# Patient Record
Sex: Male | Born: 1935 | Race: Black or African American | Hispanic: No | Smoking: Former smoker
Health system: Southern US, Community
[De-identification: ages and names within clinical notes are randomized; demographics above are authoritative.]

## PROBLEM LIST (undated history)

## (undated) DIAGNOSIS — N200 Calculus of kidney: Secondary | ICD-10-CM

## (undated) DIAGNOSIS — E785 Hyperlipidemia, unspecified: Secondary | ICD-10-CM

## (undated) DIAGNOSIS — N400 Enlarged prostate without lower urinary tract symptoms: Secondary | ICD-10-CM

## (undated) DIAGNOSIS — R0602 Shortness of breath: Secondary | ICD-10-CM

## (undated) DIAGNOSIS — K5790 Diverticulosis of intestine, part unspecified, without perforation or abscess without bleeding: Secondary | ICD-10-CM

## (undated) DIAGNOSIS — I10 Essential (primary) hypertension: Secondary | ICD-10-CM

## (undated) DIAGNOSIS — I209 Angina pectoris, unspecified: Secondary | ICD-10-CM

## (undated) DIAGNOSIS — M1990 Unspecified osteoarthritis, unspecified site: Secondary | ICD-10-CM

## (undated) DIAGNOSIS — I639 Cerebral infarction, unspecified: Secondary | ICD-10-CM

## (undated) DIAGNOSIS — F17200 Nicotine dependence, unspecified, uncomplicated: Secondary | ICD-10-CM

## (undated) DIAGNOSIS — G4730 Sleep apnea, unspecified: Secondary | ICD-10-CM

## (undated) DIAGNOSIS — Z8249 Family history of ischemic heart disease and other diseases of the circulatory system: Secondary | ICD-10-CM

## (undated) DIAGNOSIS — G709 Myoneural disorder, unspecified: Secondary | ICD-10-CM

## (undated) DIAGNOSIS — J449 Chronic obstructive pulmonary disease, unspecified: Secondary | ICD-10-CM

## (undated) DIAGNOSIS — Z9289 Personal history of other medical treatment: Secondary | ICD-10-CM

## (undated) HISTORY — DX: Cerebral infarction, unspecified: I63.9

## (undated) HISTORY — DX: Family history of ischemic heart disease and other diseases of the circulatory system: Z82.49

## (undated) HISTORY — DX: Benign prostatic hyperplasia without lower urinary tract symptoms: N40.0

## (undated) HISTORY — PX: TRANSURETHRAL RESECTION OF PROSTATE: SHX73

## (undated) HISTORY — DX: Nicotine dependence, unspecified, uncomplicated: F17.200

## (undated) HISTORY — DX: Essential (primary) hypertension: I10

## (undated) HISTORY — DX: Hyperlipidemia, unspecified: E78.5

## (undated) HISTORY — DX: Sleep apnea, unspecified: G47.30

## (undated) HISTORY — DX: Personal history of other medical treatment: Z92.89

## (undated) HISTORY — DX: Chronic obstructive pulmonary disease, unspecified: J44.9

## (undated) HISTORY — DX: Diverticulosis of intestine, part unspecified, without perforation or abscess without bleeding: K57.90

## (undated) HISTORY — PX: TONSILLECTOMY: SUR1361

---

## 1996-08-20 HISTORY — PX: TRANSURETHRAL RESECTION OF PROSTATE: SHX73

## 1998-02-08 ENCOUNTER — Ambulatory Visit: Admission: RE | Admit: 1998-02-08 | Discharge: 1998-02-08 | Payer: Self-pay | Admitting: Pulmonary Disease

## 2000-08-02 ENCOUNTER — Encounter: Payer: Self-pay | Admitting: Emergency Medicine

## 2000-08-02 ENCOUNTER — Emergency Department (HOSPITAL_COMMUNITY): Admission: EM | Admit: 2000-08-02 | Discharge: 2000-08-02 | Payer: Self-pay | Admitting: Emergency Medicine

## 2000-08-11 ENCOUNTER — Inpatient Hospital Stay (HOSPITAL_COMMUNITY): Admission: EM | Admit: 2000-08-11 | Discharge: 2000-08-14 | Payer: Self-pay | Admitting: Neurology

## 2000-08-11 ENCOUNTER — Encounter: Payer: Self-pay | Admitting: Emergency Medicine

## 2000-08-11 ENCOUNTER — Encounter (INDEPENDENT_AMBULATORY_CARE_PROVIDER_SITE_OTHER): Payer: Self-pay | Admitting: *Deleted

## 2000-08-11 ENCOUNTER — Encounter: Payer: Self-pay | Admitting: Neurology

## 2000-08-20 ENCOUNTER — Encounter: Admission: RE | Admit: 2000-08-20 | Discharge: 2000-09-01 | Payer: Self-pay | Admitting: Neurology

## 2001-07-04 ENCOUNTER — Ambulatory Visit (HOSPITAL_COMMUNITY): Admission: RE | Admit: 2001-07-04 | Discharge: 2001-07-04 | Payer: Self-pay | Admitting: Gastroenterology

## 2001-07-04 ENCOUNTER — Encounter (INDEPENDENT_AMBULATORY_CARE_PROVIDER_SITE_OTHER): Payer: Self-pay | Admitting: Specialist

## 2002-07-20 HISTORY — PX: TRANSTHORACIC ECHOCARDIOGRAM: SHX275

## 2003-04-06 ENCOUNTER — Ambulatory Visit (HOSPITAL_COMMUNITY): Admission: RE | Admit: 2003-04-06 | Discharge: 2003-04-06 | Payer: Self-pay | Admitting: Family Medicine

## 2003-08-02 ENCOUNTER — Ambulatory Visit (HOSPITAL_COMMUNITY): Admission: RE | Admit: 2003-08-02 | Discharge: 2003-08-02 | Payer: Self-pay | Admitting: Gastroenterology

## 2004-09-01 ENCOUNTER — Encounter: Admission: RE | Admit: 2004-09-01 | Discharge: 2004-09-01 | Payer: Self-pay | Admitting: Family Medicine

## 2006-08-03 ENCOUNTER — Ambulatory Visit: Payer: Self-pay | Admitting: Family Medicine

## 2006-08-25 ENCOUNTER — Emergency Department (HOSPITAL_COMMUNITY): Admission: EM | Admit: 2006-08-25 | Discharge: 2006-08-26 | Payer: Self-pay | Admitting: Emergency Medicine

## 2006-09-08 ENCOUNTER — Ambulatory Visit: Payer: Self-pay | Admitting: Family Medicine

## 2006-09-13 ENCOUNTER — Encounter: Admission: RE | Admit: 2006-09-13 | Discharge: 2006-09-13 | Payer: Self-pay | Admitting: Family Medicine

## 2006-10-14 ENCOUNTER — Ambulatory Visit: Payer: Self-pay | Admitting: Family Medicine

## 2007-07-11 ENCOUNTER — Ambulatory Visit: Payer: Self-pay | Admitting: Family Medicine

## 2007-07-15 ENCOUNTER — Ambulatory Visit: Payer: Self-pay | Admitting: Family Medicine

## 2007-08-01 ENCOUNTER — Ambulatory Visit: Payer: Self-pay | Admitting: Family Medicine

## 2007-08-08 ENCOUNTER — Ambulatory Visit: Payer: Self-pay | Admitting: Family Medicine

## 2007-09-26 ENCOUNTER — Ambulatory Visit: Payer: Self-pay | Admitting: Family Medicine

## 2008-06-07 ENCOUNTER — Ambulatory Visit: Payer: Self-pay | Admitting: Family Medicine

## 2008-09-14 ENCOUNTER — Ambulatory Visit: Payer: Self-pay | Admitting: Family Medicine

## 2008-11-01 ENCOUNTER — Ambulatory Visit: Payer: Self-pay | Admitting: Family Medicine

## 2009-01-31 ENCOUNTER — Ambulatory Visit: Payer: Self-pay | Admitting: Family Medicine

## 2009-02-04 ENCOUNTER — Encounter: Admission: RE | Admit: 2009-02-04 | Discharge: 2009-02-04 | Payer: Self-pay | Admitting: Family Medicine

## 2009-02-18 ENCOUNTER — Ambulatory Visit: Payer: Self-pay | Admitting: Family Medicine

## 2009-02-18 ENCOUNTER — Encounter: Admission: RE | Admit: 2009-02-18 | Discharge: 2009-02-18 | Payer: Self-pay | Admitting: Family Medicine

## 2009-02-19 ENCOUNTER — Encounter: Admission: RE | Admit: 2009-02-19 | Discharge: 2009-02-19 | Payer: Self-pay | Admitting: Family Medicine

## 2009-10-11 ENCOUNTER — Ambulatory Visit: Payer: Self-pay | Admitting: Family Medicine

## 2010-03-20 DIAGNOSIS — Z9289 Personal history of other medical treatment: Secondary | ICD-10-CM | POA: Insufficient documentation

## 2010-03-20 HISTORY — DX: Personal history of other medical treatment: Z92.89

## 2010-06-09 ENCOUNTER — Ambulatory Visit: Payer: Self-pay | Admitting: Family Medicine

## 2010-10-09 ENCOUNTER — Ambulatory Visit: Payer: Medicare Other | Admitting: Family Medicine

## 2010-10-17 ENCOUNTER — Ambulatory Visit (HOSPITAL_COMMUNITY)
Admission: RE | Admit: 2010-10-17 | Discharge: 2010-10-17 | Disposition: A | Discharge status: Home / Self Care | Payer: Medicare Other | Source: Ambulatory Visit | Attending: Cardiology | Admitting: Cardiology

## 2010-10-17 DIAGNOSIS — R0602 Shortness of breath: Secondary | ICD-10-CM | POA: Insufficient documentation

## 2010-10-19 HISTORY — PX: CARDIAC CATHETERIZATION: SHX172

## 2010-10-22 ENCOUNTER — Ambulatory Visit
Admission: RE | Admit: 2010-10-22 | Discharge: 2010-10-22 | Disposition: A | Discharge status: Home / Self Care | Payer: Medicare Other | Source: Ambulatory Visit | Attending: Cardiology | Admitting: Cardiology

## 2010-10-22 ENCOUNTER — Other Ambulatory Visit: Payer: Self-pay | Admitting: Cardiology

## 2010-10-22 DIAGNOSIS — I2510 Atherosclerotic heart disease of native coronary artery without angina pectoris: Secondary | ICD-10-CM

## 2010-10-22 DIAGNOSIS — R0602 Shortness of breath: Secondary | ICD-10-CM

## 2010-10-28 ENCOUNTER — Ambulatory Visit (HOSPITAL_COMMUNITY)
Admission: RE | Admit: 2010-10-28 | Discharge: 2010-10-28 | Disposition: A | Discharge status: Home / Self Care | Payer: Medicare Other | Source: Ambulatory Visit | Attending: Cardiology | Admitting: Cardiology

## 2010-10-28 DIAGNOSIS — N400 Enlarged prostate without lower urinary tract symptoms: Secondary | ICD-10-CM | POA: Insufficient documentation

## 2010-10-28 DIAGNOSIS — J4489 Other specified chronic obstructive pulmonary disease: Secondary | ICD-10-CM | POA: Insufficient documentation

## 2010-10-28 DIAGNOSIS — J449 Chronic obstructive pulmonary disease, unspecified: Secondary | ICD-10-CM | POA: Insufficient documentation

## 2010-10-28 DIAGNOSIS — R9439 Abnormal result of other cardiovascular function study: Secondary | ICD-10-CM | POA: Insufficient documentation

## 2010-10-28 DIAGNOSIS — R079 Chest pain, unspecified: Secondary | ICD-10-CM | POA: Insufficient documentation

## 2010-10-28 DIAGNOSIS — E785 Hyperlipidemia, unspecified: Secondary | ICD-10-CM | POA: Insufficient documentation

## 2010-10-28 DIAGNOSIS — I10 Essential (primary) hypertension: Secondary | ICD-10-CM | POA: Insufficient documentation

## 2010-10-28 LAB — POCT I-STAT 3, VENOUS BLOOD GAS (G3P V)
Acid-base deficit: 1 mmol/L (ref 0.0–2.0)
O2 Saturation: 62 %
pCO2, Ven: 46.8 mmHg (ref 45.0–50.0)

## 2010-10-28 LAB — POCT I-STAT 3, ART BLOOD GAS (G3+)
Acid-base deficit: 2 mmol/L (ref 0.0–2.0)
Bicarbonate: 24.3 mEq/L — ABNORMAL HIGH (ref 20.0–24.0)

## 2010-11-05 NOTE — Cardiovascular Report (Signed)
NAME:  Curtis Riley, VERBA NO.:  [id number]  MEDICAL RECORD NO.:  [id number]           PATIENT TYPE:  O  LOCATION:  [facility]                         FACILITY:  [facility]  PHYSICIAN:  Thereasa Solo. Little, M.D. DATE OF BIRTH:  1934/12/08  DATE OF PROCEDURE:  10/28/2010 DATE OF DISCHARGE:  10/28/2010                           CARDIAC CATHETERIZATION   INDICATIONS FOR TEST:  This 76 year old hypertensive male has a history of significant COPD and has had atypical chest and arm discomfort.  Had a nuclear study performed that was positive for inferior ischemia raising the concern of RCA disease.  He is brought in for outpatient cardiac catheterization and because of his severe hypertension and COPD, a distal aortogram and a right heart cath was performed.  PROCEDURE:  After obtaining informed consent, the patient was prepped and draped in the usual sterile fashion exposing the right groin. Following local anesthetic with 1% Xylocaine the Seldinger technique was employed and a 5-French introducer sheath was placed in the right femoral artery, a 7-French in the right femoral vein.  A Swan-Ganz catheter was then advanced through its normal route into the pulmonary artery with hemodynamic monitoring undertaken throughout each station, cardiac output by thermodilution was then performed.  Oxygen saturations in both the PA and AO were obtained.  A pigtail catheter was then advanced through its normal route into the left ventricular cavity and ventriculography in the RAO projection and then left and right coronary arteriography was performed.  A distal aortogram done at the level of the renal artery was also performed.  EQUIPMENT:  A 5-French diagnostic Judkins configuration catheter, a 7- French Swan-Ganz catheter.  TOTAL CONTRAST:  90 mL.  COMPLICATIONS:  None.  RESULTS: 1. Hemodynamic monitoring:  Right atrial pressure 11, right     ventricular pressure 14/11.  Pulmonary artery  pressure 26/17, wedge     was 11.  Central aortic pressure 120/72.  Left ventricular pressure     119/7.  Left ventricular end-diastolic pressure was 19.  Oxygen     saturations on room air in the AO 90%, in the PA 62%.  Cardiac     output by thermodilution was 4.5 and by Fick 6.1. 2. Ventriculography.  Ventriculography in the RAO projection using 20     mL of contrast at 12 mL per second revealed mild left ventricular     hypertrophy.  There was ectopy during the ventriculogram.  The     ejection fraction appeared to be 50%.  There was some mitral     regurgitation appeared to be related to the ectopy. 3. Distal aortogram:  Distal aortogram done at the level of renal     arteries using 25 mL of contrast at 12 mL per second revealed no     evidence of renal artery stenosis or abdominal aortic aneurysm.  Coronary arteriography:  On fluoroscopy there was no calcification seen. 1. Left main normal and bifurcated. 2. LAD.  The LAD approached the apex of the heart. To the distal     portion of the LAD was a myocardial bridge and the distal LAD was     relatively  small in diameter compared to the proximal portion which     was about 4 mm.  The first and second diagonal system was free of     disease as was the LAD. 3. Circumflex.  The circumflex in the AV groove was small and free of     disease.  There was a very large bifurcating OM system that was     free of disease. 4. Right coronary artery:  The right coronary artery was normal.  It     gave rise to a large PDA and a small posterolateral vessel.  CONCLUSION: 1. Mild LVH. 2. Normal systolic function. 3. Pulmonary artery pressure of 26/17. 4. No occlusive coronary artery disease.  There was a false positive nuclear study that generated this cath.  He will be maintained on medical therapy for his hypertension.  Discharged to home today with follow-up in the office tomorrow.          ______________________________ Thereasa Solo  Little, M.D.     ABL/MEDQ  D:  10/28/2010  T:  10/29/2010  Job:  [id number]  cc:   Sharlot Gowda, M.D.  Electronically Signed by Julieanne Manson M.D. on 11/05/2010 08:24:38 AM

## 2010-12-05 NOTE — Procedures (Signed)
[place]  Patient:    Curtis Riley, Curtis Riley Visit Number: [id number] MRN: [id number]          Service Type: END Location: ENDO Attending Physician:  Rich Brave Dictated by:   Florencia Reasons, M.D. Proc. Date: 07/04/01 Admit Date:  07/04/2001   CC:         Ronnald Nian, M.D.   Procedure Report  PROCEDURE PERFORMED:  Colonoscopy with polypectomy.  ENDOSCOPIST:  Florencia Reasons, M.D.  INDICATIONS FOR PROCEDURE:  The patient is a 76 year old gentleman, one year status post excision of a very large highly dysplastic polyp from the proximal colon.  FINDINGS:  Small polyps in the distal colon, but no evidence of residual or recurrence at previous polypectomy location.  DESCRIPTION OF PROCEDURE:  The nature, purpose and risks of the procedure were familiar to the patient from prior examination and he provided written consent.  Sedation was fentanyl 50 mcg and Versed 4 mg IV without arrhythmias or desaturation.  The Olympus adult video colonoscope was advanced quite easily to the cecum and pull-back was performed.  The site of the previous polypectomy was identified by the presence of [place] ink but careful inspection several times through that area demonstrated no evidence of recurrent or residual polyp tissue.  It appeared that this area was in the region of the hepatic flexure.  In the transverse colon there was a diminutive 3 mm sessile polyp removed by a couple of cold biopsies and in the left colon at about 42 cm, a pedunculated 6 mm hyperplastic appearing polyp was removed by snare technique, initially decapitating the polyp without cautery, then going back and getting residual tissue with cautery.  There was good hemostasis and no evidence of excessive cautery.  No other problems were seen other than a transiently observed 2 mm sessile lesion in the mid colon which could correspond to the above-mentioned polyp which  was cold biopsied or may have been a separate lesion since we temporarily lost sight of it.  There was no evidence of cancer, colitis or vascular malformations nor did I note any obvious diverticular disease.  The patient tolerated the procedure well and there were no apparent complications.  IMPRESSION:  Colon polyps removed as described above.  However, the area of greatest concern, namely the site of the previous polypectomy, appeared to be free of any polypoid tissue.  PLAN:  Await pathology on current biopsies.  Would anticipate follow-up colonoscopy in about a year for continued close monitoring of this patient and then perhaps follow-up could be spaced out to longer intervals depending on how he is doing. Dictated by:   Florencia Reasons, M.D. Attending Physician:  Rich Brave DD:  07/04/01 TD:  07/04/01 Job: [id number]

## 2010-12-05 NOTE — Consult Note (Signed)
[place]  Patient:    CHERYL, STABENOW                      MRN: [id number] Proc. Date: 08/11/00 Adm. Date:  [id number] Disc. Date: [id number] Attending:  Ilene Qua CC:         Marlan Palau, M.D.  Ronnald Nian, M.D.   Consultation Report  Dr. Susann Givens asked me to see this 76 year old African-American male because of heme positive stool and anemia.  Mr. Minniefield came to the emergency room today as he did eight days ago because of severe dizziness forcing him to lie down.  During his evaluation last time and again on this occasion, he was found to be severely anemic and the ER physicians rectal examination today showed the stool to be weakly Hemoccult positive (he was Hemoccult negative when checked by Dr. Susann Givens in his office following the recent ER visit).  From the GI perspective, pertinent to the patients history is the fact that he was seen by me consultatively for Dr. Susann Givens about three and a half years ago in May of 1998 because of several small polyps identified on a screening flexible sigmoidoscopy performed by Dr. Susann Givens.  At that time the patient had had a significant 24 pound weight loss.  Colonoscopy for the purpose of polypectomy was going to be arranged but the patient did not follow through on it as instructed and despite letters urging him to do so, he never got back in touch with [place].  Blood work obtained through Dr. Levonne Spiller office was pertinent for a low ferritin level of 2, a low iron saturation (serum iron was less than 10), a hemoglobin of 7.7, an MCV of 70.5, and an RDW of 21 with a normal platelet count.  The patient is not on any ______ medications such as aspirin or NSAIDs, although he does use occasional Tylenol.  He has no upper or lower tract symptoms.  Several years ago he had significant heartburn but it went away on its own.  He does not have any problem with anorexia or current weight loss to his  knowledge despite the previous history of weight loss nor any symptoms of belching, dysphagia, nor lower tract symptomatology such as melena, hematochezia, constipation, diarrhea, or change in bowel habits.  There is no known family history of colon cancer, although a brother did have ulcers and two of his brothers died with some sort of cancer, type unknown.  In summary, the patient has asymptomatic microcytic severe anemia noted incidentally during evaluation for spells of dizziness and associated with weakly heme positive stool on todays examination, but without any obvious localizing GI tract symptoms or worrisome family history, just his personal history of colon polyps several years ago.  It was initially felt that the patients dizziness was related to his anemia and for that reason the initial request was for me to manage the patients care.  However, during the course of my evaluation of the patient, I noted that the patient was neurologically not intact.  Specifically, his left eye could not cross the midline looking toward the right and this was associated with symptoms of diplopia which was causing the patient to keep his eyes closed.  This, along with his persistent dizziness which had begun around 9:00 last night, made me feel that the heme positive anemia was an incidental problem and that the main reason for the patients complaints of dizziness (further description below)  were related to some sort of neurologic process, perhaps some variation of vertebrobasilar insufficiency or other brain stem problem.  Accordingly, neurologic consultation was obtained and the patient was seen by Dr. Lesia Sago who has kindly agreed to have the patient admitted to the [facility] stroke service (the patient was seen consultatively by himself and by myself at the [facility] where he had presented).  ALLERGIES:  PENICILLIN ("severe reaction" when he was in the  [employer] many years ago characterized by hives and apparently a drop in blood pressure).  MEDICATIONS:  None other than occasional Tylenol and some recent antibiotics for a cold.  PAST SURGICAL HISTORY:  TURP with possible removal of kidney stones several years ago.  PAST MEDICAL HISTORY:  Basically none.  The patient thinks he might have some mild COPD but he is not under treatment for this.  No known coronary disease. No history of ulcers, diabetes, or hypertension.  He does have the history of colon polyps identified on flexible sigmoidoscopy by Dr. Susann Givens several years ago for which the patient never followed through to have them removed.  SOCIAL HISTORY:  The patient currently smokes less than one-half pack per day, but used to smoke as much as perhaps one pack per day since age 45.  He drank moderately in his younger years, but has not had any ethanol for the past 20 years.  The patient lives by himself.  He has a daughter, Doreene Burke, who helps to look after him.  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  He is originally from [place] in the [place].  However, he has lived in other areas, particularly in [place] since getting out of the [employer].  FAMILY HISTORY:  Negative for colon cancer, gallstones, or liver disease. There is peptic ulcer in one of his older brothers and it sounds as though two brothers died of some form of cancer.  His father died of old age, his mother of some sort of postoperative complications in the hospital.  REVIEW OF SYSTEMS:  As noted above, the patient has had a spell of dizziness bringing him to the emergency room eight days ago and again another one that began around 9:00 last night.  He has had occasional spells, perhaps every five or six years which are somewhat similar to this but what was  different  about the current episode is that he developed tunnel vision and also a headache.  The headache is in the vertex of his head.  Associated symptoms include a rushing or ringing sound in his ears somewhat like the ocean heard through a seashell.  These spells come on subacutely.  He has a brief aura that they are coming on which allowed him, for example, to pull his truck into the parking lot eight days ago when he had his last spell.  In between these episodes, he has felt neurologically fairly normal and in fact was back to work yesterday for the first time since the episode eight days ago.  He did feel somewhat weak but this was attributed to his known anemia.  Last night he had some brief chest tightness but it only lasted a couple of minutes and he does not have any exertional chest pain.  HEART:  Negative for palpitations or recurring chest pain.  LUNGS:  He ordinarily has no chronic cough  or severe dyspnea but it is hard for him to go more than three to four flights of stairs without huffing and puffing.  Recently, he had a chest cold with some phlegm production which got better after antibiotics.  ABDOMEN:  Please see the HPI. He is basically asymptomatic for upper or lower tract symptoms.  PHYSICAL EXAMINATION:  GENERAL:  This is a pleasant, talkative, coherent African-American male with his eyes shut lying on the stretcher in no evident distress.  VITAL SIGNS:  Unremarkable without hypotension, but with a significant systolic drop on orthostatic measurements.  However, pulse and diastolic show no significant change.  HEENT:  This is pertinent for disconjugate gaze.  Specifically, there is paresis of extraocular muscle movements (see below under neurologic examination).  He is anicteric.  The oropharynx is benign.  The thyroid gland is normal.  CHEST:  Clear to auscultation.  No wheezes heard.  However, some soft upper airway type wheezes are heard when he  converses.  HEART:  No gallops, rubs, murmurs, clicks, or arrhythmias.  ABDOMEN:  Normal liver span.  No organomegaly, guarding, mass, tenderness, ______.  Moderate tympany.  RECTAL:  This was not performed by me but the emergency room examination showed weakly heme positive stool.  EXTREMITIES:  No edema.  NEUROLOGIC:  The patient is coherent and without dysarthria.  He does heel-to-shin motions normally.  However, cranial nerve deficits are noted in that he has impairment of rightward gaze past the midline in his left eye.  I do not note any definite nystagmus.  Facial nerves and hearing are intact.  LABORATORIES:  White count 7200, hemoglobin 7.8, MCV 62, RDW 18, platelets 402,000.  See above regarding iron studies obtained through Dr. Levonne Spiller office.  CMET entirely normal.  Head CT scan negative.  IMPRESSION: 1. Acute change in neurologic status characterized by dizziness and    extraocular muscle dysfunction raising the question of brain stem or    vertebrobasilar problems. 2. Heme positive stool, microcytic anemia without worrisome gastrointestinal    symptoms or ulcer risk factors. 3. Previous history of colon polyps on sigmoidoscopy not removed    colonoscopically due to lack of patient followup.  PLAN: 1. The patient is being admitted to the stroke service as noted above. 2. When okay with neurology, I would be happy to perform elective GI workup.    There is no evidence of acute GI bleeding to necessitate urgent endoscopic    evaluation. 3. For the same reason, I have no objection to anticoagulation if that is felt    to be needed.  Specifically, the patient has no history of ulcer disease or    current ulcer risk factors (other than mild smoking) and does not show    strongly heme positive stool or evidence of an acute drop in hemoglobin.    Rather, this is a chronic anemia and I worry about occult malignancy,    especially in the colon.  For that reason, I do  not feel the patient would    be at risk for brisk bleeding if he were to undergo anticoagulation.  I appreciate the opportunity to have seen this patient in consultation for you. DD:  08/11/00 TD:  08/11/00 Job: [id number]

## 2010-12-05 NOTE — Discharge Summary (Signed)
[place]. [facility]  Patient:    Curtis Riley, Curtis Riley                      MRN: [id number] Adm. Date:  [id number] Disc. Date: [id number] Attending:  Lesly Dukes CC:         Ronnald Nian, M.D.  Florencia Reasons, M.D.   Discharge Summary  DATE OF BIRTH:  15-Oct-1934  ADMITTING DIAGNOSES: 1. 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 2. History of tobacco abuse. 3. Iron deficiency anemia.  DISCHARGE DIAGNOSES: 1. 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 2. Large colon polyp, status post removal. 3. Iron deficiency anemia presumed secondary to #2. 4. History of tobacco abuse.  CONDITION ON DISCHARGE:  Good.  DISCHARGE DIET:  Low salt, low fat, low cholesterol, and high fiber.  ACTIVITY:  Ad lib.  DISCHARGE MEDICATIONS: 1. Enteric-coated aspirin 325 mg q.d. 2. Multivitamin with iron q.d.  PROCEDURES: 1. MRI of the brain performed August 11, 2000, revealing an acute ischemic    infarct involving the left medial longitudinal fasciculi extending to the    left red nucleus and mild small vessel ischemic-type changes. 2. MRA of the brain performed August 11, 2000, revealing mild narrowing of    left carotid bulb but otherwise no significant lesions. 3. Echocardiogram performed August 12, 2000, revealing mild dilatation of    left ventricle and mildly decreased left systolic function with ejection    fraction of 46% with no focal wall motion abnormalities. 4. Colonoscopy performed August 13, 2000, revealing an approximately 5 cm    polyp in the ascending colon, which was removed, as well as a 1 cm polyp in    the sigmoid area which was removed, an 8 mm polyp at approximately 45 cm    seen but not removed, and scattered diverticula.  Pathology on the polyps    is pending. 5. Esophagogastroduodenoscopy performed August 13, 2000, revealing no    abnormalities.  HOSPITAL COURSE:   Please see admission H&P for full details.  Briefly, this is a 76 year old man who presented to the emergency room with acute onset of diplopia on examination by Dr. Anne Hahn.  At admission, he was found to have a left intranuclear ophthalmoplegia with no other evident neurological abnormalities.  His admission labs revealed anemia thought to be iron deficiency by indices which was previously known.  He was admitted to the hospital and placed on aspirin and Pepcid.  His double-vision improved somewhat during his hospitalization.  Physical therapy was consulted and assisted with great transfers and felt that he could benefit from outpatient therapy.  His gastroenterologist, Dr. Matthias Hughs, was consulted.  Of note, the patient had a history of polyps on sigmoidoscopy in 1998 but did not follow up subsequent colonoscopy.  Due to his anemia and acute stroke, the patient was transfused with two units of packed red blood cells with improvement in his hemoglobin from 7.7 on admission to 9.0 on August 13, 2000.  Because of the question of anemia contributing to his acute stroke, it was felt appropriate to work-up the anemia during his hospitalization; thus he underwent EGD and colonoscopy by Dr. Matthias Hughs on August 13, 2000, with the above results, and it was felt that the large colon polyp was the most likely source of his chronic blood loss and subsequent anemia.  He tolerated the procedures well and on the morning of January 26, had no complaints.  He was given an eye patch for relief of his diplopia, and this helped.  He was deemed stable and was discharged on the morning of August 14, 2000.  He will follow up with his primary physician, Dr. Susann Givens, next week as well as with Dr. Matthias Hughs, as scheduled to follow up on the pathology results for his colonoscopy.  He was given prescriptions for outpatient physical and occupational therapy to assist with gait and high-level balance functions.  He will  follow up with Demetrio Lapping, P.A. and Dr. Kelli Hope at [facility] in 3-4 weeks. DD:  08/14/00 TD:  08/14/00 Job: [id number]

## 2010-12-05 NOTE — Procedures (Signed)
[place]. [facility]  Patient:    Curtis Riley, Curtis Riley                      MRN: [id number] Proc. Date: 08/13/00 Adm. Date:  [id number] Disc. Date: [id number] Attending:  Lesly Dukes CC:         Ronnald Nian, M.D.  Marlan Palau, M.D.   Procedure Report  PROCEDURE PERFORMED:  Colonoscopy with polypectomy.  INDICATIONS:  A 76 year old African-American male with polyps seen on screening flexible sigmoidoscopy by his primary physician about 3-1/2 years ago.  Colonoscopy was arranged but never followed through on by the patient and as a consequence the polyps were never removed.  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  He now is to undergo colonoscopy to look for a source of the anemia in the lower GI tract.  His upper endoscopy was negative.  FINDINGS: Huge polyp removed from the proximal colon.  DESCRIPTION OF PROCEDURE:  The nature purpose and risks of the procedure had been discussed with the patient who provided written consent.  Sedation for his procedure and the upper endoscopy which preceded it total of Fentanyl 50 mcg and Versed 6 mg IV without arrhythmias or desaturation.  The Olympus pediatric adjustable tension video colonoscope was advanced to the cecum following an unremarkable digital rectal exam.  It was fairly easy to reach the cecum.  Pull back was then performed.  The quality of the prep was very good and it was felt that all areas were well seen.  The main finding on this exam was a huge polyp in the proximal ascending colon approximately 4 haustrations above the cecum.  Initially, it appeared this was going to have to be removed surgically but as I examined it, it had a stalk which was somewhat short but not particularly thick and it was felt that the polyp could probably be  removed with reasonable safety.  I therefore injected the stalk with 1 cm of 1:10,000 epinephrine which caused nice blanching and raised a bleb to help "elevate" the stalk.  The polyp was then carved off piece meal, with two or three large pieces and then approximately six smaller pieces.  There was complete hemostasis and no evidence of excessive cautery at the polypectomy site and, when finished there was very little if any residual polyps tissue.  Nearby was a tiny sessile polyp which I elected not to attempt to remove at the present time.  The polyp was extracted out through the rectum using the [facility] retrieval basket in approximately three separate trips around the colon.  There was a much smaller, roughly 8 mm polyp on a short stalk near the splenic flexure at about 45 cm which was seen transiently but unable to be snared due to an eccentric orientation in the colon.  As we torqued the scope and attempted to reposition, we lost site of the polyp and never saw it again so it was never removed.  There was a 1 cm polyp at about 25 cm removed by snare technique.  It was on a short stalk and there was complete hemostasis and no evidence of excessive cautery.  There was moderate scattered diverticulosis around the colon.  The patient tolerated this procedure very well and there were no evident immediate complications.  IMPRESSION: 1. Huge polyp in the proximal ascending colon removed  piece meal as    described above, plus smaller sigmoid polyp snared and smaller polyp    near the splenic flexure not removed. 2. Scattered diverticulosis.  PLAN: 1. The patient will be observed for complications and I have carefully    instructed the patients wife and daughter in thinks to watch for (pain,    fever, dark stools, or evidence of bleeding) and they recognize that these    complications can be delayed. 2. I would maintain the patient on aspirin in view of his recent brain stem     stroke, even though it might slightly increase his risk of post    polypectomy bleeding.  The patients family understands this. 3. The patient will need a relatively early follow up colonoscopy, probably    about six months from now. 4. Await pathology on the polyp sections. DD:  08/13/00 TD:  08/14/00 Job: [id number]

## 2010-12-05 NOTE — Op Note (Signed)
NAME:  Curtis Riley, Curtis Riley                         ACCOUNT NO.:  [id number]   MEDICAL RECORD NO.:  [id number]                   PATIENT TYPE:  AMB   LOCATION:  ENDO                                 FACILITY:  [facility]   PHYSICIAN:  Bernette Redbird, M.D.                DATE OF BIRTH:  [birth year]   DATE OF PROCEDURE:  08/02/2003  DATE OF DISCHARGE:                                 OPERATIVE REPORT   PROCEDURE:  Colonoscopy.   INDICATION:  This is a 76 year old gentleman who is about three years status  post colonoscopic removal of a large highly dysplastic polyp from the  ascending colon.  Surveillance colonoscopy a year later (two years ago)  showed a couple of small adenomas in the colon, but no evidence of local  recurrence of the polyp at the previous polypectomy location.   FINDINGS:  Right-sided diverticulosis, otherwise normal exam.   PROCEDURE:  The nature, purpose, and risks of the procedure were familiar to  the patient who provided written consent.  Sedation was Fentanyl 50 mcg and  Versed 5 mg IV without arrhythmias or desaturation.   The Olympus adult video-colonoscope was readily advanced to the cecum and  the tip was nubbed into the orifice of the terminal ileum, although the TI  was not freely cannulated.  Pullback was then performed.   The polypectomy site from before had been tattooed and the area was readily  apparent.  The entire area was carefully checked, but no evidence of  residual polyp tissue could be seen.  There were no polyps anywhere in the  colon.  No evidence of cancer, colitis, or vascular malformations, but there  was a little bit of right-sided diverticulosis.  Retroflexion in the rectum  was unremarkable.   This was a normal examination.  No polyps, cancer, vascular malformations,  or diverticulosis were noted.  Retroflexion in the rectum was normal.  Re-  inspection of the rectum was normal.  No biopsies were obtained.  The  patient tolerated the  procedure well and there were no apparent  complications.   No biopsies were obtained.  The patient tolerated the procedure well and  there were no apparent complications.   IMPRESSION:  1. Prior history of large, highly dysplastic colonic adenoma, without     recurrence at this time (V12.72).  2. Minimal right-sided diverticulosis.   PLAN:  Followup colonoscopy in 2 years with anticipated increase in  subsequent intervals if that exam is negative.                                               Bernette Redbird, M.D.    RB/MEDQ  D:  08/02/2003  T:  08/02/2003  Job:  [id number]   cc:   Sharlot Gowda, M.D.  [address]  [place], [place] [address]  Fax: [phone]

## 2010-12-05 NOTE — H&P (Signed)
[place]. [facility]  Patient:    Curtis Riley, Curtis Riley                      MRN: [id number] Adm. Date:  [id number] Disc. Date: [id number] Attending:  Ilene Qua CC:         Ronnald Nian, M.D.  Florencia Reasons, M.D.  [facility], [address], [place], [place]   History and Physical  CHIEF COMPLAINT: This patient is an admission neurology patient seen at [facility] Emergency Room for evaluation for double vision on August 11, 2000.  HISTORY OF PRESENT ILLNESS: Curtis Riley is a 76 year old left handed black male, born 1934-10-05, with a history of tobacco abuse and COPD.  This patient presents with a history of anemia, seen by Dr. Matthias Hughs, and has had reduction in hemoglobin from 8.1 to 7.8 range at this point.  MCV is low, suggesting chronic component to the anemia.  The patient reports an episode that occurred eight days ago, associated with some headache and tinnitus, tunnel vision temporarily, and then onset of double vision.  With the double vision was accompanying gait instability that lasted up to about 1-1/2 days after the event, with clearing at that point.  The patient reported no other associated symptoms such as focal numbness or weakness noted of the face, arms, or legs, slurred speech, or loss of consciousness.  The patient had a recurrence of this same type spell last night around 8:30 p.m. after eating dinner.  The patient states he was watching television and had onset of headache, double vision, tunnel vision, and had tinnitus.  Later on the patient experienced nausea and vomiting.  The headache has disappeared but the patient has had retention of double vision and gait instability problems. During evaluation today in the emergency room the patient was noted to have diversion of gaze and a CT scan of the head was ordered, and this showed no acute changes.  This was done without contrast only.   The neurology service was asked to see this patient for further evaluation at this point.  PAST MEDICAL HISTORY:  1. New onset of left inner nuclear ophthalmoplegia by examination, rule out     small vessel disease involving the brain stem and pons or mid brain; rule     out vertebrobasilar insufficiency.  2. Status post TURP for benign prostatic hypertrophy.  3. History of anemia, probable lower GI bleed.  4. History of bladder stones, status post resection.  5. History of COPD, with tobacco abuse.  CURRENT MEDICATIONS: Multivitamin with iron.  ALLERGIES: PENICILLIN.  SOCIAL HISTORY: The patient smokes half a packs of cigarettes per day and does not drink alcohol.  [place] and is divorced, and has two children.  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  FAMILY HISTORY: His mother died following surgery, postoperative death.  His father died of old age.  The patient has 11 brothers and sisters - seven are alive and four have passed away.  One sister with cancer, one sister with diabetes, two brothers with cancer of some sort.  REVIEW OF SYSTEMS: Notable for no recent problems with headaches until the above noted events.  The patient has had some fevers off and on with cold-like symptoms the last couple of weeks and the patient has recently been on some antibiotics.  The patient has had no chest pain.  He did have some nausea and vomiting last evening.  He denies problems controlling bowel or bladder and denies any confusion episodes, blacking out episodes, or seizures.  The gait problems seem to correlate with the double vision problem.  PHYSICAL EXAMINATION:  VITAL SIGNS: Blood pressure 124/89l, heart rate 78, respiratory rate.  The patient is afebrile.  GENERAL: This patient is a fairly well-developed black male who is alert and cooperative at the time of examination.  HEENT: Head atraumatic.  PERRL.   Discs are flat bilaterally.  NECK: Supple.  No carotid bruits noted.  RESPIRATORY: Examination clear to auscultation and percussion.  CARDIOVASCULAR: Examination reveals a regular rate and rhythm without obvious murmurs or rubs noted.  ABDOMEN: Examination reveals positive bowel sounds.  No organomegaly or tenderness is noted.  EXTREMITIES: Without significant edema.  NEUROLOGIC: Cranial nerves as above.  Facial symmetry is present.  The patient has good sensation of the face to pinprick and soft touch bilaterally.  The patient has clear evidence of diverted gaze with evidence of a nearly complete left inner nuclear ophthalmoplegia.  The patient has some downbeat nystagmus at times as well.  The patient has a normal speech pattern with no aphasia or dysarthria.  The tongue is midline.  Positive gag reflex is noted.  Motor testing reveals 5/5 strength of all four extremities.  Good symmetric motor tone is noted throughout.  Sensory testing is intact to pinprick, soft touch, and vibratory sensation throughout.  Deep tendon reflexes are present and symmetric throughout.  The toes are neutral bilaterally.  The patient has fair finger-to-nose and finger-to-toe testing bilaterally.  He is not ambulated.  LABORATORY DATA: CT scan of the head is unremarkable without contrast.  Laboratory values are notable for a WBC of 7.2, hemoglobin 7.8, hematocrit 25.9; MCV 52.0; platelets 402,000.  Sodium was 143, potassium 4.3, chloride 109, CO2 29, glucose 100, BUN 9, creatinine 1.0.  Calcium was 9.3, total protein 7.1, albumin 3.6, AST 19, ALT 12, alkaline phosphatase 75, bilirubin 0.6.  EKG and chest x-ray are pending.  IMPRESSION:  1. New onset of intranuclear ophthalmoplegia on the left, probable small     vessel pons or mid brain stroke.  2. History of tobacco abuse.  Given this patients history of some generalized visual loss as well as  dizziness and nausea and vomiting we do need to  also consider the possibility of vertebrobasilar insufficiency.  The patients examination, however, supports what appears to be a very small brain stem stroke, likely a small vessel ischemic event.  Other than the inner internuclear ophthalmoplegia, this patients examination is essentially unremarkable.  We will need to rule out vertebrobasilar insufficiency, however.  PLAN:  1. Admit this patient to the stroke service at [facility].  2. Aspirin therapy for now.  Dr. Matthias Hughs has indicated that heparin can be     used if needed.  3. MRI scan of the brain.  4. MR angiogram.  5. Obtain 2D echocardiogram.  6. Physical therapy for gait.  7. Transfusion of two units of packed red blood cells.  8. Will follow clinical course while in-house. DD:  08/11/00 TD:  08/11/00 Job: [id number]

## 2010-12-05 NOTE — Procedures (Signed)
[place]. [facility]  Patient:    Curtis Riley, Curtis Riley                      MRN: [id number] Proc. Date: 08/13/00 Adm. Date:  [id number] Disc. Date: [id number] Attending:  Lesly Dukes CC:         Ronnald Nian, M.D.  Marlan Palau, M.D.   Procedure Report  PROCEDURE PERFORMED:  Upper endoscopy  INDICATIONS:  A 76 year old male with hemoccult positive stool and iron deficiency anemia.  FINDINGS:  Normal exam.  CONSENT:  The nature, purpose and risks of the procedure had been discussed with the patient who provided written consent.  SEDATION:  Fentanyl 50 mcg and Versed 5 mg IV without arrhythmias or desaturation.  DESCRIPTION OF PROCEDURE:  The Olympus small caliber adult video endoscope was passed under direct vision.  The larynx looked grossly normal.  The esophagus was easily entered and was endoscopically normal without evidence of reflux esophagitis, Barretts esophagus, varices, infection or neoplasia. No ring, stricture or hiatal hernia was appreciated.  The stomach was entered and contained no significant residual.  The mucosa was normal, without evidence of gastritis, erosions, ulcers, polyps or masses.  The pylorus, duodenal bulb and second duodenum looked normal.  The scope was removed from the patient who tolerated the procedure well. There were no apparent complications.  IMPRESSION:  Normal endoscopy.  No source of iron deficiency anemia or hemoccult positive stool endoscopically evident.  PLAN:  Proceed to colonoscopic evaluation. DD:  08/13/00 TD:  08/14/00 Job: [id number]

## 2011-03-24 ENCOUNTER — Encounter: Payer: Self-pay | Admitting: Family Medicine

## 2011-03-26 ENCOUNTER — Ambulatory Visit (INDEPENDENT_AMBULATORY_CARE_PROVIDER_SITE_OTHER): Payer: Medicare Other | Admitting: Family Medicine

## 2011-03-26 ENCOUNTER — Encounter: Payer: Self-pay | Admitting: Family Medicine

## 2011-03-26 VITALS — BP 130/80 | HR 74 | Wt 259.0 lb

## 2011-03-26 DIAGNOSIS — R0602 Shortness of breath: Secondary | ICD-10-CM

## 2011-03-26 DIAGNOSIS — Z79899 Other long term (current) drug therapy: Secondary | ICD-10-CM

## 2011-03-26 DIAGNOSIS — J449 Chronic obstructive pulmonary disease, unspecified: Secondary | ICD-10-CM

## 2011-03-26 DIAGNOSIS — I10 Essential (primary) hypertension: Secondary | ICD-10-CM

## 2011-03-26 LAB — CBC WITH DIFFERENTIAL/PLATELET
Eosinophils Relative: 2 % (ref 0–5)
HCT: 43.9 % (ref 39.0–52.0)
Hemoglobin: 14.1 g/dL (ref 13.0–17.0)
MCH: 30.9 pg (ref 26.0–34.0)
MCHC: 32.1 g/dL (ref 30.0–36.0)
MCV: 96.3 fL (ref 78.0–100.0)
Neutrophils Relative %: 66 % (ref 43–77)
Platelets: 185 10*3/uL (ref 150–400)
RBC: 4.56 MIL/uL (ref 4.22–5.81)
RDW: 12.6 % (ref 11.5–15.5)

## 2011-03-26 LAB — COMPREHENSIVE METABOLIC PANEL
BUN: 11 mg/dL (ref 6–23)
Chloride: 105 mEq/L (ref 96–112)
Creat: 1.2 mg/dL (ref 0.50–1.35)
Potassium: 4.5 mEq/L (ref 3.5–5.3)
Sodium: 140 mEq/L (ref 135–145)

## 2011-03-26 NOTE — Progress Notes (Signed)
  Subjective:    Patient ID: Curtis Riley, male    DOB: [birth year], 76 y.o.   MRN: [id number]  HPI He is here for evaluation of worsening difficulty with breathing over the last several months. He did note that heat made it worse. He then started walking earlier in the morning but noted that he, without vigorous physical activity he would become more short of breath with some wheezing but no chest pain. He notes peripheral edema as well as PND. He was seen by cardiology with a catheterization done in April of this year. He continues on Combivent. He made an appointment to see Dr. Clarene Duke on September 20   Review of Systems     Objective:   Physical Exam Alert and in no distress. Cardiac exam shows regular rhythm without murmurs or gallops. Lungs are clear to auscultation. ME exam shows minimal edema       Assessment & Plan:  Shortness of breath with PND; possible cardiac origin. EKG, blood work, chest x-ray. Flu shot was offered however he refused

## 2011-03-27 ENCOUNTER — Telehealth: Payer: Self-pay

## 2011-03-27 ENCOUNTER — Ambulatory Visit
Admission: RE | Admit: 2011-03-27 | Discharge: 2011-03-27 | Disposition: A | Discharge status: Home / Self Care | Payer: Medicare Other | Source: Ambulatory Visit | Attending: Family Medicine | Admitting: Family Medicine

## 2011-03-27 DIAGNOSIS — R0602 Shortness of breath: Secondary | ICD-10-CM

## 2011-03-27 NOTE — Telephone Encounter (Signed)
Called pt know labs ok and faxed info to Dr.Little

## 2011-03-27 NOTE — Telephone Encounter (Signed)
Left message x ray ok

## 2011-05-12 ENCOUNTER — Other Ambulatory Visit: Payer: Self-pay | Admitting: Gastroenterology

## 2011-06-04 LAB — HM COLONOSCOPY

## 2011-08-04 ENCOUNTER — Ambulatory Visit (INDEPENDENT_AMBULATORY_CARE_PROVIDER_SITE_OTHER): Payer: Medicare Other | Admitting: Family Medicine

## 2011-08-04 ENCOUNTER — Encounter: Payer: Self-pay | Admitting: Family Medicine

## 2011-08-04 VITALS — BP 130/88 | HR 76 | Wt 258.0 lb

## 2011-08-04 DIAGNOSIS — Z23 Encounter for immunization: Secondary | ICD-10-CM

## 2011-08-04 DIAGNOSIS — M549 Dorsalgia, unspecified: Secondary | ICD-10-CM

## 2011-08-04 DIAGNOSIS — G8929 Other chronic pain: Secondary | ICD-10-CM

## 2011-08-04 NOTE — Progress Notes (Signed)
  Subjective:    Patient ID: Curtis Riley, male    DOB: [birth year], 77 y.o.   MRN: [id number]  HPI He is here for consult concerning continued difficulty with back pain. He has had difficulty since the 1970s and is being followed at the [place]. He has had a workup including MRI which did show some nonspecific changes. They recommended either injections or surgery. Apparently the back pain is intermittent and at this time is responding to over-the-counter medications.   Review of Systems     Objective:   Physical Exam Alert and in no distress otherwise not examined       Assessment & Plan:   1. Chronic back pain    over 15 minutes was spent discussing back pain therapy with him. I recommended he continue to treat his pain with over-the-counter medications and if no benefit, try epidural injections and definitely at this time spoke against any surgical intervention. He is comfortable with that approach.

## 2011-09-15 ENCOUNTER — Ambulatory Visit (INDEPENDENT_AMBULATORY_CARE_PROVIDER_SITE_OTHER): Payer: Medicare Other | Admitting: Family Medicine

## 2011-09-15 ENCOUNTER — Encounter: Payer: Self-pay | Admitting: Family Medicine

## 2011-09-15 VITALS — BP 142/88 | HR 58 | Ht 72.0 in | Wt 254.0 lb

## 2011-09-15 DIAGNOSIS — M542 Cervicalgia: Secondary | ICD-10-CM

## 2011-09-15 NOTE — Progress Notes (Signed)
  Subjective:    Patient ID: Curtis Riley, male    DOB: 06-Sep-1934, 77 y.o.   MRN: [id number]  HPI He is here for evaluation of difficulty with his left shoulder. He states that he is unable to lift his left shoulder. He has been complaining of neck pain recently but no history of injury. He does have a previous history of cervical disc disease. An MRI was done in 2010. He was sent for physical therapy which did help.   Review of Systems     Objective:   Physical Exam Good motion of his neck with no tenderness to palpation. Deltoid muscle on the left is quite weak. Biceps testing is also weak. Normal motion of the shoulder.       Assessment & Plan:  Neck pain with neurologic deficit. An MRI will be ordered.

## 2011-09-19 ENCOUNTER — Ambulatory Visit
Admission: RE | Admit: 2011-09-19 | Discharge: 2011-09-19 | Disposition: A | Discharge status: Home / Self Care | Payer: Medicare Other | Source: Ambulatory Visit | Attending: Family Medicine | Admitting: Family Medicine

## 2011-09-19 DIAGNOSIS — M5030 Other cervical disc degeneration, unspecified cervical region: Secondary | ICD-10-CM | POA: Diagnosis not present

## 2011-09-19 DIAGNOSIS — M47812 Spondylosis without myelopathy or radiculopathy, cervical region: Secondary | ICD-10-CM | POA: Diagnosis not present

## 2011-09-19 DIAGNOSIS — M542 Cervicalgia: Secondary | ICD-10-CM

## 2011-09-19 DIAGNOSIS — M5020 Other cervical disc displacement, unspecified cervical region: Secondary | ICD-10-CM | POA: Diagnosis not present

## 2011-09-25 ENCOUNTER — Telehealth: Payer: Self-pay | Admitting: Internal Medicine

## 2011-09-25 DIAGNOSIS — M5000 Cervical disc disorder with myelopathy, unspecified cervical region: Secondary | ICD-10-CM | POA: Diagnosis not present

## 2011-09-28 DIAGNOSIS — M5000 Cervical disc disorder with myelopathy, unspecified cervical region: Secondary | ICD-10-CM | POA: Diagnosis not present

## 2011-09-29 DIAGNOSIS — G4733 Obstructive sleep apnea (adult) (pediatric): Secondary | ICD-10-CM | POA: Diagnosis not present

## 2011-09-29 DIAGNOSIS — Z01810 Encounter for preprocedural cardiovascular examination: Secondary | ICD-10-CM | POA: Diagnosis not present

## 2011-09-29 DIAGNOSIS — I10 Essential (primary) hypertension: Secondary | ICD-10-CM | POA: Diagnosis not present

## 2011-09-29 DIAGNOSIS — J449 Chronic obstructive pulmonary disease, unspecified: Secondary | ICD-10-CM | POA: Diagnosis not present

## 2011-09-30 ENCOUNTER — Encounter (HOSPITAL_COMMUNITY)
Admission: RE | Admit: 2011-09-30 | Discharge: 2011-09-30 | Disposition: A | Discharge status: Home / Self Care | Payer: Medicare Other | Source: Ambulatory Visit | Attending: Neurosurgery | Admitting: Neurosurgery

## 2011-09-30 ENCOUNTER — Encounter (HOSPITAL_COMMUNITY): Payer: Self-pay

## 2011-09-30 HISTORY — DX: Unspecified osteoarthritis, unspecified site: M19.90

## 2011-09-30 HISTORY — DX: Shortness of breath: R06.02

## 2011-09-30 HISTORY — DX: Angina pectoris, unspecified: I20.9

## 2011-09-30 HISTORY — DX: Myoneural disorder, unspecified: G70.9

## 2011-09-30 HISTORY — DX: Calculus of kidney: N20.0

## 2011-09-30 LAB — CBC
HCT: 44.1 % (ref 39.0–52.0)
MCHC: 33.1 g/dL (ref 30.0–36.0)
RDW: 12 % (ref 11.5–15.5)

## 2011-09-30 LAB — BASIC METABOLIC PANEL
BUN: 10 mg/dL (ref 6–23)
Chloride: 107 mEq/L (ref 96–112)
Creatinine, Ser: 0.99 mg/dL (ref 0.50–1.35)
GFR calc Af Amer: >90 mL/min (ref >90)

## 2011-09-30 LAB — SURGICAL PCR SCREEN: MRSA, PCR: NEGATIVE

## 2011-09-30 MED ORDER — VANCOMYCIN HCL 500 MG IV SOLR: 500.0000 mg | INTRAVENOUS | Status: DC

## 2011-09-30 MED ORDER — TOBRAMYCIN SULFATE 80 MG/2ML IJ SOLN: 80.0000 mg | Freq: Once | INTRAVENOUS | Status: DC

## 2011-09-30 MED ORDER — DIPHENHYDRAMINE HCL 50 MG/ML IJ SOLN: 50.0000 mg | Freq: Once | INTRAMUSCULAR | Status: DC

## 2011-09-30 MED ORDER — DEXAMETHASONE SODIUM PHOSPHATE 10 MG/ML IJ SOLN: 10.0000 mg | Freq: Once | INTRAMUSCULAR | Status: DC

## 2011-09-30 NOTE — Pre-Procedure Instructions (Signed)
20 Curtis Riley  09/30/2011   Your procedure is scheduled on:  10/06/2011 Tuesday  Report to [facility] at 1019 AM.  Call this number if you have problems the morning of surgery: [phone]   Remember:   Do not eat food:After Midnight.  May have clear liquids: up to 4 Hours before arrival.DO NOT DRINK ANYTHING AFTER 0600 THE MORNING OF SURGERY.   Clear liquids include soda, tea, black coffee, apple or grape juice, broth.  Take these medicines the morning of surgery with A SIP OF WATER: ALBUTEROL INHALER AND BRING WITH YOU TO THE HOSPITAL.   CARVEDILOL.     Do not wear jewelry, make-up or nail polish.  Do not wear lotions, powders, or perfumes. You may wear deodorant.  Do not shave 48 hours prior to surgery.  Do not bring valuables to the hospital.  Contacts, dentures or bridgework may not be worn into surgery.  Leave suitcase in the car. After surgery it may be brought to your room.  For patients admitted to the hospital, checkout time is 11:00 AM the day of discharge.   Patients discharged the day of surgery will not be allowed to drive home.  Name and phone number of your driver: [phone]  Special Instructions: CHG Shower Use Special Wash: 1/2 bottle night before surgery and 1/2 bottle morning of surgery.   Please read over the following fact sheets that you were given: Pain Booklet, Coughing and Deep Breathing, MRSA Information and Surgical Site Infection Prevention

## 2011-09-30 NOTE — Progress Notes (Signed)
SPOKE WITH LYNN -MED REC AT DR LITTLES OFFICE... REQUESTED OV  EKG 09/29/11 AND STRESS TEST 2012 .

## 2011-10-01 NOTE — Progress Notes (Addendum)
Spoke with Wynona Canes in Medical Records at [facility] cardiology to rerequest most recent office not, EKG and any tests.   Notes received and placed on chart. Clearance note included.

## 2011-10-02 NOTE — Telephone Encounter (Signed)
sl

## 2011-10-05 ENCOUNTER — Encounter (HOSPITAL_COMMUNITY): Payer: Self-pay | Admitting: Pharmacy Technician

## 2011-10-06 ENCOUNTER — Encounter (HOSPITAL_COMMUNITY)
Admission: RE | Disposition: A | Discharge status: Home / Self Care | Payer: Self-pay | Source: Ambulatory Visit | Attending: Neurosurgery

## 2011-10-06 ENCOUNTER — Encounter (HOSPITAL_COMMUNITY): Payer: Self-pay | Admitting: Certified Registered"

## 2011-10-06 ENCOUNTER — Inpatient Hospital Stay (HOSPITAL_COMMUNITY): Payer: Medicare Other | Admitting: Certified Registered"

## 2011-10-06 ENCOUNTER — Encounter (HOSPITAL_COMMUNITY): Payer: Self-pay | Admitting: Surgery

## 2011-10-06 ENCOUNTER — Inpatient Hospital Stay (HOSPITAL_COMMUNITY): Payer: Medicare Other

## 2011-10-06 ENCOUNTER — Inpatient Hospital Stay (HOSPITAL_COMMUNITY)
Admission: RE | Admit: 2011-10-06 | Discharge: 2011-10-07 | DRG: 473 | Disposition: A | Discharge status: Home / Self Care | Payer: Medicare Other | Source: Ambulatory Visit | Attending: Neurosurgery | Admitting: Neurosurgery

## 2011-10-06 DIAGNOSIS — I10 Essential (primary) hypertension: Secondary | ICD-10-CM | POA: Diagnosis present

## 2011-10-06 DIAGNOSIS — M5020 Other cervical disc displacement, unspecified cervical region: Secondary | ICD-10-CM | POA: Diagnosis present

## 2011-10-06 DIAGNOSIS — Z87442 Personal history of urinary calculi: Secondary | ICD-10-CM | POA: Diagnosis not present

## 2011-10-06 DIAGNOSIS — Z8673 Personal history of transient ischemic attack (TIA), and cerebral infarction without residual deficits: Secondary | ICD-10-CM | POA: Diagnosis not present

## 2011-10-06 DIAGNOSIS — Z880 Allergy status to penicillin: Secondary | ICD-10-CM

## 2011-10-06 DIAGNOSIS — G4730 Sleep apnea, unspecified: Secondary | ICD-10-CM | POA: Diagnosis present

## 2011-10-06 DIAGNOSIS — Z87891 Personal history of nicotine dependence: Secondary | ICD-10-CM | POA: Diagnosis not present

## 2011-10-06 DIAGNOSIS — M129 Arthropathy, unspecified: Secondary | ICD-10-CM | POA: Diagnosis present

## 2011-10-06 DIAGNOSIS — J449 Chronic obstructive pulmonary disease, unspecified: Secondary | ICD-10-CM | POA: Diagnosis present

## 2011-10-06 DIAGNOSIS — Z01812 Encounter for preprocedural laboratory examination: Secondary | ICD-10-CM | POA: Diagnosis not present

## 2011-10-06 DIAGNOSIS — M542 Cervicalgia: Secondary | ICD-10-CM | POA: Diagnosis not present

## 2011-10-06 DIAGNOSIS — Z87892 Personal history of anaphylaxis: Secondary | ICD-10-CM

## 2011-10-06 DIAGNOSIS — M47812 Spondylosis without myelopathy or radiculopathy, cervical region: Principal | ICD-10-CM | POA: Diagnosis present

## 2011-10-06 DIAGNOSIS — J4489 Other specified chronic obstructive pulmonary disease: Secondary | ICD-10-CM | POA: Diagnosis present

## 2011-10-06 DIAGNOSIS — N400 Enlarged prostate without lower urinary tract symptoms: Secondary | ICD-10-CM | POA: Diagnosis present

## 2011-10-06 DIAGNOSIS — M4802 Spinal stenosis, cervical region: Secondary | ICD-10-CM

## 2011-10-06 HISTORY — PX: ANTERIOR CERVICAL DECOMP/DISCECTOMY FUSION: SHX1161

## 2011-10-06 SURGERY — ANTERIOR CERVICAL DECOMPRESSION/DISCECTOMY FUSION 3 LEVELS
Anesthesia: General | Site: Spine Cervical | Laterality: Bilateral | Wound class: Clean

## 2011-10-06 MED ORDER — LACTATED RINGERS IV SOLN
INTRAVENOUS | Status: DC | PRN
  Administered 2011-10-06 (×2): via INTRAVENOUS

## 2011-10-06 MED ORDER — PHENOL 1.4 % MT LIQD: 1.0000 | OROMUCOSAL | Status: DC | PRN

## 2011-10-06 MED ORDER — SODIUM CHLORIDE 0.9 % IV SOLN
INTRAVENOUS | Status: DC | PRN
  Administered 2011-10-06: 11:00:00 via INTRAVENOUS

## 2011-10-06 MED ORDER — HYDROCODONE-ACETAMINOPHEN 5-325 MG PO TABS
1.0000 | ORAL_TABLET | ORAL | Status: DC | PRN
  Administered 2011-10-06: 2 via ORAL
  Filled 2011-10-06: qty 2

## 2011-10-06 MED ORDER — SODIUM CHLORIDE 0.9 % IJ SOLN: 3.0000 mL | INTRAMUSCULAR | Status: DC | PRN

## 2011-10-06 MED ORDER — ACETAMINOPHEN 325 MG PO TABS: 650.0000 mg | ORAL_TABLET | ORAL | Status: DC | PRN

## 2011-10-06 MED ORDER — PROPOFOL 10 MG/ML IV EMUL
INTRAVENOUS | Status: DC | PRN
  Administered 2011-10-06: 150 mg via INTRAVENOUS

## 2011-10-06 MED ORDER — VANCOMYCIN HCL 1000 MG IV SOLR
1500.0000 mg | INTRAVENOUS | Status: AC
  Administered 2011-10-06: 1500 mg via INTRAVENOUS
  Filled 2011-10-06: qty 1500

## 2011-10-06 MED ORDER — SODIUM CHLORIDE 0.9 % IJ SOLN
3.0000 mL | Freq: Two times a day (BID) | INTRAMUSCULAR | Status: DC
  Administered 2011-10-06 – 2011-10-07 (×2): 3 mL via INTRAVENOUS

## 2011-10-06 MED ORDER — DEXAMETHASONE SODIUM PHOSPHATE 4 MG/ML IJ SOLN: 4.0000 mg | Freq: Four times a day (QID) | INTRAMUSCULAR | Status: AC

## 2011-10-06 MED ORDER — HYDROMORPHONE HCL PF 1 MG/ML IJ SOLN: 1.0000 mg | INTRAMUSCULAR | Status: DC | PRN

## 2011-10-06 MED ORDER — TOBRAMYCIN SULFATE 80 MG/2ML IJ SOLN
80.0000 mg | INTRAVENOUS | Status: AC
  Administered 2011-10-06: 80 mg via INTRAVENOUS
  Filled 2011-10-06: qty 2

## 2011-10-06 MED ORDER — DEXAMETHASONE 4 MG PO TABS
4.0000 mg | ORAL_TABLET | Freq: Four times a day (QID) | ORAL | Status: AC
  Administered 2011-10-06 (×2): 4 mg via ORAL
  Filled 2011-10-06 (×2): qty 1

## 2011-10-06 MED ORDER — DIPHENHYDRAMINE HCL 50 MG/ML IJ SOLN
INTRAMUSCULAR | Status: AC
  Administered 2011-10-06: 50 mg via INTRAVENOUS
  Filled 2011-10-06: qty 1

## 2011-10-06 MED ORDER — SODIUM CHLORIDE 0.9 % IV SOLN
INTRAVENOUS | Status: AC
  Filled 2011-10-06: qty 500

## 2011-10-06 MED ORDER — ONDANSETRON HCL 4 MG/2ML IJ SOLN: 4.0000 mg | INTRAMUSCULAR | Status: DC | PRN

## 2011-10-06 MED ORDER — GLYCOPYRROLATE 0.2 MG/ML IJ SOLN
INTRAMUSCULAR | Status: DC | PRN
  Administered 2011-10-06: .6 mg via INTRAVENOUS

## 2011-10-06 MED ORDER — BACITRACIN 50000 UNITS IM SOLR
INTRAMUSCULAR | Status: AC
  Filled 2011-10-06: qty 1

## 2011-10-06 MED ORDER — KCL IN DEXTROSE-NACL 20-5-0.45 MEQ/L-%-% IV SOLN
80.0000 mL/h | INTRAVENOUS | Status: DC
  Filled 2011-10-06 (×3): qty 1000

## 2011-10-06 MED ORDER — HYDROMORPHONE HCL PF 1 MG/ML IJ SOLN: 0.2500 mg | INTRAMUSCULAR | Status: DC | PRN

## 2011-10-06 MED ORDER — ACETAMINOPHEN 650 MG RE SUPP: 650.0000 mg | RECTAL | Status: DC | PRN

## 2011-10-06 MED ORDER — MIDAZOLAM HCL 5 MG/5ML IJ SOLN
INTRAMUSCULAR | Status: DC | PRN
  Administered 2011-10-06: 1 mg via INTRAVENOUS

## 2011-10-06 MED ORDER — CARVEDILOL 12.5 MG PO TABS
12.5000 mg | ORAL_TABLET | Freq: Two times a day (BID) | ORAL | Status: DC
  Administered 2011-10-06 – 2011-10-07 (×2): 12.5 mg via ORAL
  Filled 2011-10-06 (×4): qty 1

## 2011-10-06 MED ORDER — THROMBIN 20000 UNITS EX KIT
PACK | CUTANEOUS | Status: DC | PRN
  Administered 2011-10-06: 11:00:00 via TOPICAL

## 2011-10-06 MED ORDER — ONDANSETRON HCL 4 MG/2ML IJ SOLN
INTRAMUSCULAR | Status: DC | PRN
  Administered 2011-10-06: 4 mg via INTRAVENOUS

## 2011-10-06 MED ORDER — VANCOMYCIN HCL 500 MG IV SOLR: 500.0000 mg | Freq: Once | INTRAVENOUS | Status: DC

## 2011-10-06 MED ORDER — MENTHOL 3 MG MT LOZG: 1.0000 | LOZENGE | OROMUCOSAL | Status: DC | PRN

## 2011-10-06 MED ORDER — VANCOMYCIN HCL IN DEXTROSE 1-5 GM/200ML-% IV SOLN
1000.0000 mg | Freq: Once | INTRAVENOUS | Status: AC
  Administered 2011-10-06: 1000 mg via INTRAVENOUS
  Filled 2011-10-06: qty 200

## 2011-10-06 MED ORDER — EPHEDRINE SULFATE 50 MG/ML IJ SOLN
INTRAMUSCULAR | Status: DC | PRN
  Administered 2011-10-06 (×2): 5 mg via INTRAVENOUS

## 2011-10-06 MED ORDER — LIDOCAINE HCL 4 % MT SOLN
OROMUCOSAL | Status: DC | PRN
  Administered 2011-10-06: 4 mL via TOPICAL

## 2011-10-06 MED ORDER — 0.9 % SODIUM CHLORIDE (POUR BTL) OPTIME
TOPICAL | Status: DC | PRN
  Administered 2011-10-06: 1000 mL

## 2011-10-06 MED ORDER — ONDANSETRON HCL 4 MG/2ML IJ SOLN: 4.0000 mg | Freq: Once | INTRAMUSCULAR | Status: DC | PRN

## 2011-10-06 MED ORDER — DEXAMETHASONE SODIUM PHOSPHATE 10 MG/ML IJ SOLN
INTRAMUSCULAR | Status: AC
  Administered 2011-10-06: 10 mg via INTRAVENOUS
  Filled 2011-10-06: qty 1

## 2011-10-06 MED ORDER — FUROSEMIDE 40 MG PO TABS
40.0000 mg | ORAL_TABLET | Freq: Two times a day (BID) | ORAL | Status: DC
  Filled 2011-10-06 (×3): qty 1

## 2011-10-06 MED ORDER — LISINOPRIL 40 MG PO TABS
40.0000 mg | ORAL_TABLET | Freq: Every day | ORAL | Status: DC
  Administered 2011-10-06 – 2011-10-07 (×2): 40 mg via ORAL
  Filled 2011-10-06 (×2): qty 1

## 2011-10-06 MED ORDER — IPRATROPIUM-ALBUTEROL 18-103 MCG/ACT IN AERO
2.0000 | INHALATION_SPRAY | Freq: Four times a day (QID) | RESPIRATORY_TRACT | Status: DC | PRN
  Filled 2011-10-06: qty 14.7

## 2011-10-06 MED ORDER — NEOSTIGMINE METHYLSULFATE 1 MG/ML IJ SOLN
INTRAMUSCULAR | Status: DC | PRN
  Administered 2011-10-06: 4 mg via INTRAVENOUS

## 2011-10-06 MED ORDER — SODIUM CHLORIDE 0.9 % IR SOLN
Status: DC | PRN
  Administered 2011-10-06: 11:00:00

## 2011-10-06 MED ORDER — FENTANYL CITRATE 0.05 MG/ML IJ SOLN
INTRAMUSCULAR | Status: DC | PRN
  Administered 2011-10-06: 50 ug via INTRAVENOUS
  Administered 2011-10-06: 150 ug via INTRAVENOUS
  Administered 2011-10-06 (×4): 50 ug via INTRAVENOUS

## 2011-10-06 MED ORDER — THROMBIN 20000 UNITS EX KIT: PACK | CUTANEOUS | Status: DC | PRN

## 2011-10-06 MED ORDER — ROCURONIUM BROMIDE 100 MG/10ML IV SOLN
INTRAVENOUS | Status: DC | PRN
  Administered 2011-10-06: 50 mg via INTRAVENOUS
  Administered 2011-10-06: 20 mg via INTRAVENOUS

## 2011-10-06 SURGICAL SUPPLY — 65 items
APL SKNCLS STERI-STRIP NONHPOA (GAUZE/BANDAGES/DRESSINGS) ×3
BAG DECANTER FOR FLEXI CONT (MISCELLANEOUS) ×2 IMPLANT
BENZOIN TINCTURE PRP APPL 2/3 (GAUZE/BANDAGES/DRESSINGS) ×6 IMPLANT
BIT DRILL TRINICA 2.3MM (BIT) IMPLANT
BRUSH SCRUB EZ PLAIN DRY (MISCELLANEOUS) ×2 IMPLANT
CANISTER SUCTION 2500CC (MISCELLANEOUS) ×2 IMPLANT
CLOTH BEACON ORANGE TIMEOUT ST (SAFETY) ×2 IMPLANT
CONT SPEC 4OZ CLIKSEAL STRL BL (MISCELLANEOUS) ×2 IMPLANT
DRAIN SNY WOU 7FLT (WOUND CARE) ×1 IMPLANT
DRAPE C-ARM 42X72 X-RAY (DRAPES) ×4 IMPLANT
DRAPE LAPAROTOMY 100X72 PEDS (DRAPES) ×2 IMPLANT
DRAPE MICROSCOPE LEICA (MISCELLANEOUS) ×1 IMPLANT
DRAPE MICROSCOPE ZEISS OPMI (DRAPES) ×1 IMPLANT
DRAPE POUCH INSTRU U-SHP 10X18 (DRAPES) ×2 IMPLANT
DRAPE SURG 17X23 STRL (DRAPES) ×4 IMPLANT
DRESSING TELFA 8X3 (GAUZE/BANDAGES/DRESSINGS) ×2 IMPLANT
DRILL BIT TRINICA 2.3MM (BIT)
ELECT COATED BLADE 2.86 ST (ELECTRODE) ×2 IMPLANT
ELECT REM PT RETURN 9FT ADLT (ELECTROSURGICAL) ×2
ELECTRODE REM PT RTRN 9FT ADLT (ELECTROSURGICAL) ×1 IMPLANT
EVACUATOR SILICONE 100CC (DRAIN) ×1 IMPLANT
GAUZE SPONGE 4X4 16PLY XRAY LF (GAUZE/BANDAGES/DRESSINGS) IMPLANT
GLOVE BIOGEL PI IND STRL 7.5 (GLOVE) IMPLANT
GLOVE BIOGEL PI IND STRL 8 (GLOVE) IMPLANT
GLOVE BIOGEL PI IND STRL 8.5 (GLOVE) IMPLANT
GLOVE BIOGEL PI INDICATOR 7.5 (GLOVE) ×1
GLOVE BIOGEL PI INDICATOR 8 (GLOVE) ×2
GLOVE BIOGEL PI INDICATOR 8.5 (GLOVE) ×2
GLOVE ECLIPSE 7.5 STRL STRAW (GLOVE) ×5 IMPLANT
GLOVE EXAM NITRILE LRG STRL (GLOVE) IMPLANT
GLOVE EXAM NITRILE MD LF STRL (GLOVE) ×2 IMPLANT
GLOVE EXAM NITRILE XL STR (GLOVE) IMPLANT
GLOVE EXAM NITRILE XS STR PU (GLOVE) IMPLANT
GLOVE SURG SS PI 8.0 STRL IVOR (GLOVE) ×4 IMPLANT
GOWN BRE IMP SLV AUR LG STRL (GOWN DISPOSABLE) IMPLANT
GOWN BRE IMP SLV AUR XL STRL (GOWN DISPOSABLE) ×5 IMPLANT
GOWN STRL REIN 2XL LVL4 (GOWN DISPOSABLE) ×2 IMPLANT
HEAD HALTER (SOFTGOODS) ×2 IMPLANT
INTERBODY TM 11X14X6-7DEG ANG (Metal Cage) ×1 IMPLANT
KIT BASIN OR (CUSTOM PROCEDURE TRAY) ×2 IMPLANT
KIT ROOM TURNOVER OR (KITS) ×2 IMPLANT
NDL SPNL 20GX3.5 QUINCKE YW (NEEDLE) ×1 IMPLANT
NEEDLE SPNL 20GX3.5 QUINCKE YW (NEEDLE) ×2 IMPLANT
NS IRRIG 1000ML POUR BTL (IV SOLUTION) ×2 IMPLANT
PACK LAMINECTOMY NEURO (CUSTOM PROCEDURE TRAY) ×2 IMPLANT
PAD ARMBOARD 7.5X6 YLW CONV (MISCELLANEOUS) ×2 IMPLANT
PATTIES SURGICAL .25X.25 (GAUZE/BANDAGES/DRESSINGS) IMPLANT
PATTIES SURGICAL .75X.75 (GAUZE/BANDAGES/DRESSINGS) ×1 IMPLANT
PUTTY BONE GRAFT KIT 2.5ML (Bone Implant) ×1 IMPLANT
RUBBERBAND STERILE (MISCELLANEOUS) ×4 IMPLANT
SPACER TMS PARAL FUS 11X14 7M (Spacer) ×2 IMPLANT
SPONGE GAUZE 4X4 12PLY (GAUZE/BANDAGES/DRESSINGS) ×2 IMPLANT
SPONGE INTESTINAL PEANUT (DISPOSABLE) ×2 IMPLANT
SPONGE SURGIFOAM ABS GEL 100 (HEMOSTASIS) ×2 IMPLANT
STRIP CLOSURE SKIN 1/2X4 (GAUZE/BANDAGES/DRESSINGS) ×2 IMPLANT
SUT PDS AB 5-0 P3 18 (SUTURE) ×3 IMPLANT
SUT VIC AB 3-0 CP2 18 (SUTURE) ×2 IMPLANT
SYR 20ML ECCENTRIC (SYRINGE) ×1 IMPLANT
TAPE CLOTH SURG 4X10 WHT LF (GAUZE/BANDAGES/DRESSINGS) ×1 IMPLANT
TOOL MATCHSTK 3MM (MISCELLANEOUS) ×2 IMPLANT
TOWEL OR 17X24 6PK STRL BLUE (TOWEL DISPOSABLE) ×2 IMPLANT
TOWEL OR 17X26 10 PK STRL BLUE (TOWEL DISPOSABLE) ×2 IMPLANT
TRAP SPECIMEN MUCOUS 40CC (MISCELLANEOUS) ×1 IMPLANT
TRAY FOLEY CATH 14FRSI W/METER (CATHETERS) ×1 IMPLANT
WATER STERILE IRR 1000ML POUR (IV SOLUTION) ×2 IMPLANT

## 2011-10-06 NOTE — Brief Op Note (Signed)
10/06/2011  1:22 PM  PATIENT:  Curtis Riley  77 y.o. male  PRE-OPERATIVE DIAGNOSIS:  Herniated Nucleus Pulposus  POST-OPERATIVE DIAGNOSIS:  Herniated Nucleus Pulposus  PROCEDURE:  Procedure(s) (LRB): ANTERIOR CERVICAL DECOMPRESSION/DISCECTOMY FUSION 3 LEVELS (Bilateral)  SURGEON:  Surgeon(s) and Role:    * Reinaldo Meeker, MD - Primary    * Temple Pacini, MD - Assisting  PHYSICIAN ASSISTANT:   ASSISTANTS: Pool   ANESTHESIA:   general  EBL:  Total I/O In: 1500 [I.V.:1500] Out: 610 [Urine:410; Blood:200]  BLOOD ADMINISTERED:none  DRAINS: (7mm) Curtis Riley drain(s) with closed bulb suction in the prevertebral   LOCAL MEDICATIONS USED:  NONE  SPECIMEN:  No Specimen  DISPOSITION OF SPECIMEN:  N/A  COUNTS:  YES  TOURNIQUET:  * No tourniquets in log *  DICTATION: .Dragon Dictation  PLAN OF CARE: Admit to inpatient   PATIENT DISPOSITION:  PACU - hemodynamically stable.   Delay start of Pharmacological VTE agent (>24hrs) due to surgical blood loss or risk of bleeding: yes

## 2011-10-06 NOTE — Progress Notes (Signed)
Jp drain out on arrival to unit, also pt. Now complaining of new numbness and tingling in his left hand

## 2011-10-06 NOTE — Transfer of Care (Signed)
Immediate Anesthesia Transfer of Care Note  Patient: Curtis Riley  Procedure(s) Performed: Procedure(s) (LRB): ANTERIOR CERVICAL DECOMPRESSION/DISCECTOMY FUSION 3 LEVELS (Bilateral)  Patient Location: PACU  Anesthesia Type: General  Level of Consciousness: awake, oriented and sedated  Airway & Oxygen Therapy: Patient Spontanous Breathing and Patient connected to nasal cannula oxygen  Post-op Assessment: Report given to PACU RN, Post -op Vital signs reviewed and stable, Patient moving all extremities X 4 and Patient able to stick tongue midline  Post vital signs: Reviewed and stable  Complications: No apparent anesthesia complications

## 2011-10-06 NOTE — H&P (Signed)
Curtis Riley is an 77 y.o. male.   Chief Complaint: Left upper extremity pain with weakness HPI: The patient is a 77 year old gentleman who presented with severe left upper extremity weakness and proximal pain. He been tried on conservative therapy without improvement and then had an MRI scan. When seen in the office the patient had complete absence of his left deltoid strength. His MRI scan was reviewed and showed a central disc herniation with spinal stenosis at C3-4 and marked foraminal encroachment on the left, symptomatic side at C4-5 and C5-6. After discussing the options the patient is now brought for surgery. I had a long discussion with him regarding the risks and benefits of surgical intervention. The risks discussed include but are not limited to bleeding infection weakness numbness paralysis spinal fluid leak coma quadriplegia hoarseness and death. We have discussed alternative methods of therapy along with the risks and benefits of nonintervention. He has had the opportunity to rest numerous questions and appears to understand. With this information in hand he has requested that we proceed with surgery.  Past Medical History  Diagnosis Date  . BPH (benign prostatic hyperplasia)   . Smoker   . CVA (cerebral infarction)   . Hypertension   . COPD (chronic obstructive pulmonary disease)   . Diverticulosis   . Angina     occasional chest pain  never been treated for this ...   . Shortness of breath     with exertion   . Sleep apnea     uses cpap  . Kidney stones     10-15 yrs ago  . Neuromuscular disorder   . Arthritis     Past Surgical History  Procedure Date  . Transurethral resection of prostate 08/1996  . Transurethral resection of prostate     long time ago per pt.  10-15 yrs  . Cardiac catheterization     10/2010  . Tonsillectomy     Family History  Problem Relation Age of Onset  . Arthritis Mother   . Hypertension Mother   . Arthritis Father   .  Hypertension Father   . Arthritis Sister   . Diabetes Sister   . Hypertension Sister   . Arthritis Brother   . Hypertension Brother    Social History:  reports that he quit smoking about 13 years ago. He has never used smokeless tobacco. He reports that he does not drink alcohol or use illicit drugs.  Allergies:  Allergies  Allergen Reactions  . Penicillins Anaphylaxis    Medications Prior to Admission  Medication Dose Route Frequency Provider Last Rate Last Dose  . bacitracin 40981 UNITS injection           . dexamethasone (DECADRON) 10 MG/ML injection           . diphenhydrAMINE (BENADRYL) 50 MG/ML injection           . sodium chloride 0.9 % infusion           . tobramycin (NEBCIN) 80 mg in dextrose 5 % 50 mL IVPB  80 mg Intravenous To OR Reinaldo Meeker, MD      . vancomycin (VANCOCIN) 1,500 mg in sodium chloride 0.9 % 500 mL IVPB  1,500 mg Intravenous To OR Reinaldo Meeker, MD      . DISCONTD: vancomycin (VANCOCIN) 500 mg in sodium chloride 0.9 % 100 mL IVPB  500 mg Intravenous Once Reinaldo Meeker, MD       Medications Prior to Admission  Medication Sig Dispense Refill  . albuterol-ipratropium (COMBIVENT) 18-103 MCG/ACT inhaler Inhale 2 puffs into the lungs every 6 (six) hours as needed. For shortness of breath      . aspirin 81 MG tablet Take 81 mg by mouth daily.        . carvedilol (COREG) 12.5 MG tablet Take 12.5 mg by mouth 2 (two) times daily with a meal.        . furosemide (LASIX) 40 MG tablet Take 40 mg by mouth 2 (two) times daily.        Marland Kitchen lisinopril (PRINIVIL,ZESTRIL) 40 MG tablet Take 40 mg by mouth daily.        . Multiple Vitamins-Minerals (MULTIVITAMIN WITH MINERALS) tablet Take 1 tablet by mouth daily.          No results found for this or any previous visit (from the past 48 hour(s)). No results found.  A comprehensive review of systems was negative.  Blood pressure 156/92, pulse 81, temperature 97.9 F (36.6 C), temperature source Oral, resp. rate 20,  SpO2 98.00%.  The patient is awake alert and oriented. He has no facial asymmetry. His gait is nonantalgic. His reflexes are decreased but equal. He is normal strength except for absence of the left deltoid strength and some brachial radialis weakness on the left side. Assessment/Plan Impression is that of left upper extremity weakness with spondylosis and herniated disc at C3-4 C4-5 and C5-6. The plan is for a three-level anterior cervical discectomy with fusion and plating.  Reinaldo Meeker, MD 10/06/2011, 10:25 AM

## 2011-10-06 NOTE — Progress Notes (Signed)
ANTIBIOTIC CONSULT NOTE - INITIAL  Pharmacy Consult for Vancomycin Indication: surgical prophylaxis  Allergies  Allergen Reactions  . Penicillins Anaphylaxis    Patient Measurements:   Weight 116.3 kg  Vital Signs: Temp: 98.2 F (36.8 C) (03/19 1600) Temp src: Oral (03/19 1600) BP: 157/90 mmHg (03/19 1600) Pulse Rate: 82  (03/19 1600) Intake/Output from previous day:   Intake/Output from this shift: Total I/O In: 2775 [I.V.:2200; Other:575] Out: 610 [Urine:410; Blood:200]  Labs: Scr 0.99 No results found for this basename: WBC:3,HGB:3,PLT:3,LABCREA:3,CREATININE:3 in the last 72 hours The CrCl is unknown because both a height and weight (above a minimum accepted value) are required for this calculation. No results found for this basename: VANCOTROUGH:2,VANCOPEAK:2,VANCORANDOM:2,GENTTROUGH:2,GENTPEAK:2,GENTRANDOM:2,TOBRATROUGH:2,TOBRAPEAK:2,TOBRARND:2,AMIKACINPEAK:2,AMIKACINTROU:2,AMIKACIN:2, in the last 72 hours   Microbiology: Recent Results (from the past 720 hour(s))  SURGICAL PCR SCREEN     Status: Normal   Collection Time   09/30/11  3:06 PM      Component Value Range Status Comment   MRSA, PCR NEGATIVE  NEGATIVE  Final    Staphylococcus aureus NEGATIVE  NEGATIVE  Final     Medical History: Past Medical History  Diagnosis Date  . BPH (benign prostatic hyperplasia)   . Smoker   . CVA (cerebral infarction)   . Hypertension   . COPD (chronic obstructive pulmonary disease)   . Diverticulosis   . Angina     occasional chest pain  never been treated for this ...   . Shortness of breath     with exertion   . Sleep apnea     uses cpap  . Kidney stones     10-15 yrs ago  . Neuromuscular disorder   . Arthritis     Medications:  Anti-infectives     Start     Dose/Rate Route Frequency Ordered Stop   10/06/11 1120   bacitracin 50,000 Units in sodium chloride irrigation 0.9 % 500 mL irrigation  Status:  Discontinued          As needed 10/06/11 1246 10/06/11  1332   10/06/11 1030   bacitracin 16109 UNITS injection  Status:  Discontinued     Comments: FREEZE, PATRICIA: cabinet override         10/06/11 1030 10/06/11 1031   10/06/11 1015   tobramycin (NEBCIN) 80 mg in dextrose 5 % 50 mL IVPB        80 mg 104 mL/hr over 30 Minutes Intravenous To Surgery 10/06/11 1004 10/06/11 1055   10/06/11 1015   vancomycin (VANCOCIN) 500 mg in sodium chloride 0.9 % 100 mL IVPB  Status:  Discontinued        500 mg 100 mL/hr over 60 Minutes Intravenous  Once 10/06/11 1004 10/06/11 1007   10/06/11 1015   vancomycin (VANCOCIN) 1,500 mg in sodium chloride 0.9 % 500 mL IVPB        1,500 mg 250 mL/hr over 120 Minutes Intravenous To Surgery 10/06/11 1008 10/06/11 1055   10/06/11 0944   bacitracin 60454 UNITS injection     Comments: Alona Bene: cabinet override         10/06/11 0944 10/06/11 2144         Assessment: Surgical prophylaxis s/p spinal surgery:  To receive Vancomycin x 1 dose post-operatively.  Goal of Therapy:  Vancomycin trough level 10-15 mcg/ml  Plan:  Vancomycin 1gm IV x 1   Estella Husk, Pharm.D., BCPS Clinical Pharmacist  Pager [phone] 10/06/2011, 4:24 PM

## 2011-10-06 NOTE — Anesthesia Preprocedure Evaluation (Signed)
Anesthesia Evaluation  Patient identified by MRN, date of birth, ID band Patient awake    Reviewed: Allergy & Precautions, H&P , NPO status , Patient's Chart, lab work & pertinent test results, reviewed documented beta blocker date and time   Airway Mallampati: II TM Distance: >3 FB Neck ROM: Full    Dental   Pulmonary  breath sounds clear to auscultation        Cardiovascular Rhythm:Regular Rate:Normal     Neuro/Psych    GI/Hepatic   Endo/Other    Renal/GU      Musculoskeletal   Abdominal   Peds  Hematology   Anesthesia Other Findings   Reproductive/Obstetrics                           Anesthesia Physical Anesthesia Plan  ASA: III  Anesthesia Plan: General   Post-op Pain Management:    Induction: Intravenous  Airway Management Planned: Oral ETT  Additional Equipment:   Intra-op Plan:   Post-operative Plan: Extubation in OR  Informed Consent: I have reviewed the patients History and Physical, chart, labs and discussed the procedure including the risks, benefits and alternatives for the proposed anesthesia with the patient or authorized representative who has indicated his/her understanding and acceptance.   Dental advisory given  Plan Discussed with:   Anesthesia Plan Comments:         Anesthesia Quick Evaluation

## 2011-10-06 NOTE — Op Note (Signed)
Preop diagnosis: Herniated disc and spondylosis C3-4 C4-5 C5-6 Postop diagnosis: Same Procedure: C3-4 C4-5 C5-6 decompressive anterior cervical discectomy with trabecular metal interbody fusion and Trinica anterior cervical plating Surgeon: Zo Loudon Assistant: Pool  After and placed in supine position and 10 pounds halter traction the patient's neck was prepped and draped in the usual sterile fashion. Localizing fluoroscopy was used prior to incision to identify the appropriate level. Linear incision was made along the anterior aspect of the sternocleidomastoid muscle and carried down through the subcutaneous tissues. The platysma muscle was incised along the line of the skin incision and the natural fascial plane between the strap muscles medially and the sternocleidomastoid laterally was identified and followed down to the anterior aspect the cervical spine. Longus cole muscles were identified split in the midline and stripped away with the Kitner dissector and unipolar coagulation. Self-retaining retractor was placed for exposure and x-ray showed approach the appropriate levels. Using a 15 blade the annulus of the disc at C3-4 C4-5 and C5-6 was incised. Using pituitary rongeurs and curettes approximately 90% of the disc material was removed at each level. High-speed drill was used to widen the interspace as and bony shavings were saved for use later in the case. Microscope was draped brought into the field and used for the remainder of the case. Starting at C5-6 the remainder of the disc material down the posterior longitudinal ligament was removed. Ligament was incised transversely and the cut edges removed a Kerrison punch. Thorough decompression of the spinal dura and foramen bilaterally was carried out particular towards the left, symptomatic side. C6 nerve root was well visualized well decompressed. Attention was then turned C4-5 were similar procedure was carried out. Once again residual bone and  posterior longitudinal ligament were removed to decompress the underlying spinal dura. Once again generous foraminal decompression was carried out bilaterally particular towards the left, symptomatic side. The C5 nerve root was well visualized well decompressed at this time. We then turned our attention to C3-4 were once again removed the remainder of the disc material down the posterior longitudinal ligament. At this level there was mostly central stenosis and a herniated disc material pushed on the spinal dura was removed in a piecemeal fashion. Thorough decompression was carried out. At this time inspection was carried out at all 3 levels for any evidence of residual compression and none could be identified. Large amounts of irrigation were carried out and any bleeding controlled with upper coagulation and Gelfoam. Measurements were taken and to 7 mm Were chosen and one 6 mm angled graft was chosen. The graft were filled with a mixture of autologous bone and morselized allograft. The 2 parallel graft were then impacted at C4-5 and C5-6 difficulty and angled graft impacted at C3-4. Fluoroscopy showed them to be in excellent position. An appropriately length Trinica anterior cervical plate was then chosen. Under fluoroscopic guidance drill holes were placed followed by placing of 14 mm screws x8. The mechanism was rotated into the locked position and final fluoroscopy showed good position of the plates screws and plugs. Irrigation was carried out any bleeding control proper coagulation. A 7 mm flat [place]-Pratt drain was brought in through a separate stab incision and left in the prevertebral space. The wounds and closed with inverted Vicryl on the platysma muscle inverted 5-0 PDS in the subcuticular layer and Steri-Strips on the skin. A sterile dressing a soft collar applied and the patient was extubated and taken to recovery in stable condition.

## 2011-10-06 NOTE — Anesthesia Postprocedure Evaluation (Signed)
  Anesthesia Post-op Note  Patient: Curtis Riley  Procedure(s) Performed: Procedure(s) (LRB): ANTERIOR CERVICAL DECOMPRESSION/DISCECTOMY FUSION 3 LEVELS (Bilateral)  Patient Location: PACU  Anesthesia Type: General  Level of Consciousness: awake, alert  and oriented  Airway and Oxygen Therapy: Patient Spontanous Breathing and Patient connected to nasal cannula oxygen  Post-op Pain: none  Post-op Assessment: Post-op Vital signs reviewed and Patient's Cardiovascular Status Stable  Post-op Vital Signs: stable  Complications: No apparent anesthesia complications

## 2011-10-07 MED ORDER — HYDROCODONE-ACETAMINOPHEN 5-325 MG PO TABS: 1.0000 | ORAL_TABLET | ORAL | Status: AC | PRN

## 2011-10-07 MED ORDER — METHYLPREDNISOLONE 4 MG PO KIT: PACK | ORAL | Status: AC

## 2011-10-07 NOTE — Progress Notes (Signed)
UR COMPLETED  

## 2011-10-07 NOTE — Discharge Summary (Signed)
  Surgery one day with multiple level acdf. Did well. Only post op issue was some increased brachio radialis weakness on the left. Otherwise neuro stable. Put omn steroid pak at d/c for this. Wound fine. Ambulated well with no lower extremity issues. Condition improved vs pre op with decompression of spinal cord.Specific instructions given.

## 2011-10-08 ENCOUNTER — Encounter (HOSPITAL_COMMUNITY): Payer: Self-pay | Admitting: Neurosurgery

## 2011-10-21 DIAGNOSIS — M4712 Other spondylosis with myelopathy, cervical region: Secondary | ICD-10-CM | POA: Diagnosis not present

## 2011-10-26 DIAGNOSIS — M4712 Other spondylosis with myelopathy, cervical region: Secondary | ICD-10-CM | POA: Diagnosis not present

## 2011-10-29 DIAGNOSIS — M4712 Other spondylosis with myelopathy, cervical region: Secondary | ICD-10-CM | POA: Diagnosis not present

## 2011-11-03 DIAGNOSIS — M4712 Other spondylosis with myelopathy, cervical region: Secondary | ICD-10-CM | POA: Diagnosis not present

## 2011-11-06 DIAGNOSIS — M4712 Other spondylosis with myelopathy, cervical region: Secondary | ICD-10-CM | POA: Diagnosis not present

## 2011-11-10 DIAGNOSIS — M4712 Other spondylosis with myelopathy, cervical region: Secondary | ICD-10-CM | POA: Diagnosis not present

## 2011-11-13 DIAGNOSIS — M4712 Other spondylosis with myelopathy, cervical region: Secondary | ICD-10-CM | POA: Diagnosis not present

## 2011-11-16 ENCOUNTER — Encounter: Payer: Self-pay | Admitting: Family Medicine

## 2011-11-16 ENCOUNTER — Ambulatory Visit (INDEPENDENT_AMBULATORY_CARE_PROVIDER_SITE_OTHER): Payer: Medicare Other | Admitting: Family Medicine

## 2011-11-16 VITALS — BP 126/78 | HR 65 | Wt 236.0 lb

## 2011-11-16 DIAGNOSIS — K602 Anal fissure, unspecified: Secondary | ICD-10-CM | POA: Diagnosis not present

## 2011-11-16 NOTE — Patient Instructions (Signed)
Drink plenty of fluids, get bulk in your diet. Metamucil as one way to do it. Exercise also will help.

## 2011-11-16 NOTE — Progress Notes (Signed)
  Subjective:    Patient ID: Curtis Riley, male    DOB: 1934/10/09, 77 y.o.   MRN: [id number]  HPI He recently had surgery and has had difficulty with constipation as well as occasional bright red rectal bleeding with this. He is also noted slight burning sensation with the rectal bleeding. He has a previous history of difficulty with constipation. He has had a recent colonoscopy.  Review of Systems     Objective:   Physical Exam Exam of the anus does show a rectal fissure present at 12:00. No other lesions are noted.       Assessment & Plan:   1. Rectal fissure    I explained to him how this can occur. Encouraged him to get plenty of fluids, bulk in his diet, exercise. He will call if further difficulties.

## 2011-11-17 DIAGNOSIS — M4712 Other spondylosis with myelopathy, cervical region: Secondary | ICD-10-CM | POA: Diagnosis not present

## 2011-11-19 DIAGNOSIS — M4712 Other spondylosis with myelopathy, cervical region: Secondary | ICD-10-CM | POA: Diagnosis not present

## 2012-01-13 DIAGNOSIS — M4712 Other spondylosis with myelopathy, cervical region: Secondary | ICD-10-CM | POA: Diagnosis not present

## 2012-03-30 DIAGNOSIS — M4712 Other spondylosis with myelopathy, cervical region: Secondary | ICD-10-CM | POA: Diagnosis not present

## 2012-04-06 DIAGNOSIS — E782 Mixed hyperlipidemia: Secondary | ICD-10-CM | POA: Diagnosis not present

## 2012-04-06 DIAGNOSIS — I10 Essential (primary) hypertension: Secondary | ICD-10-CM | POA: Diagnosis not present

## 2012-04-06 DIAGNOSIS — J449 Chronic obstructive pulmonary disease, unspecified: Secondary | ICD-10-CM | POA: Diagnosis not present

## 2012-04-18 ENCOUNTER — Telehealth: Payer: Self-pay | Admitting: Internal Medicine

## 2012-04-18 MED ORDER — IPRATROPIUM-ALBUTEROL 18-103 MCG/ACT IN AERO: 2.0000 | INHALATION_SPRAY | Freq: Four times a day (QID) | RESPIRATORY_TRACT | Status: DC | PRN

## 2012-04-18 NOTE — Telephone Encounter (Signed)
Combivent was renewed

## 2012-05-05 ENCOUNTER — Telehealth: Payer: Self-pay | Admitting: Family Medicine

## 2012-05-09 ENCOUNTER — Ambulatory Visit (INDEPENDENT_AMBULATORY_CARE_PROVIDER_SITE_OTHER): Payer: Medicare Other | Admitting: Family Medicine

## 2012-05-09 ENCOUNTER — Encounter: Payer: Self-pay | Admitting: Family Medicine

## 2012-05-09 VITALS — BP 128/88 | HR 67 | Wt 249.0 lb

## 2012-05-09 DIAGNOSIS — J449 Chronic obstructive pulmonary disease, unspecified: Secondary | ICD-10-CM

## 2012-05-09 NOTE — Progress Notes (Signed)
  Subjective:    Patient ID: Curtis Riley, male    DOB: [birth year], 77 y.o.   MRN: [id number]  HPI He is here for consultation. He does have underlying COPD and has done quite nicely on Combivent. In the past he had been on Advair as well as Spiriva which either didn't work or he had unacceptable side effects. He would like to continue on Combivent but his pharmaceutical company apparently will not be carrying this.   Review of Systems     Objective:   Physical Exam Alert and in no distress otherwise not examined       Assessment & Plan:   1. COPD (chronic obstructive pulmonary disease)    We discussed options. [facility] and will also check with his insurance coverage and see what else and that same class can be covered.

## 2012-05-11 DIAGNOSIS — M4712 Other spondylosis with myelopathy, cervical region: Secondary | ICD-10-CM | POA: Diagnosis not present

## 2012-05-16 ENCOUNTER — Telehealth: Payer: Self-pay | Admitting: Family Medicine

## 2012-05-16 ENCOUNTER — Other Ambulatory Visit: Payer: Self-pay | Admitting: Family Medicine

## 2012-05-16 DIAGNOSIS — M25519 Pain in unspecified shoulder: Secondary | ICD-10-CM | POA: Diagnosis not present

## 2012-05-16 MED ORDER — IPRATROPIUM-ALBUTEROL 18-103 MCG/ACT IN AERO: 2.0000 | INHALATION_SPRAY | Freq: Four times a day (QID) | RESPIRATORY_TRACT | Status: DC | PRN

## 2012-05-16 NOTE — Telephone Encounter (Signed)
This is okay but warn the patient

## 2012-05-16 NOTE — Telephone Encounter (Signed)
Patient wants refill on combivent sent to [facility],  He wants to try that for 6 months and then he may pursue mail order again

## 2012-05-17 ENCOUNTER — Other Ambulatory Visit: Payer: Self-pay

## 2012-05-17 MED ORDER — IPRATROPIUM-ALBUTEROL 20-100 MCG/ACT IN AERS: 1.0000 | INHALATION_SPRAY | Freq: Four times a day (QID) | RESPIRATORY_TRACT | Status: DC

## 2012-05-17 NOTE — Telephone Encounter (Signed)
CALLED PT LEFT MESSAGE FOR PT THAT CHANGE OF MED BECAUSE OF DISCONTINUED COMBIVENT

## 2012-05-17 NOTE — Telephone Encounter (Signed)
CHANGED COMBIVENT TO COMBIVENT RESPIMAT PER JCL

## 2012-06-02 ENCOUNTER — Telehealth: Payer: Self-pay | Admitting: Family Medicine

## 2012-06-02 DIAGNOSIS — M5412 Radiculopathy, cervical region: Secondary | ICD-10-CM | POA: Diagnosis not present

## 2012-06-02 NOTE — Telephone Encounter (Signed)
Patient stopped by the office. Combivent no longer carried by his mail order company. He wants a comparable Rx that he can get filled at his mail order pharmacy  Spoke to Dr. Susann Givens and he states to have patient check and see what comparable Rx that is covered. Patient will check and let us know

## 2012-06-06 ENCOUNTER — Telehealth: Payer: Self-pay | Admitting: Family Medicine

## 2012-06-06 MED ORDER — IPRATROPIUM-ALBUTEROL 20-100 MCG/ACT IN AERS: 1.0000 | INHALATION_SPRAY | Freq: Four times a day (QID) | RESPIRATORY_TRACT | Status: DC

## 2012-06-06 NOTE — Telephone Encounter (Signed)
Fix this

## 2012-06-06 NOTE — Telephone Encounter (Signed)
Sent to pharmacy combivent respimate

## 2012-06-06 NOTE — Telephone Encounter (Signed)
Mail order pharmacy [facility] is out of pts regular combivent. Pt called and was informed that the phamacy does have combivent respimat. Pt needs an rx for that sent in.

## 2012-07-25 NOTE — Telephone Encounter (Signed)
x

## 2012-08-29 ENCOUNTER — Telehealth: Payer: Self-pay | Admitting: Internal Medicine

## 2012-08-29 MED ORDER — POTASSIUM CHLORIDE ER 10 MEQ PO TBCR: 10.0000 meq | EXTENDED_RELEASE_TABLET | ORAL | Status: DC

## 2012-08-29 NOTE — Telephone Encounter (Signed)
Klor-Con renewed

## 2012-10-07 DIAGNOSIS — J449 Chronic obstructive pulmonary disease, unspecified: Secondary | ICD-10-CM | POA: Diagnosis not present

## 2012-10-07 DIAGNOSIS — I10 Essential (primary) hypertension: Secondary | ICD-10-CM | POA: Diagnosis not present

## 2012-10-07 DIAGNOSIS — E782 Mixed hyperlipidemia: Secondary | ICD-10-CM | POA: Diagnosis not present

## 2013-01-26 ENCOUNTER — Other Ambulatory Visit: Payer: Self-pay | Admitting: Family Medicine

## 2013-02-07 DIAGNOSIS — R198 Other specified symptoms and signs involving the digestive system and abdomen: Secondary | ICD-10-CM | POA: Diagnosis not present

## 2013-02-07 DIAGNOSIS — K5901 Slow transit constipation: Secondary | ICD-10-CM | POA: Diagnosis not present

## 2013-02-10 ENCOUNTER — Telehealth: Payer: Self-pay | Admitting: Internal Medicine

## 2013-02-10 NOTE — Telephone Encounter (Signed)
Pt states he needs a new prescription sent to [facility] for his CPAP supplies. Telephone number for [facility] is [phone].

## 2013-02-13 NOTE — Telephone Encounter (Signed)
Message forwarded to Dr. Pierre Bali, CMA.  Chart# [id number] on Dr. Landry Dyke cart.

## 2013-02-15 NOTE — Telephone Encounter (Signed)
Needs RX to [facility] ? Was this done. Victorino Dike from [facility]  was there yesterday

## 2013-02-17 NOTE — Telephone Encounter (Signed)
Spoke with [facility]. They requested copy of sleep study and FTF office note along with order to be able to process supply request. Requested information faxed to them.

## 2013-02-23 ENCOUNTER — Telehealth: Payer: Self-pay | Admitting: *Deleted

## 2013-02-23 NOTE — Telephone Encounter (Signed)
Faxed CPAP order back to [facility].

## 2013-02-28 ENCOUNTER — Ambulatory Visit (INDEPENDENT_AMBULATORY_CARE_PROVIDER_SITE_OTHER): Payer: Medicare Other | Admitting: Family Medicine

## 2013-02-28 DIAGNOSIS — G4730 Sleep apnea, unspecified: Secondary | ICD-10-CM

## 2013-02-28 NOTE — Progress Notes (Signed)
  Subjective:    Patient ID: Curtis Riley, male    DOB: 1934/11/23, 78 y.o.   MRN: [id number]  HPI He is here for consult concerning the need for a new CPAP. He did have it evaluated at [facility] and apparently the motor is malfunctioning. He will need replacement.  Review of Systems     Objective:   Physical Exam Alert and in no distress otherwise not examined       Assessment & Plan:  Sleep apnea  I will write for a new machine and also auto titrate for better control.

## 2013-03-17 ENCOUNTER — Ambulatory Visit: Payer: Medicare Other | Admitting: Cardiovascular Disease

## 2013-03-22 ENCOUNTER — Encounter: Payer: Self-pay | Admitting: *Deleted

## 2013-03-29 ENCOUNTER — Encounter: Payer: Self-pay | Admitting: Internal Medicine

## 2013-03-29 ENCOUNTER — Ambulatory Visit (INDEPENDENT_AMBULATORY_CARE_PROVIDER_SITE_OTHER): Payer: Medicare Other | Admitting: Internal Medicine

## 2013-03-29 VITALS — BP 130/92 | HR 66 | Ht 76.0 in | Wt 263.9 lb

## 2013-03-29 DIAGNOSIS — I209 Angina pectoris, unspecified: Secondary | ICD-10-CM | POA: Diagnosis not present

## 2013-03-29 DIAGNOSIS — R079 Chest pain, unspecified: Secondary | ICD-10-CM | POA: Diagnosis not present

## 2013-03-29 DIAGNOSIS — E785 Hyperlipidemia, unspecified: Secondary | ICD-10-CM | POA: Diagnosis not present

## 2013-03-29 DIAGNOSIS — I201 Angina pectoris with documented spasm: Secondary | ICD-10-CM | POA: Insufficient documentation

## 2013-03-29 MED ORDER — NITROGLYCERIN 0.4 MG SL SUBL: 0.4000 mg | SUBLINGUAL_TABLET | SUBLINGUAL | Status: DC | PRN

## 2013-03-29 NOTE — Patient Instructions (Addendum)
Your physician wants you to follow-up in: 1 year. You will receive a reminder letter in the mail two months in advance. If you don't receive a letter, please call our office to schedule the follow-up appointment.  

## 2013-03-29 NOTE — Progress Notes (Signed)
OFFICE NOTE  Chief Complaint:  Routine followup  Primary Care Physician: Carollee Herter, MD  HPI:  Curtis Riley is a 78 year old gentleman with a history of cervical neuropathy and recent C3-C4 surgery. He also has dyslipidemia, hypertension, sleep apnea, on CPAP, and mild COPD. He underwent catheterization, as you know, in 2012 for chest pain. It was negative except for a small bridging segment in the LAD. He occasionally reports short-lived chest pain, mostly at rest, which resolves spontaneously. He denies worsening dyspnea. He does not have nitrates for his chest pain.  PMHx:  Past Medical History  Diagnosis Date  . BPH (benign prostatic hyperplasia)   . Smoker   . CVA (cerebral infarction)   . Hypertension   . COPD (chronic obstructive pulmonary disease)   . Diverticulosis   . Angina     occasional chest pain  never been treated for this ...   . Shortness of breath     with exertion   . Sleep apnea     uses cpap  . Kidney stones     10-15 yrs ago  . Neuromuscular disorder   . Arthritis   . Dyslipidemia   . Family history of coronary artery disease     brothers, sisters  . History of nuclear stress test 03/2010    dobutamine; mild ischemia in mid inferior and apical inferior regions; ekg negative for ischemia; low risk scan     Past Surgical History  Procedure Laterality Date  . Transurethral resection of prostate  08/1996  . Transurethral resection of prostate      long time ago per pt.  10-15 yrs  . Cardiac catheterization  10/2010    small bridging segment in LAD  . Tonsillectomy  age 5  . Anterior cervical decomp/discectomy fusion  10/06/2011    Procedure: ANTERIOR CERVICAL DECOMPRESSION/DISCECTOMY FUSION 3 LEVELS;  Surgeon: Reinaldo Meeker, MD;  Location: [facility];  Service: Neurosurgery;  Laterality: Bilateral;  Cervical three-four, four-five, five-six anterior cervical decompression, fusion  . Transthoracic echocardiogram  2004    normal LV  size and function, aortic root dilated    FAMHx:  Family History  Problem Relation Age of Onset  . Arthritis Mother   . Hypertension Mother   . Arthritis Father   . Hypertension Father   . Arthritis Sister   . Diabetes Sister   . Hypertension Sister   . Arthritis Brother   . Hypertension Brother     SOCHx:   reports that he quit smoking about 14 years ago. His smoking use included Cigarettes. He smoked 0.00 packs per day for 20 years. He has never used smokeless tobacco. He reports that he does not drink alcohol or use illicit drugs.  ALLERGIES:  Allergies  Allergen Reactions  . Penicillins Anaphylaxis    ROS: A comprehensive review of systems was negative except for: Respiratory: positive for dyspnea on exertion Cardiovascular: positive for chest pain  HOME MEDS: Current Outpatient Prescriptions  Medication Sig Dispense Refill  . aspirin 81 MG tablet Take 81 mg by mouth daily.      . carvedilol (COREG) 12.5 MG tablet Take 25 mg by mouth 2 (two) times daily with a meal.       . COMBIVENT RESPIMAT 20-100 MCG/ACT AERS respimat INHALE 1 PUFF INTO THE LUNGS EVERY 6 HOURS  3 Inhaler  1  . furosemide (LASIX) 40 MG tablet Take 40 mg by mouth. Every other day      . lisinopril (PRINIVIL,ZESTRIL)  40 MG tablet Take 20 mg by mouth daily.       . Multiple Vitamins-Minerals (MULTIVITAMIN WITH MINERALS) tablet Take 1 tablet by mouth daily.        . NON FORMULARY at bedtime. CPAP      . potassium chloride (K-DUR) 10 MEQ tablet Take 10 mEq by mouth. Every other day      . nitroGLYCERIN (NITROSTAT) 0.4 MG SL tablet Place 1 tablet (0.4 mg total) under the tongue every 5 (five) minutes as needed for chest pain.  25 tablet  4   No current facility-administered medications for this visit.    LABS/IMAGING: No results found for this or any previous visit (from the past 48 hour(s)). No results found.  VITALS: BP 130/92  Pulse 66  Ht 6\' 4"  (1.93 m)  Wt 263 lb 14.4 oz (119.704 kg)  BMI  32.14 kg/m2  EXAM: General appearance: alert and no distress Neck: no adenopathy, no carotid bruit, no JVD, supple, symmetrical, trachea midline and thyroid not enlarged, symmetric, no tenderness/mass/nodules Lungs: clear to auscultation bilaterally Heart: regular rate and rhythm, S1, S2 normal, no murmur, click, rub or gallop Abdomen: soft, non-tender; bowel sounds normal; no masses,  no organomegaly Extremities: extremities normal, atraumatic, no cyanosis or edema Pulses: 2+ and symmetric Skin: Skin color, texture, turgor normal. No rashes or lesions Neurologic: Grossly normal  EKG: Normal sinus rhythm at 66  ASSESSMENT: 1. Hypertension-controlled 2. Dyslipidemia-taken off statins 3. COPD 4. Intermittent coronary spasm  PLAN: 1.   Mr. Corbo is doing fairly well at this time. His blood pressure is well controlled and he has very infrequent episodes of chest discomfort mostly at rest. This could represent coronary spasm. I went ahead and prescribed him for low-dose nitroglycerin to have in case his symptoms do not get better quickly. Otherwise we'll continue his current medications and plan to see him back annually.  Chrystie Nose, MD, [facility] Attending Cardiologist [facility]  HILTY,Kenneth C 03/29/2013, 2:49 PM

## 2013-04-13 DIAGNOSIS — K625 Hemorrhage of anus and rectum: Secondary | ICD-10-CM | POA: Diagnosis not present

## 2013-04-13 DIAGNOSIS — K5901 Slow transit constipation: Secondary | ICD-10-CM | POA: Diagnosis not present

## 2013-05-09 ENCOUNTER — Other Ambulatory Visit (INDEPENDENT_AMBULATORY_CARE_PROVIDER_SITE_OTHER): Payer: Medicare Other

## 2013-05-09 DIAGNOSIS — Z23 Encounter for immunization: Secondary | ICD-10-CM

## 2013-05-27 ENCOUNTER — Other Ambulatory Visit: Payer: Self-pay | Admitting: Internal Medicine

## 2013-05-29 NOTE — Telephone Encounter (Signed)
Rx was sent to pharmacy electronically. 

## 2013-07-03 DIAGNOSIS — Z86010 Personal history of colonic polyps: Secondary | ICD-10-CM | POA: Diagnosis not present

## 2013-07-03 DIAGNOSIS — K5901 Slow transit constipation: Secondary | ICD-10-CM | POA: Diagnosis not present

## 2013-08-17 DIAGNOSIS — Z86010 Personal history of colonic polyps: Secondary | ICD-10-CM | POA: Diagnosis not present

## 2013-08-17 DIAGNOSIS — Z09 Encounter for follow-up examination after completed treatment for conditions other than malignant neoplasm: Secondary | ICD-10-CM | POA: Diagnosis not present

## 2013-09-06 ENCOUNTER — Other Ambulatory Visit: Payer: Self-pay | Admitting: Family Medicine

## 2013-09-07 ENCOUNTER — Encounter: Payer: Self-pay | Admitting: Family Medicine

## 2013-09-13 ENCOUNTER — Telehealth: Payer: Self-pay | Admitting: Family Medicine

## 2013-09-13 MED ORDER — POTASSIUM CHLORIDE ER 10 MEQ PO TBCR: 10.0000 meq | EXTENDED_RELEASE_TABLET | Freq: Every day | ORAL | Status: DC

## 2013-09-13 NOTE — Telephone Encounter (Signed)
Pt has appt for fasting medcheck middle of march. Pt needs refill on potassium chloride sent to LOCAL PHARM[facility]Y. LOCAL [facility]Y IS [facility] on [address].

## 2013-09-13 NOTE — Telephone Encounter (Signed)
Rx sent for #30 only just to hold him until his appt.

## 2013-09-26 ENCOUNTER — Ambulatory Visit (INDEPENDENT_AMBULATORY_CARE_PROVIDER_SITE_OTHER): Payer: Medicare Other | Admitting: Internal Medicine

## 2013-09-26 ENCOUNTER — Encounter: Payer: Self-pay | Admitting: Internal Medicine

## 2013-09-26 VITALS — BP 142/88 | HR 63 | Ht 76.0 in | Wt 260.9 lb

## 2013-09-26 DIAGNOSIS — I201 Angina pectoris with documented spasm: Secondary | ICD-10-CM

## 2013-09-26 DIAGNOSIS — I10 Essential (primary) hypertension: Secondary | ICD-10-CM

## 2013-09-26 DIAGNOSIS — E785 Hyperlipidemia, unspecified: Secondary | ICD-10-CM

## 2013-09-26 DIAGNOSIS — I209 Angina pectoris, unspecified: Secondary | ICD-10-CM | POA: Diagnosis not present

## 2013-09-26 DIAGNOSIS — J449 Chronic obstructive pulmonary disease, unspecified: Secondary | ICD-10-CM

## 2013-09-26 MED ORDER — LISINOPRIL 40 MG PO TABS: 40.0000 mg | ORAL_TABLET | Freq: Every day | ORAL | Status: DC

## 2013-09-26 MED ORDER — POTASSIUM CHLORIDE ER 10 MEQ PO TBCR: 10.0000 meq | EXTENDED_RELEASE_TABLET | ORAL | Status: DC

## 2013-09-26 MED ORDER — FUROSEMIDE 40 MG PO TABS: ORAL_TABLET | ORAL | Status: DC

## 2013-09-26 MED ORDER — CARVEDILOL 12.5 MG PO TABS: 25.0000 mg | ORAL_TABLET | Freq: Two times a day (BID) | ORAL | Status: DC

## 2013-09-26 NOTE — Progress Notes (Signed)
OFFICE NOTE  Chief Complaint:  Routine followup  Primary Care Physician: Wyatt Haste, MD  HPI:  Curtis Riley is a 79 year old gentleman with a history of cervical neuropathy and recent C3-C4 surgery. He also has dyslipidemia, hypertension, sleep apnea, on CPAP, and mild COPD. He underwent catheterization, as you know, in 2012 for chest pain. It was negative except for a small bridging segment in the LAD. When I last saw him he had 2 episodes of chest discomfort and I prescribed some nitroglycerin. He reports that he has not needed to take that in the past 6 months. He has had some increase in mucus and secretions related to his COPD and shortness of breath. He feels that that could be better controlled and is planning to see his primary care provider about that. Overall I think he is doing very well.  PMHx:  Past Medical History  Diagnosis Date  . BPH (benign prostatic hyperplasia)   . Smoker   . CVA (cerebral infarction)   . Hypertension   . COPD (chronic obstructive pulmonary disease)   . Diverticulosis   . Angina     occasional chest pain  never been treated for this ...   . Shortness of breath     with exertion   . Sleep apnea     uses cpap  . Kidney stones     10-15 yrs ago  . Neuromuscular disorder   . Arthritis   . Dyslipidemia   . Family history of coronary artery disease     brothers, sisters  . History of nuclear stress test 03/2010    dobutamine; mild ischemia in mid inferior and apical inferior regions; ekg negative for ischemia; low risk scan     Past Surgical History  Procedure Laterality Date  . Transurethral resection of prostate  08/1996  . Transurethral resection of prostate      long time ago per pt.  10-15 yrs  . Cardiac catheterization  10/2010    small bridging segment in LAD  . Tonsillectomy  age 55  . Anterior cervical decomp/discectomy fusion  10/06/2011    Procedure: ANTERIOR CERVICAL DECOMPRESSION/DISCECTOMY FUSION 3 LEVELS;   Surgeon: Faythe Ghee, MD;  Location: [facility];  Service: Neurosurgery;  Laterality: Bilateral;  Cervical three-four, four-five, five-six anterior cervical decompression, fusion  . Transthoracic echocardiogram  2004    normal LV size and function, aortic root dilated    FAMHx:  Family History  Problem Relation Age of Onset  . Arthritis Mother   . Hypertension Mother   . Arthritis Father   . Hypertension Father   . Arthritis Sister   . Diabetes Sister   . Hypertension Sister   . Arthritis Brother   . Hypertension Brother     SOCHx:   reports that he quit smoking about 15 years ago. His smoking use included Cigarettes. He smoked 0.00 packs per day for 20 years. He has never used smokeless tobacco. He reports that he does not drink alcohol or use illicit drugs.  ALLERGIES:  Allergies  Allergen Reactions  . Penicillins Anaphylaxis    ROS: A comprehensive review of systems was negative except for: Respiratory: positive for dyspnea on exertion  HOME MEDS: Current Outpatient Prescriptions  Medication Sig Dispense Refill  . aspirin 81 MG tablet Take 81 mg by mouth daily.      . carvedilol (COREG) 12.5 MG tablet Take 2 tablets (25 mg total) by mouth 2 (two) times daily with a meal.  180 tablet  3  . COMBIVENT RESPIMAT 20-100 MCG/ACT AERS respimat INHALE 1 PUFF INTO THE LUNGS EVERY 6 HOURS  3 Inhaler  1  . furosemide (LASIX) 40 MG tablet TAKE 1 TABLET EVERY OTHER DAY  45 tablet  3  . lisinopril (PRINIVIL,ZESTRIL) 40 MG tablet Take 1 tablet (40 mg total) by mouth daily.  90 tablet  3  . Multiple Vitamins-Minerals (MULTIVITAMIN WITH MINERALS) tablet Take 1 tablet by mouth daily.        . nitroGLYCERIN (NITROSTAT) 0.4 MG SL tablet Place 1 tablet (0.4 mg total) under the tongue every 5 (five) minutes as needed for chest pain.  25 tablet  4  . NON FORMULARY at bedtime. CPAP      . potassium chloride (K-DUR) 10 MEQ tablet Take 1 tablet (10 mEq total) by mouth every other day.  45  tablet  3   No current facility-administered medications for this visit.    LABS/IMAGING: No results found for this or any previous visit (from the past 48 hour(s)). No results found.  VITALS: BP 142/88  Pulse 63  Ht 6\' 4"  (1.93 m)  Wt 260 lb 14.4 oz (118.343 kg)  BMI 31.77 kg/m2  EXAM: General appearance: alert and no distress Neck: no adenopathy, no carotid bruit, no JVD, supple, symmetrical, trachea midline and thyroid not enlarged, symmetric, no tenderness/mass/nodules Lungs: clear to auscultation bilaterally Heart: regular rate and rhythm, S1, S2 normal, no murmur, click, rub or gallop Abdomen: soft, non-tender; bowel sounds normal; no masses,  no organomegaly Extremities: extremities normal, atraumatic, no cyanosis or edema Pulses: 2+ and symmetric Skin: Skin color, texture, turgor normal. No rashes or lesions Neurologic: Grossly normal  EKG: Normal sinus rhythm at 66  ASSESSMENT: 1. Hypertension-controlled 2. Dyslipidemia-taken off statins 3. COPD 4. Intermittent coronary spasm  PLAN: 1.   Curtis Riley is doing fairly well at this time. His blood pressure is well controlled and he has very infrequent episodes of chest discomfort mostly at rest. His main issue is some increase in shortness of breath which may be seasonal and I believe is related to his COPD. He could benefit from a long-acting inhaled steroid or anticholinergic. He has had some concerns about inhaled steroids and has declined using them. He reports he was previously on Spiriva for some period of time. Plan to see him back in 6 months or sooner as necessary.  Pixie Casino, MD, [facility] Attending Cardiologist [facility] 09/26/2013, 4:52 PM

## 2013-09-26 NOTE — Patient Instructions (Signed)
Return in 6 months

## 2013-09-28 ENCOUNTER — Other Ambulatory Visit: Payer: Self-pay | Admitting: *Deleted

## 2013-09-28 MED ORDER — CARVEDILOL 25 MG PO TABS: 25.0000 mg | ORAL_TABLET | Freq: Two times a day (BID) | ORAL | Status: DC

## 2013-10-04 ENCOUNTER — Encounter: Payer: Self-pay | Admitting: Family Medicine

## 2013-10-04 ENCOUNTER — Ambulatory Visit (INDEPENDENT_AMBULATORY_CARE_PROVIDER_SITE_OTHER): Payer: Medicare Other | Admitting: Family Medicine

## 2013-10-04 VITALS — BP 130/90 | HR 80 | Wt 260.0 lb

## 2013-10-04 DIAGNOSIS — I209 Angina pectoris, unspecified: Secondary | ICD-10-CM | POA: Diagnosis not present

## 2013-10-04 DIAGNOSIS — G4733 Obstructive sleep apnea (adult) (pediatric): Secondary | ICD-10-CM | POA: Insufficient documentation

## 2013-10-04 DIAGNOSIS — J449 Chronic obstructive pulmonary disease, unspecified: Secondary | ICD-10-CM

## 2013-10-04 DIAGNOSIS — I10 Essential (primary) hypertension: Secondary | ICD-10-CM

## 2013-10-04 DIAGNOSIS — E785 Hyperlipidemia, unspecified: Secondary | ICD-10-CM | POA: Diagnosis not present

## 2013-10-04 DIAGNOSIS — I201 Angina pectoris with documented spasm: Secondary | ICD-10-CM

## 2013-10-04 DIAGNOSIS — J310 Chronic rhinitis: Secondary | ICD-10-CM

## 2013-10-04 LAB — COMPREHENSIVE METABOLIC PANEL
ALT: 13 U/L (ref 0–53)
AST: 17 U/L (ref 0–37)
Albumin: 3.6 g/dL (ref 3.5–5.2)
Alkaline Phosphatase: 74 U/L (ref 39–117)
BUN: 13 mg/dL (ref 6–23)
CO2: 29 mEq/L (ref 19–32)
Calcium: 9.5 mg/dL (ref 8.4–10.5)
Chloride: 107 mEq/L (ref 96–112)
Creat: 1.01 mg/dL (ref 0.50–1.35)
Glucose, Bld: 93 mg/dL (ref 70–99)
Potassium: 4.7 mEq/L (ref 3.5–5.3)
Sodium: 141 mEq/L (ref 135–145)
Total Bilirubin: 0.8 mg/dL (ref 0.2–1.2)
Total Protein: 6.8 g/dL (ref 6.0–8.3)

## 2013-10-04 LAB — CBC WITH DIFFERENTIAL/PLATELET
BASOS ABS: 0 10*3/uL (ref 0.0–0.1)
Basophils Relative: 0 % (ref 0–1)
EOS ABS: 0.1 10*3/uL (ref 0.0–0.7)
EOS PCT: 2 % (ref 0–5)
HCT: 43.2 % (ref 39.0–52.0)
Hemoglobin: 14.2 g/dL (ref 13.0–17.0)
Lymphocytes Relative: 29 % (ref 12–46)
Lymphs Abs: 1.4 10*3/uL (ref 0.7–4.0)
MCH: 30.9 pg (ref 26.0–34.0)
MCHC: 32.9 g/dL (ref 30.0–36.0)
MCV: 93.9 fL (ref 78.0–100.0)
Monocytes Absolute: 0.5 10*3/uL (ref 0.1–1.0)
Monocytes Relative: 11 % (ref 3–12)
Neutro Abs: 2.8 10*3/uL (ref 1.7–7.7)
Neutrophils Relative %: 58 % (ref 43–77)
PLATELETS: 226 10*3/uL (ref 150–400)
RBC: 4.6 MIL/uL (ref 4.22–5.81)
RDW: 12.9 % (ref 11.5–15.5)
WBC: 4.8 10*3/uL (ref 4.0–10.5)

## 2013-10-04 LAB — LIPID PANEL
CHOL/HDL RATIO: 3.1 ratio
CHOLESTEROL: 149 mg/dL (ref 0–200)
HDL: 48 mg/dL (ref >39)
LDL Cholesterol: 84 mg/dL (ref 0–99)
Triglycerides: 83 mg/dL (ref <150)
VLDL: 17 mg/dL (ref 0–40)

## 2013-10-04 MED ORDER — IPRATROPIUM-ALBUTEROL 20-100 MCG/ACT IN AERS: INHALATION_SPRAY | RESPIRATORY_TRACT | Status: DC

## 2013-10-04 NOTE — Progress Notes (Signed)
   Subjective:    Patient ID: Curtis Riley, male    DOB: [birth year], 79 y.o.   MRN: [id number]  HPI He is here for an interval evaluation. He does have underlying COPD and has been on Combivent for several years. He has noted over the last year, increased difficulty with shortness of breath. He also has OSA and is on CPAP. He apparently had a recent readout although I cannot find the report. He also has a history of heart disease but has not had any difficulty with chest pain, diaphoresis, PND. He does complain of a runny nose especially with eating. Otherwise he has no complaints. Review his record indicates he has not had blood work in quite some time.   Review of Systems     Objective:   Physical Exam Alert and in no distress. Cardiac exam shows regular rhythm without murmurs or gallops. Lungs are clear to auscultation.       Assessment & Plan:  OSA (obstructive sleep apnea) - Plan: Ambulatory referral to Pulmonology  COPD (chronic obstructive pulmonary disease) - Plan: Ambulatory referral to Pulmonology, Ipratropium-Albuterol (COMBIVENT RESPIMAT) 20-100 MCG/ACT AERS respimat  Coronary artery spasm - Plan: CBC with Differential, Comprehensive metabolic panel, Lipid panel  Dyslipidemia - Plan: Lipid panel  Hypertension - Plan: CBC with Differential, Comprehensive metabolic panel  Gustatory rhinitis  discussed the fact that it's time to get more formal pulmonary evaluation for the possible use of other medications including oxygen. Recommend he use an antihistamine to see if that will help with his rhinitis. He will continue on his other medications.

## 2013-10-04 NOTE — Patient Instructions (Signed)
Try Claritin or Allegra to see if that will help with the runny nose with eating

## 2013-10-11 ENCOUNTER — Telehealth: Payer: Self-pay | Admitting: Family Medicine

## 2013-10-11 NOTE — Telephone Encounter (Signed)
Please call, patient states he was supposed to be referred to have hs breathing evaluated and he has not heard anything

## 2013-10-12 NOTE — Telephone Encounter (Signed)
Lm on pt VM informing him that we have sent the referral so he has to wait for Pulmonology to call him and schedule appt

## 2013-11-14 ENCOUNTER — Encounter: Payer: Self-pay | Admitting: Pulmonary Disease

## 2013-11-14 ENCOUNTER — Ambulatory Visit (INDEPENDENT_AMBULATORY_CARE_PROVIDER_SITE_OTHER): Payer: Medicare Other | Admitting: Pulmonary Disease

## 2013-11-14 ENCOUNTER — Ambulatory Visit (INDEPENDENT_AMBULATORY_CARE_PROVIDER_SITE_OTHER)
Admission: RE | Admit: 2013-11-14 | Discharge: 2013-11-14 | Disposition: A | Discharge status: Home / Self Care | Payer: Medicare Other | Source: Ambulatory Visit | Attending: Pulmonary Disease | Admitting: Pulmonary Disease

## 2013-11-14 ENCOUNTER — Ambulatory Visit
Admission: RE | Admit: 2013-11-14 | Discharge: 2013-11-14 | Disposition: A | Discharge status: Home / Self Care | Payer: Medicare Other | Source: Ambulatory Visit | Attending: Pulmonary Disease | Admitting: Pulmonary Disease

## 2013-11-14 VITALS — BP 120/76 | HR 65 | Ht 76.0 in | Wt 264.0 lb

## 2013-11-14 DIAGNOSIS — I209 Angina pectoris, unspecified: Secondary | ICD-10-CM

## 2013-11-14 DIAGNOSIS — J449 Chronic obstructive pulmonary disease, unspecified: Secondary | ICD-10-CM

## 2013-11-14 DIAGNOSIS — G4733 Obstructive sleep apnea (adult) (pediatric): Secondary | ICD-10-CM

## 2013-11-14 DIAGNOSIS — R0602 Shortness of breath: Secondary | ICD-10-CM | POA: Diagnosis not present

## 2013-11-14 DIAGNOSIS — J4489 Other specified chronic obstructive pulmonary disease: Secondary | ICD-10-CM

## 2013-11-14 MED ORDER — FLUTICASONE FUROATE-VILANTEROL 100-25 MCG/INH IN AEPB: 1.0000 | INHALATION_SPRAY | Freq: Every day | RESPIRATORY_TRACT | Status: DC

## 2013-11-14 NOTE — Progress Notes (Signed)
Chief Complaint  Patient presents with  . Sleep Consult    referred by Dr. Redmond School for OSA. Epworth Score: 16.    History of Present Illness: Curtis Riley is a 79 y.o. male former smoker for  evaluation of emphysema and sleep apnea.  He has history of sleep apnea.  His sleep study was done years ago.  He has been using CPAP 2007.  He got a new machine last year.  He does not recall having any change to his pressure setting.  He goes to bed at midnight.  He falls asleep quickly.  He does not use anything to help him sleep.  He wakes up occasionally to use the bathroom.  He gets out of bed at 9 am.  He feels tired in the morning.  His Epworth score is 16.  The patient denies sleep walking, sleep talking, bruxism, or nightmares.  There is no history of restless legs.  The patient denies sleep hallucinations, sleep paralysis, or cataplexy.  He has noticed progressive symptoms of dyspnea.  He can now only walk about 50 feet before getting short of breath.  He has more cough and wheeze as well.  He brings up clear sputum.  He denies fever, or hemoptysis.  There is no history of asthma or allergies.  He started smoking as a teen ager.  He smoked 1 pack per day, and quit in 2003.  He worked as an Clinical biochemist.  He was in [employer] for 20 yrs >> stationed in [place], [place], [place], and [place].  There is no history of pneumonia or exposure to tuberculosis.  He has been using combivent 4 times per day >> this helps, and he feels like he needs to use it this often.  He had lab tests about 1 month ago, but no recent chest xray.   Tests: PSG 04/07/06 >> AHI 19, SaO2 low 84% CPAP 05/19/06 >> CPAP 12 cm H2O  Oleh Genin  has a past medical history of BPH (benign prostatic hyperplasia); Smoker; CVA (cerebral infarction); Hypertension; COPD (chronic obstructive pulmonary disease); Diverticulosis; Angina; Shortness of breath; Sleep apnea; Kidney stones; Neuromuscular disorder; Arthritis;  Dyslipidemia; Family history of coronary artery disease; and History of nuclear stress test (03/2010).  RAYNOLD BLANKENBAKER  has past surgical history that includes Transurethral resection of prostate (08/1996); Transurethral resection of prostate; Cardiac catheterization (10/2010); Tonsillectomy (age 57); Anterior cervical decomp/discectomy fusion (10/06/2011); and transthoracic echocardiogram (2004).  Prior to Admission medications   Medication Sig Start Date End Date Taking? Authorizing Provider  aspirin 81 MG tablet Take 81 mg by mouth daily.   Yes Historical Provider, MD  carvedilol (COREG) 25 MG tablet Take 1 tablet (25 mg total) by mouth 2 (two) times daily. 09/28/13  Yes Pixie Casino, MD  furosemide (LASIX) 40 MG tablet TAKE 1 TABLET EVERY OTHER DAY 09/26/13  Yes Pixie Casino, MD  Ipratropium-Albuterol (COMBIVENT RESPIMAT) 20-100 MCG/ACT AERS respimat INHALE 1 PUFF INTO THE LUNGS EVERY 6 HOURS 10/04/13  Yes Denita Lung, MD  lisinopril (PRINIVIL,ZESTRIL) 40 MG tablet Take 1 tablet (40 mg total) by mouth daily. 09/26/13  Yes Pixie Casino, MD  nitroGLYCERIN (NITROSTAT) 0.4 MG SL tablet Place 1 tablet (0.4 mg total) under the tongue every 5 (five) minutes as needed for chest pain. 03/29/13  Yes Pixie Casino, MD  NON FORMULARY at bedtime. CPAP   Yes Historical Provider, MD  potassium chloride (K-DUR) 10 MEQ tablet Take 1 tablet (10 mEq total)  by mouth every other day. 09/26/13  Yes Pixie Casino, MD    Allergies  Allergen Reactions  . Penicillins Anaphylaxis    His family history includes Arthritis in his brother, father, mother, and sister; Diabetes in his sister; Hypertension in his brother, father, mother, and sister.  He  reports that he quit smoking about 15 years ago. His smoking use included Cigarettes. He has a 20 pack-year smoking history. He has never used smokeless tobacco. He reports that he does not drink alcohol or use illicit drugs.  Review of Systems   Constitutional: Positive for unexpected weight change. Negative for fever, chills, diaphoresis, activity change, appetite change and fatigue.  HENT: Positive for congestion and dental problem. Negative for ear discharge, ear pain, facial swelling, hearing loss, mouth sores, nosebleeds, postnasal drip, rhinorrhea, sinus pressure, sneezing, sore throat, tinnitus, trouble swallowing and voice change.   Eyes: Negative for photophobia, discharge, itching and visual disturbance.  Respiratory: Positive for cough and shortness of breath. Negative for apnea, choking, chest tightness, wheezing and stridor.   Cardiovascular: Negative for chest pain, palpitations and leg swelling.  Gastrointestinal: Negative for nausea, vomiting, abdominal pain, constipation, blood in stool and abdominal distention.  Genitourinary: Negative for dysuria, urgency, frequency, hematuria, flank pain, decreased urine volume and difficulty urinating.  Musculoskeletal: Positive for arthralgias and joint swelling. Negative for back pain, gait problem, myalgias, neck pain and neck stiffness.  Skin: Negative for color change, pallor and rash.  Neurological: Negative for dizziness, tremors, seizures, syncope, speech difficulty, weakness, light-headedness, numbness and headaches.  Hematological: Negative for adenopathy. Does not bruise/bleed easily.  Psychiatric/Behavioral: Negative for confusion, sleep disturbance and agitation. The patient is not nervous/anxious.    Physical Exam:  General - No distress ENT - No sinus tenderness, no oral exudate, no LAN, no thyromegaly, TM clear, pupils equal/reactive Cardiac - s1s2 regular, no murmur, pulses symmetric Chest - No wheeze/rales/dullness, good air entry, normal respiratory excursion Back - No focal tenderness Abd - Soft, non-tender, no organomegaly, + bowel sounds Ext - No edema Neuro - Normal strength, cranial nerves intact Skin - No rashes Psych - Normal mood, and  behavior   Lab Results  Component Value Date   WBC 4.8 10/04/2013   HGB 14.2 10/04/2013   HCT 43.2 10/04/2013   MCV 93.9 10/04/2013   PLT 226 10/04/2013    Lab Results  Component Value Date   CREATININE 1.01 10/04/2013   BUN 13 10/04/2013   NA 141 10/04/2013   K 4.7 10/04/2013   CL 107 10/04/2013   CO2 29 10/04/2013    Lab Results  Component Value Date   ALT 13 10/04/2013   AST 17 10/04/2013   ALKPHOS 74 10/04/2013   BILITOT 0.8 10/04/2013    Assessment/Plan:  Chesley Mires, MD [place] [facility]/Critical Care/Sleep Pager:  [phone]

## 2013-11-14 NOTE — Patient Instructions (Signed)
Chest xray today Will get copy of lab tests from Dr. Lanice Shirts office Will arrange for breathing test (PFT) Bring CPAP memory card to be reviewed Breo one puff daily >> rinse mouth after each use Combivent two puffs up to four times per day as needed for cough, wheeze, or chest congestion Will arrange for overnight oxygen test Follow up in 4 weeks

## 2013-11-14 NOTE — Progress Notes (Deleted)
   Subjective:    Patient ID: Curtis Riley, male    DOB: 03-Jul-1935, 79 y.o.   MRN: [id number]  HPI    Review of Systems  Constitutional: Positive for unexpected weight change. Negative for fever, chills, diaphoresis, activity change, appetite change and fatigue.  HENT: Positive for congestion and dental problem. Negative for ear discharge, ear pain, facial swelling, hearing loss, mouth sores, nosebleeds, postnasal drip, rhinorrhea, sinus pressure, sneezing, sore throat, tinnitus, trouble swallowing and voice change.   Eyes: Negative for photophobia, discharge, itching and visual disturbance.  Respiratory: Positive for cough and shortness of breath. Negative for apnea, choking, chest tightness, wheezing and stridor.   Cardiovascular: Negative for chest pain, palpitations and leg swelling.  Gastrointestinal: Negative for nausea, vomiting, abdominal pain, constipation, blood in stool and abdominal distention.  Genitourinary: Negative for dysuria, urgency, frequency, hematuria, flank pain, decreased urine volume and difficulty urinating.  Musculoskeletal: Positive for arthralgias and joint swelling. Negative for back pain, gait problem, myalgias, neck pain and neck stiffness.  Skin: Negative for color change, pallor and rash.  Neurological: Negative for dizziness, tremors, seizures, syncope, speech difficulty, weakness, light-headedness, numbness and headaches.  Hematological: Negative for adenopathy. Does not bruise/bleed easily.  Psychiatric/Behavioral: Negative for confusion, sleep disturbance and agitation. The patient is not nervous/anxious.        Objective:   Physical Exam        Assessment & Plan:

## 2013-11-17 ENCOUNTER — Telehealth: Payer: Self-pay | Admitting: Pulmonary Disease

## 2013-11-17 ENCOUNTER — Other Ambulatory Visit: Payer: Self-pay | Admitting: *Deleted

## 2013-11-17 MED ORDER — FLUTICASONE FUROATE-VILANTEROL 100-25 MCG/INH IN AEPB: 1.0000 | INHALATION_SPRAY | Freq: Every day | RESPIRATORY_TRACT | Status: DC

## 2013-11-17 NOTE — Telephone Encounter (Addendum)
Pt is aware of results. 

## 2013-11-17 NOTE — Telephone Encounter (Signed)
Dg Chest 2 View  11/14/2013   CLINICAL DATA:  Short of breath  EXAM: CHEST  2 VIEW  COMPARISON:  03/27/2011  FINDINGS: Cardiac silhouette is normal in size. The aorta is uncoiled. No mediastinal or hilar masses.  Lungs are hyperexpanded but clear. There is a relative decrease in vascular markings in the upper lobes suggesting emphysema. This is stable.  No pleural effusion.  No pneumothorax.  Bony thorax is intact.  IMPRESSION: 1. No acute cardiopulmonary disease. 2. Probable emphysema.   Electronically Signed   By: Lajean Manes M.D.   On: 11/14/2013 15:04   Will have my nurse inform pt that CXR shows changes suggestive of emphysema.  Will discuss in more detail at next ROV after review of breathing tests.  No change to current treatment plan.

## 2013-11-19 ENCOUNTER — Encounter: Payer: Self-pay | Admitting: Pulmonary Disease

## 2013-11-19 NOTE — Assessment & Plan Note (Signed)
He has extensive history of smoking.  He has symptoms suggestive of COPD.  He reports benefit from inhaler therapy.  Will try him on Breo and have him use combivent prn only.  Will get PFT and CXR to further assess.

## 2013-11-19 NOTE — Assessment & Plan Note (Addendum)
He has history of sleep apnea.  Will have him bring copy of his CPAP card to review, and then determine if he needs adjustment to his set up.  Will also arrange for overnight oxygen test to determine if he could have hypoventilation at night also.

## 2013-11-20 ENCOUNTER — Other Ambulatory Visit: Payer: Self-pay | Admitting: *Deleted

## 2013-11-20 MED ORDER — FLUTICASONE-SALMETEROL 250-50 MCG/DOSE IN AEPB: 1.0000 | INHALATION_SPRAY | Freq: Two times a day (BID) | RESPIRATORY_TRACT | Status: DC

## 2013-11-22 DIAGNOSIS — G4733 Obstructive sleep apnea (adult) (pediatric): Secondary | ICD-10-CM | POA: Diagnosis not present

## 2013-11-22 DIAGNOSIS — J449 Chronic obstructive pulmonary disease, unspecified: Secondary | ICD-10-CM | POA: Diagnosis not present

## 2013-12-05 ENCOUNTER — Telehealth: Payer: Self-pay | Admitting: Pulmonary Disease

## 2013-12-05 DIAGNOSIS — G4733 Obstructive sleep apnea (adult) (pediatric): Secondary | ICD-10-CM

## 2013-12-05 NOTE — Telephone Encounter (Signed)
ONO with CPAP and room air 11/22/13 >> test time 8 hrs 2 min.  Mean SpO2 90.1%, low SpO2 84%.  Spent 1 hr 15 min with SpO2 < 88%.  Will have my nurse inform pt that ONO shows low oxygen level at night.  Will get copy of his CPAP download and call him with results.  Will then discuss what changes need to be made to his set up.

## 2013-12-05 NOTE — Telephone Encounter (Addendum)
Pt is aware of results. 

## 2013-12-25 ENCOUNTER — Ambulatory Visit (INDEPENDENT_AMBULATORY_CARE_PROVIDER_SITE_OTHER): Payer: Medicare Other | Admitting: Pulmonary Disease

## 2013-12-25 ENCOUNTER — Encounter: Payer: Self-pay | Admitting: Pulmonary Disease

## 2013-12-25 VITALS — BP 142/88 | HR 69 | Temp 97.1°F | Ht 73.0 in | Wt 259.9 lb

## 2013-12-25 DIAGNOSIS — I209 Angina pectoris, unspecified: Secondary | ICD-10-CM | POA: Diagnosis not present

## 2013-12-25 DIAGNOSIS — G4733 Obstructive sleep apnea (adult) (pediatric): Secondary | ICD-10-CM | POA: Diagnosis not present

## 2013-12-25 DIAGNOSIS — J449 Chronic obstructive pulmonary disease, unspecified: Secondary | ICD-10-CM

## 2013-12-25 DIAGNOSIS — G4734 Idiopathic sleep related nonobstructive alveolar hypoventilation: Secondary | ICD-10-CM | POA: Insufficient documentation

## 2013-12-25 NOTE — Assessment & Plan Note (Addendum)
Reviewed his PFT's.  Explained that he has an asthmatic component to his COPD.  Explained that he could benefit from use of ICS +/- LABA.  He is reluctant to try changing his inhaler regimen at present.  Will continue prn combivent for now.  I completed handicap parking form for him.

## 2013-12-25 NOTE — Assessment & Plan Note (Signed)
Related to COPD.  Will arrange for 2 liters oxygen to be used at night with CPAP.  Will repeat ONO with CPAP and 2 liters oxygen.

## 2013-12-25 NOTE — Progress Notes (Signed)
PFT done today. 

## 2013-12-25 NOTE — Assessment & Plan Note (Signed)
His recent CPAP download shows good control of apnea with current set up.  Will arrange for CPAP mask refit.

## 2013-12-25 NOTE — Patient Instructions (Signed)
Will arrange for home oxygen set up >> use 2 liters oxygen with CPAP at night Will arrange for overnight oxygen test Will arrange for CPAP mask refit Follow up in 4 months

## 2013-12-25 NOTE — Progress Notes (Signed)
Chief Complaint  Patient presents with  . Follow-up    Review PFT. No change in breathing since last OV.     History of Present Illness: Curtis Riley is a 79 y.o. male COPD/emphysema, and OSA.  He is here to review his PFT.  This shows moderate to severe obstruction with positive bronchodilator response.  He did not use Breo.  He was concerned about side effects.  He uses combivent 3 or 4 times per day.  This helps.  He gets more winded in hot weather.   TESTS: PSG 04/07/06 >> AHI 19, SaO2 low 84%  CPAP 05/19/06 >> CPAP 12 cm H2O ONO with CPAP and room air 11/22/13 >> test time 8 hrs 2 min. Mean SpO2 90.1%, low SpO2 84%. Spent 1 hr 15 min with SpO2 < 88%. CPAP 11/07/13 to 12/06/13 >> Used on 27 of 30 nights with average 6 hrs 50 min.  Average AHI 0.8 with median CPAP 8 cm H2O and 95 th percentile CPAP 12 cm H2O. PFT 12/25/13 >> FEV1 1.47 (47%), FEV1% 51, TLC 7.90 (103%), DLCO 65%, +BD   Curtis Riley  has a past medical history of BPH (benign prostatic hyperplasia); Smoker; CVA (cerebral infarction); Hypertension; COPD (chronic obstructive pulmonary disease); Diverticulosis; Angina; Shortness of breath; Sleep apnea; Kidney stones; Neuromuscular disorder; Arthritis; Dyslipidemia; Family history of coronary artery disease; and History of nuclear stress test (03/2010).  Curtis Riley  has past surgical history that includes Transurethral resection of prostate (08/1996); Transurethral resection of prostate; Cardiac catheterization (10/2010); Tonsillectomy (age 50); Anterior cervical decomp/discectomy fusion (10/06/2011); and transthoracic echocardiogram (2004).  Prior to Admission medications   Medication Sig Start Date End Date Taking? Authorizing Provider  aspirin 81 MG tablet Take 81 mg by mouth daily.    Historical Provider, MD  carvedilol (COREG) 25 MG tablet Take 1 tablet (25 mg total) by mouth 2 (two) times daily. 09/28/13   Pixie Casino, MD  Fluticasone-Salmeterol (ADVAIR  DISKUS) 250-50 MCG/DOSE AEPB Inhale 1 puff into the lungs 2 (two) times daily. 11/20/13   Chesley Mires, MD  furosemide (LASIX) 40 MG tablet TAKE 1 TABLET EVERY OTHER DAY 09/26/13   Pixie Casino, MD  Ipratropium-Albuterol (COMBIVENT RESPIMAT) 20-100 MCG/ACT AERS respimat INHALE 1 PUFF INTO THE LUNGS EVERY 6 HOURS 10/04/13   Denita Lung, MD  lisinopril (PRINIVIL,ZESTRIL) 40 MG tablet Take 1 tablet (40 mg total) by mouth daily. 09/26/13   Pixie Casino, MD  nitroGLYCERIN (NITROSTAT) 0.4 MG SL tablet Place 1 tablet (0.4 mg total) under the tongue every 5 (five) minutes as needed for chest pain. 03/29/13   Pixie Casino, MD  NON FORMULARY at bedtime. CPAP    Historical Provider, MD  potassium chloride (K-DUR) 10 MEQ tablet Take 1 tablet (10 mEq total) by mouth every other day. 09/26/13   Pixie Casino, MD    Allergies  Allergen Reactions  . Penicillins Anaphylaxis     Physical Exam:  General - No distress ENT - No sinus tenderness, no oral exudate, no LAN Cardiac - s1s2 regular, no murmur Chest - No wheeze/rales/dullness Back - No focal tenderness Abd - Soft, non-tender Ext - No edema Neuro - Normal strength Skin - No rashes Psych - normal mood, and behavior   Assessment/Plan:  Chesley Mires, MD [place] [facility]/Critical Care/Sleep Pager:  [phone]

## 2013-12-26 ENCOUNTER — Telehealth: Payer: Self-pay | Admitting: Pulmonary Disease

## 2013-12-26 DIAGNOSIS — G4733 Obstructive sleep apnea (adult) (pediatric): Secondary | ICD-10-CM

## 2013-12-26 NOTE — Telephone Encounter (Signed)
Pt order for O51 is over 41 days old from last ONO. Pt will need to repeat ONO per medicare guidelines. Please advise Dr. Halford Chessman thanks

## 2013-12-26 NOTE — Telephone Encounter (Signed)
ATC NA, WCB- need to inform the pt before ordering

## 2013-12-26 NOTE — Telephone Encounter (Signed)
Please order repeat ONO with CPAP and room air.  Please inform pt this is being done to satisfy medicare criteria only to allow for home oxygen set up.

## 2013-12-27 NOTE — Telephone Encounter (Signed)
[id number] to advise the pt that repeat ONO will be ordered. Imlay Bing, CMA

## 2013-12-29 NOTE — Telephone Encounter (Signed)
Pt aware will need to repeat ONO. Order placed. Nothing further needed

## 2014-01-04 ENCOUNTER — Telehealth: Payer: Self-pay | Admitting: Pulmonary Disease

## 2014-01-04 NOTE — Telephone Encounter (Signed)
lmomtcb x1 for pt 

## 2014-01-04 NOTE — Telephone Encounter (Signed)
Called spoke with pt. He reports he spoke with [facility] and was told they never received his ONO order. I can't tell if order has been faxed or where it was sent. Please advise PCC;s thanks

## 2014-01-05 ENCOUNTER — Telehealth: Payer: Self-pay | Admitting: Internal Medicine

## 2014-01-05 NOTE — Telephone Encounter (Signed)
RN returned call to patient. Advised that carvedilol 25mg  BID was refilled #[id number] with 3 refills on 09/28/13. Advised patient to call pharmacy to check on this and communicate this date/information with them. Patient will call back if any further issues.

## 2014-01-05 NOTE — Telephone Encounter (Signed)
refaxed to [facility] Curtis Riley

## 2014-01-05 NOTE — Telephone Encounter (Signed)
Mail order pharmacy [facility] said his last order for generic coreg was 12.5 mg.  Was changed to 25 mg.  Needs to be 25mg  twice daily  Please call.  Thinks someone submitted old dosage.

## 2014-01-15 DIAGNOSIS — G4733 Obstructive sleep apnea (adult) (pediatric): Secondary | ICD-10-CM | POA: Diagnosis not present

## 2014-01-17 ENCOUNTER — Telehealth: Payer: Self-pay | Admitting: Pulmonary Disease

## 2014-01-17 DIAGNOSIS — G4734 Idiopathic sleep related nonobstructive alveolar hypoventilation: Secondary | ICD-10-CM

## 2014-01-17 NOTE — Telephone Encounter (Signed)
ONO with RA 01/15/14 >> Test time 5 hrs 57 min.  Mean SpO2 92%, low SpO2 85%.  Spent 13 min with SpO2 < 88%.  He needs ONO while using CPAP.  Will have Riesel f/u with his DME about doing ONO with CPAP.

## 2014-01-18 NOTE — Telephone Encounter (Signed)
i need an order for ono on cpap per dr Halford Chessman Joellen [place]

## 2014-01-18 NOTE — Telephone Encounter (Signed)
Order placed for ONO on CPAP LMOM TCB x1 for pt to inform him of the pending test

## 2014-01-22 NOTE — Telephone Encounter (Signed)
Pt states that he had an ONO with CPAP a few days ago States that he wore the CPAP for one part of the test and then slept without it for the other half. States that he was told that this was going to be faxed to Dr Halford Chessman for review.   LM with [facility] to return call.

## 2014-01-25 ENCOUNTER — Telehealth: Payer: Self-pay | Admitting: Pulmonary Disease

## 2014-01-25 NOTE — Telephone Encounter (Signed)
See phone note 01/25/14

## 2014-01-25 NOTE — Telephone Encounter (Signed)
Spoke with pt. He reports he did have ONO done RA 1 night and then ONO on CPAP 1 night. He reports we should have 2 sets of data.  According to phone note 01/17/14: ONO with RA 01/15/14 >> Test time 5 hrs 57 min. Mean SpO2 92%, low SpO2 85%. Spent 13 min with SpO2 < 88%.  He needs ONO while using CPAP. Will have Bulger f/u with his DME about doing ONO with CPAP -- I called Ivin Booty w/ [phone] LMTCB x1 to clarify if the recent ONO was on RA or with CPAP? If he did do ONO 2 nights in a row? Pt aware we are working on this

## 2014-01-25 NOTE — Telephone Encounter (Signed)
Spoke with Ivin Booty from [facility]. She reports the ONO we received from 01/15/14 was on CPAP for part of the test and other half w/o it. Please advise Dr. Halford Chessman thanks

## 2014-01-29 NOTE — Telephone Encounter (Signed)
VS pt. Will forward to ashtyn to f/u on.

## 2014-01-29 NOTE — Telephone Encounter (Signed)
I spoke with Curtis Riley and she is faxing the report for ONO on cpap. Thatcher Bing, CMA

## 2014-01-30 NOTE — Telephone Encounter (Signed)
Spoke with [facility] ONO on CPAP to be faxed to triage as we have not received this yet. Will await fax

## 2014-01-30 NOTE — Telephone Encounter (Signed)
ONO on CPAP received and placed in Dr Halford Chessman look at to advise on upon hie return to office tomorrow (01/31/14)

## 2014-02-02 NOTE — Telephone Encounter (Signed)
Curtis Riley ([facility]) [phone]  They still have not gotten results. They need this before the 30 day mark. This is a repeat from the first time because we did not do it within 30days the last time.

## 2014-02-02 NOTE — Telephone Encounter (Signed)
Will forward back to VS so he will be aware to review the results asap

## 2014-02-04 NOTE — Telephone Encounter (Addendum)
ONO with CPAP and RA 01/15/14 >> Test time 5 hrs 58 min.  Mean SpO2 92%, low SpO2 85%.  Spent 3 min 20 sec with SpO2 < 88%.  Please inform pt that ONO shows good control of oxygen with CPAP alone.  He does not need to use oxygen at night.  He is to continue using CPAP when he is asleep.

## 2014-02-05 NOTE — Telephone Encounter (Signed)
Called, spoke with pt.  Informed him of below results and recs per Dr. Halford Chessman.  Pt is concerned about the results and recs.  Pt states the 01/15/14 ONO with cpap on RA was only done because the prior results didn't get to Medicare within the 30 day window.  Pt states he was advised he would need o2 set up with CPAP according to test results done prior to his OV with VS on 12/25/13 but now is being advised that he doesn't need the o2..  Per 12/25/13 instructions:   Patient Instructions     Will arrange for home oxygen set up >> use 2 liters oxygen with CPAP at night  Will arrange for overnight oxygen test  Will arrange for CPAP mask refit  Follow up in 4 months    -----  Dr. Halford Chessman, pt is asking for clarification on whether he really does or doesn't need o2.    He is requesting this to be taken care of ASAP, and would like response today if possible, as the 30 day window for Medicare is approaching again and doesn't want to have to retest if o2 is needed.  Please advise.  Thank you.  ** Pt requesting we leave a detailed msg on the # provided above if he doesn't answer.

## 2014-02-05 NOTE — Telephone Encounter (Signed)
lmomtcb for pt to inform him of below.  Spoke with Lauren with [facility].  Informed her of below results and recs per Dr. Halford Chessman. She verbalized understanding and will inform Ivin Booty.  Lauren is requesting this phone msg to be faxed to her at [phone].  This has been done.  Lauren aware and voiced no further questions or concerns at this time.

## 2014-02-05 NOTE — Telephone Encounter (Signed)
Pt returned call.  Curtis Riley ° °

## 2014-02-05 NOTE — Telephone Encounter (Signed)
LMOM x 1 for to return call

## 2014-02-05 NOTE — Telephone Encounter (Signed)
Inform pt that based on most recent results he does not qualify for home oxygen at night.  Only option is to repeat ONO with CPAP again.

## 2014-02-06 ENCOUNTER — Telehealth: Payer: Self-pay | Admitting: Family Medicine

## 2014-02-06 NOTE — Telephone Encounter (Signed)
Pt states he went for PFT & was told needed Oxygen 2 liters qhs, by the time they got the order in was past 30 day window and Medicare wouldn't accept it.  Then had PFT a second time and was called back & told didn't need the Oxygen.  He is upset with them and why this took so long for the original order to get it and now they are saying he doesn't need the Oxygen.  He wants to be on Oxygen at night.  They told him he could take the test again, he is concerned over whether or not Medicare will pay for a 3rd test and really doesn't want to go back to same Pulmanologist.  Pt wanted you to review both PFT tests and call him and let him know your recommendations.  He really feels he needs Oxygen at night.

## 2014-02-06 NOTE — Telephone Encounter (Signed)
Lmtcbx2. Lautaro Koral, CMA  

## 2014-02-06 NOTE — Telephone Encounter (Signed)
Pt advised and he does not wish to proceed at this time. Curtis Riley, CMA

## 2014-02-07 ENCOUNTER — Encounter: Payer: Self-pay | Admitting: Pulmonary Disease

## 2014-02-08 NOTE — Telephone Encounter (Signed)
Tell him to set up CPAP based on the last results and we can get a readout in one month and see what it shows

## 2014-02-12 NOTE — Telephone Encounter (Signed)
I HAVE CALLED PT TO LET HIM KNOW WHAT DR.LALONDE WANTED HIM TO DO Tell him to set up CPAP based on the last results and we can get a readout in one month and see what it shows LEFT THIS MESSAGE ON PT VOICEMAIL

## 2014-02-14 LAB — PULMONARY FUNCTION TEST
DL/VA % PRED: 88 %
DL/VA: 4.21 ml/min/mmHg/L
DLCO UNC % PRED: 65 %
DLCO UNC: 23.76 ml/min/mmHg
FEF 25-75 PRE: 0.45 L/s
FEF 25-75 Post: 0.79 L/sec
FEF2575-%Change-Post: 77 %
FEF2575-%PRED-PRE: 17 %
FEF2575-%Pred-Post: 31 %
FEV1-%Change-Post: 24 %
FEV1-%PRED-PRE: 38 %
FEV1-%Pred-Post: 47 %
FEV1-Post: 1.47 L
FEV1-Pre: 1.19 L
FEV1FVC-%Change-Post: 9 %
FEV1FVC-%Pred-Pre: 62 %
FEV6-%Change-Post: 19 %
FEV6-%PRED-POST: 70 %
FEV6-%Pred-Pre: 59 %
FEV6-POST: 2.79 L
FEV6-PRE: 2.34 L
FEV6FVC-%CHANGE-POST: 5 %
FEV6FVC-%Pred-Post: 101 %
FEV6FVC-%Pred-Pre: 96 %
FVC-%Change-Post: 13 %
FVC-%PRED-PRE: 61 %
FVC-%Pred-Post: 69 %
FVC-POST: 2.89 L
FVC-Pre: 2.55 L
Post FEV1/FVC ratio: 51 %
Post FEV6/FVC ratio: 97 %
Pre FEV1/FVC ratio: 47 %
Pre FEV6/FVC Ratio: 92 %
RV % pred: 185 %
RV: 5.12 L
TLC % PRED: 103 %
TLC: 7.9 L

## 2014-02-16 ENCOUNTER — Other Ambulatory Visit: Payer: Self-pay | Admitting: Internal Medicine

## 2014-02-16 NOTE — Telephone Encounter (Signed)
Refilled #180 tablets with 3 refills on 09/28/2013

## 2014-02-19 ENCOUNTER — Encounter: Payer: Self-pay | Admitting: Family Medicine

## 2014-02-26 ENCOUNTER — Telehealth: Payer: Self-pay | Admitting: *Deleted

## 2014-02-26 MED ORDER — CARVEDILOL 25 MG PO TABS: 25.0000 mg | ORAL_TABLET | Freq: Two times a day (BID) | ORAL | Status: DC

## 2014-02-26 NOTE — Telephone Encounter (Signed)
Patient came in stating he ran out of Brielle.  Did not receive from mail order.  New Rx sent to [facility] and #30 called to [facility] on [address].

## 2014-03-08 ENCOUNTER — Telehealth: Payer: Self-pay | Admitting: Family Medicine

## 2014-03-08 NOTE — Telephone Encounter (Signed)
Pt was informed he has to take the CD card to where he got the CPAP machine & they will download and have them send [place] the results

## 2014-03-21 ENCOUNTER — Encounter: Payer: Self-pay | Admitting: Family Medicine

## 2014-03-21 ENCOUNTER — Ambulatory Visit (INDEPENDENT_AMBULATORY_CARE_PROVIDER_SITE_OTHER): Payer: Medicare Other | Admitting: Family Medicine

## 2014-03-21 ENCOUNTER — Telehealth: Payer: Self-pay | Admitting: Internal Medicine

## 2014-03-21 DIAGNOSIS — J9610 Chronic respiratory failure, unspecified whether with hypoxia or hypercapnia: Secondary | ICD-10-CM | POA: Diagnosis not present

## 2014-03-21 DIAGNOSIS — R0902 Hypoxemia: Secondary | ICD-10-CM

## 2014-03-21 DIAGNOSIS — G4733 Obstructive sleep apnea (adult) (pediatric): Secondary | ICD-10-CM | POA: Diagnosis not present

## 2014-03-21 DIAGNOSIS — I209 Angina pectoris, unspecified: Secondary | ICD-10-CM

## 2014-03-21 DIAGNOSIS — J449 Chronic obstructive pulmonary disease, unspecified: Secondary | ICD-10-CM

## 2014-03-21 DIAGNOSIS — J9611 Chronic respiratory failure with hypoxia: Secondary | ICD-10-CM

## 2014-03-21 NOTE — Progress Notes (Signed)
   Subjective:    Patient ID: Curtis Riley, male    DOB: March 13, 1935, 79 y.o.   MRN: [id number]  HPI He is here for consultation. He does have underlying OSA as well as COPD. He has been seen by pulmonary in the last several months and evaluated for sleep related hypoxia. Apparently one test showed this in another one didn't. He is very frustrated over time to get this resolved and says that he left multiple messages and did not get return phone calls concerning this.   Review of Systems     Objective:   Physical Exam Alert and in no distress. Resting pulse ox was 97. With very limited physical activity his pulse ox dropped to 81.       Assessment & Plan:  Chronic obstructive pulmonary disease, unspecified COPD, unspecified chronic bronchitis type  OSA (obstructive sleep apnea)  COPD with asthma  Chronic respiratory failure with hypoxia  I will range for home oxygen. We'll readdress the nighttime hypoxia at a later date. Discussed pulmonary referral and at this time will defer it to a later date. He is comfortable with this.

## 2014-03-21 NOTE — Telephone Encounter (Signed)
Dr. Redmond School wanted me to call [facility] to get a portable o2 and in home oxygen for his COPD since his O2 dropped to 81% walking around the office one time. I called [facility] and spoke to Ivin Booty and due to his Insurance being medicare, and tricare, the order had to go to [facility]. She told me to Write on the script O2 Concentrator with portable system, # of liter Flow (which Dr. Redmond School said 2 Liters), and Dx Code- 327. I have printed demo,insurance and office visit and script and faxed everything over to [facility] @ [phone].

## 2014-03-23 LAB — PULMONARY FUNCTION TEST

## 2014-03-26 DIAGNOSIS — R0602 Shortness of breath: Secondary | ICD-10-CM | POA: Diagnosis not present

## 2014-03-29 ENCOUNTER — Ambulatory Visit (INDEPENDENT_AMBULATORY_CARE_PROVIDER_SITE_OTHER): Payer: Medicare Other | Admitting: Family Medicine

## 2014-03-29 ENCOUNTER — Encounter: Payer: Self-pay | Admitting: Internal Medicine

## 2014-03-29 ENCOUNTER — Telehealth: Payer: Self-pay

## 2014-03-29 ENCOUNTER — Ambulatory Visit (INDEPENDENT_AMBULATORY_CARE_PROVIDER_SITE_OTHER): Payer: Medicare Other | Admitting: Internal Medicine

## 2014-03-29 VITALS — BP 134/90 | HR 68 | Ht 76.0 in | Wt 258.3 lb

## 2014-03-29 DIAGNOSIS — J449 Chronic obstructive pulmonary disease, unspecified: Secondary | ICD-10-CM | POA: Diagnosis not present

## 2014-03-29 DIAGNOSIS — E785 Hyperlipidemia, unspecified: Secondary | ICD-10-CM | POA: Diagnosis not present

## 2014-03-29 DIAGNOSIS — R0902 Hypoxemia: Secondary | ICD-10-CM

## 2014-03-29 DIAGNOSIS — G4734 Idiopathic sleep related nonobstructive alveolar hypoventilation: Secondary | ICD-10-CM

## 2014-03-29 DIAGNOSIS — J9611 Chronic respiratory failure with hypoxia: Secondary | ICD-10-CM | POA: Insufficient documentation

## 2014-03-29 DIAGNOSIS — I10 Essential (primary) hypertension: Secondary | ICD-10-CM | POA: Diagnosis not present

## 2014-03-29 DIAGNOSIS — I201 Angina pectoris with documented spasm: Secondary | ICD-10-CM

## 2014-03-29 DIAGNOSIS — J9610 Chronic respiratory failure, unspecified whether with hypoxia or hypercapnia: Secondary | ICD-10-CM | POA: Diagnosis not present

## 2014-03-29 DIAGNOSIS — G4733 Obstructive sleep apnea (adult) (pediatric): Secondary | ICD-10-CM | POA: Diagnosis not present

## 2014-03-29 DIAGNOSIS — I209 Angina pectoris, unspecified: Secondary | ICD-10-CM

## 2014-03-29 NOTE — Patient Instructions (Signed)
Your physician recommends that you return for lab work at your convenience. You will need to be fasting.   Your physician wants you to follow-up in: 6 months with Dr. Debara Pickett. You will receive a reminder letter in the mail two months in advance. If you don't receive a letter, please call our office to schedule the follow-up appointment.

## 2014-03-29 NOTE — Telephone Encounter (Signed)
Left message to please call and set up an appointment for the face to face so he can get the machine he needs from [facility]

## 2014-03-29 NOTE — Progress Notes (Signed)
OFFICE NOTE  Chief Complaint:  Routine followup  Primary Care Physician: Wyatt Haste, MD  HPI:  Curtis Riley is a 79 year old gentleman with a history of cervical neuropathy and recent C3-C4 surgery. He also has dyslipidemia, hypertension, sleep apnea, on CPAP, and mild COPD. He underwent catheterization, as you know, in 2012 for chest pain. It was negative except for a small bridging segment in the LAD. When I last saw him he had 2 episodes of chest discomfort and I prescribed some nitroglycerin. He reports that he has not needed to take that in the past 6 months. He has had some increase in mucus and secretions related to his COPD and shortness of breath. He feels that that could be better controlled and is planning to see his primary care provider about that.   This [name] returns today for followup. He is generally doing well. He denies any chest pain. He has had progressive shortness of breath and recently has been approved for oxygen. He plans to wear that 24/7. He does struggle with some lower extremity edema and wears compression stockings.  PMHx:  Past Medical History  Diagnosis Date  . BPH (benign prostatic hyperplasia)   . Smoker   . CVA (cerebral infarction)   . Hypertension   . COPD (chronic obstructive pulmonary disease)   . Diverticulosis   . Angina     occasional chest pain  never been treated for this ...   . Shortness of breath     with exertion   . Sleep apnea     uses cpap  . Kidney stones     10-15 yrs ago  . Neuromuscular disorder   . Arthritis   . Dyslipidemia   . Family history of coronary artery disease     brothers, sisters  . History of nuclear stress test 03/2010    dobutamine; mild ischemia in mid inferior and apical inferior regions; ekg negative for ischemia; low risk scan     Past Surgical History  Procedure Laterality Date  . Transurethral resection of prostate  08/1996  . Transurethral resection of prostate      long  time ago per pt.  10-15 yrs  . Cardiac catheterization  10/2010    small bridging segment in LAD  . Tonsillectomy  age 46  . Anterior cervical decomp/discectomy fusion  10/06/2011    Procedure: ANTERIOR CERVICAL DECOMPRESSION/DISCECTOMY FUSION 3 LEVELS;  Surgeon: Faythe Ghee, MD;  Location: [facility];  Service: Neurosurgery;  Laterality: Bilateral;  Cervical three-four, four-five, five-six anterior cervical decompression, fusion  . Transthoracic echocardiogram  2004    normal LV size and function, aortic root dilated    FAMHx:  Family History  Problem Relation Age of Onset  . Arthritis Mother   . Hypertension Mother   . Arthritis Father   . Hypertension Father   . Arthritis Sister   . Diabetes Sister   . Hypertension Sister   . Arthritis Brother   . Hypertension Brother     SOCHx:   reports that he quit smoking about 15 years ago. His smoking use included Cigarettes. He has a 20 pack-year smoking history. He has never used smokeless tobacco. He reports that he does not drink alcohol or use illicit drugs.  ALLERGIES:  Allergies  Allergen Reactions  . Penicillins Anaphylaxis    ROS: A comprehensive review of systems was negative except for: Respiratory: positive for dyspnea on exertion  HOME MEDS: Current Outpatient Prescriptions  Medication Sig  Dispense Refill  . aspirin 81 MG tablet Take 81 mg by mouth daily.      . carvedilol (COREG) 25 MG tablet Take 1 tablet (25 mg total) by mouth 2 (two) times daily.  180 tablet  3  . furosemide (LASIX) 40 MG tablet TAKE 1 TABLET EVERY OTHER DAY  45 tablet  3  . Ipratropium-Albuterol (COMBIVENT RESPIMAT) 20-100 MCG/ACT AERS respimat INHALE 1 PUFF INTO THE LUNGS EVERY 6 HOURS  3 Inhaler  3  . lisinopril (PRINIVIL,ZESTRIL) 40 MG tablet Take 1 tablet (40 mg total) by mouth daily.  90 tablet  3  . nitroGLYCERIN (NITROSTAT) 0.4 MG SL tablet Place 1 tablet (0.4 mg total) under the tongue every 5 (five) minutes as needed for chest pain.   25 tablet  4  . NON FORMULARY at bedtime. CPAP      . potassium chloride (K-DUR) 10 MEQ tablet Take 1 tablet (10 mEq total) by mouth every other day.  45 tablet  3   No current facility-administered medications for this visit.    LABS/IMAGING: No results found for this or any previous visit (from the past 48 hour(s)). No results found.  VITALS: BP 134/90  Pulse 68  Ht 6\' 4"  (1.93 m)  Wt 258 lb 4.8 oz (117.164 kg)  BMI 31.45 kg/m2  EXAM: General appearance: alert and no distress Neck: no adenopathy, no carotid bruit, no JVD, supple, symmetrical, trachea midline and thyroid not enlarged, symmetric, no tenderness/mass/nodules Lungs: clear to auscultation bilaterally Heart: regular rate and rhythm, S1, S2 normal, no murmur, click, rub or gallop Abdomen: soft, non-tender; bowel sounds normal; no masses,  no organomegaly Extremities: extremities normal, atraumatic, no cyanosis or edema Pulses: 2+ and symmetric Skin: Skin color, texture, turgor normal. No rashes or lesions Neurologic: Grossly normal  EKG: Normal sinus rhythm at 68, PAC's  ASSESSMENT: 1. Hypertension-controlled 2. Dyslipidemia-taken off statins (?why) 3. COPD - now on oxygen 4. Intermittent coronary spasm  PLAN: 1.   Mr. Gildner is doing fairly well at this time. His blood pressure is well controlled and he has very infrequent episodes of chest discomfort mostly at rest. He is now going to be started on oxygen for shortness of breath. It would be reasonable to consider lipid profile given his history of stroke in the past and dyslipidemia. Good cholesterol control is important in him. We will contact him with the results of the tests and plan to see him back in 6 months.  Pixie Casino, MD, [facility] Attending Cardiologist [facility] 03/29/2014, 5:58 PM

## 2014-03-29 NOTE — Progress Notes (Signed)
   Subjective:    Patient ID: Curtis Riley, male    DOB: Oct 26, 1934, 79 y.o.   MRN: [id number]  HPI He is here for consult concerning the recent oximetry report that was done at his house. He also had pulmonary function testing done. All these tests do indicate COPD with chronic respiratory failure. In fact his pulse ox dropped down to 82 with minimal physical activity. He will require noninvasive ventilation and O2 therapy continuously   Review of Systems     Objective:   Physical Exam Alert and in no distress otherwise not examined      Assessment & Plan:  Chronic obstructive pulmonary disease, unspecified COPD, unspecified chronic bronchitis type  Chronic respiratory failure with hypoxia  appropriate paperwork was filled out for him to get oxygen therapy.

## 2014-04-02 ENCOUNTER — Encounter: Payer: Self-pay | Admitting: Family Medicine

## 2014-04-02 DIAGNOSIS — E785 Hyperlipidemia, unspecified: Secondary | ICD-10-CM | POA: Diagnosis not present

## 2014-04-02 LAB — COMPREHENSIVE METABOLIC PANEL
ALBUMIN: 3.6 g/dL (ref 3.5–5.2)
ALT: 10 U/L (ref 0–53)
AST: 16 U/L (ref 0–37)
Alkaline Phosphatase: 71 U/L (ref 39–117)
BUN: 12 mg/dL (ref 6–23)
CO2: 25 meq/L (ref 19–32)
Calcium: 9.3 mg/dL (ref 8.4–10.5)
Chloride: 104 mEq/L (ref 96–112)
Creat: 1.26 mg/dL (ref 0.50–1.35)
GLUCOSE: 82 mg/dL (ref 70–99)
POTASSIUM: 4.9 meq/L (ref 3.5–5.3)
Sodium: 140 mEq/L (ref 135–145)
Total Bilirubin: 0.9 mg/dL (ref 0.2–1.2)
Total Protein: 6.6 g/dL (ref 6.0–8.3)

## 2014-04-02 LAB — LIPID PANEL
Cholesterol: 160 mg/dL (ref 0–200)
HDL: 51 mg/dL (ref >39)
LDL CALC: 96 mg/dL (ref 0–99)
Total CHOL/HDL Ratio: 3.1 Ratio
Triglycerides: 65 mg/dL (ref <150)
VLDL: 13 mg/dL (ref 0–40)

## 2014-04-04 ENCOUNTER — Encounter: Payer: Self-pay | Admitting: *Deleted

## 2014-09-04 ENCOUNTER — Other Ambulatory Visit (INDEPENDENT_AMBULATORY_CARE_PROVIDER_SITE_OTHER): Payer: Medicare Other

## 2014-09-04 DIAGNOSIS — Z23 Encounter for immunization: Secondary | ICD-10-CM | POA: Diagnosis not present

## 2014-09-25 ENCOUNTER — Ambulatory Visit (INDEPENDENT_AMBULATORY_CARE_PROVIDER_SITE_OTHER): Payer: Medicare Other | Admitting: Internal Medicine

## 2014-09-25 ENCOUNTER — Encounter: Payer: Self-pay | Admitting: Internal Medicine

## 2014-09-25 VITALS — BP 134/86 | HR 73 | Ht 76.0 in | Wt 259.0 lb

## 2014-09-25 DIAGNOSIS — G4733 Obstructive sleep apnea (adult) (pediatric): Secondary | ICD-10-CM

## 2014-09-25 DIAGNOSIS — J449 Chronic obstructive pulmonary disease, unspecified: Secondary | ICD-10-CM | POA: Diagnosis not present

## 2014-09-25 DIAGNOSIS — J9611 Chronic respiratory failure with hypoxia: Secondary | ICD-10-CM

## 2014-09-25 DIAGNOSIS — R0602 Shortness of breath: Secondary | ICD-10-CM

## 2014-09-25 DIAGNOSIS — Z79899 Other long term (current) drug therapy: Secondary | ICD-10-CM | POA: Diagnosis not present

## 2014-09-25 DIAGNOSIS — E785 Hyperlipidemia, unspecified: Secondary | ICD-10-CM

## 2014-09-25 DIAGNOSIS — R600 Localized edema: Secondary | ICD-10-CM

## 2014-09-25 DIAGNOSIS — M7989 Other specified soft tissue disorders: Secondary | ICD-10-CM | POA: Diagnosis not present

## 2014-09-25 DIAGNOSIS — R0609 Other forms of dyspnea: Secondary | ICD-10-CM | POA: Diagnosis not present

## 2014-09-25 LAB — BASIC METABOLIC PANEL
BUN: 14 mg/dL (ref 6–23)
CO2: 32 meq/L (ref 19–32)
Calcium: 9.4 mg/dL (ref 8.4–10.5)
Chloride: 107 mEq/L (ref 96–112)
Creat: 1.22 mg/dL (ref 0.50–1.35)
Glucose, Bld: 76 mg/dL (ref 70–99)
Potassium: 4.6 mEq/L (ref 3.5–5.3)
Sodium: 143 mEq/L (ref 135–145)

## 2014-09-25 MED ORDER — FUROSEMIDE 40 MG PO TABS: 40.0000 mg | ORAL_TABLET | Freq: Every day | ORAL | Status: DC

## 2014-09-25 MED ORDER — LISINOPRIL 40 MG PO TABS: 40.0000 mg | ORAL_TABLET | Freq: Every day | ORAL | Status: DC

## 2014-09-25 MED ORDER — POTASSIUM CHLORIDE ER 10 MEQ PO TBCR
10.0000 meq | EXTENDED_RELEASE_TABLET | Freq: Every day | ORAL | Status: DC
Start: 2014-09-25 — End: 2015-09-19

## 2014-09-25 NOTE — Patient Instructions (Signed)
Dr Debara Pickett has ordered the following test(s) to be done: 1. Blood work - to be done today 2. 2D Echocardiogram - Echocardiography is a painless test that uses sound waves to create images of your heart. It provides your doctor with information about the size and shape of your heart and how well your heart's chambers and valves are working. This procedure takes approximately one hour. There are no restrictions for this procedure.  Your physician has recommended making the following medication changes: TAKE Furosemide 40 mg DAILY TAKE Potassium 10 mEq DAILY  Dr Debara Pickett wants you to follow-up in 3 weeks. You will receive a reminder letter in the mail one months in advance. If you don't receive a letter, please call our office to schedule the follow-up appointment.

## 2014-09-25 NOTE — Progress Notes (Signed)
OFFICE NOTE  Chief Complaint:  Shortness of breath, leg swelling  Primary Care Physician: Wyatt Haste, MD  HPI:  Curtis Riley is a 80 year old gentleman with a history of cervical neuropathy and recent C3-C4 surgery. He also has dyslipidemia, hypertension, sleep apnea, on CPAP, and mild COPD. He underwent catheterization, as you know, in 2012 for chest pain. It was negative except for a small bridging segment in the LAD. When I last saw him he had 2 episodes of chest discomfort and I prescribed some nitroglycerin. He reports that he has not needed to take that in the past 6 months. He has had some increase in mucus and secretions related to his COPD and shortness of breath. He feels that that could be better controlled and is planning to see his primary care provider about that.   [name] returns today for followup. He is generally doing well. He denies any chest pain. He has had progressive shortness of breath and recently has been approved for oxygen. He plans to wear that 24/7. He does struggle with some lower extremity edema and wears compression stockings.  I saw Curtis Riley back in the office today. He reports that he's had some progressive shortness of breath again, despite using oxygen and CPAP. He's had some worsening leg swelling, more than typical in the past. He wears compression stockings, however has also noted some weight gain and mild orthopnea. I'm concerned that there may be some overlying right heart failure.  PMHx:  Past Medical History  Diagnosis Date  . BPH (benign prostatic hyperplasia)   . Smoker   . CVA (cerebral infarction)   . Hypertension   . COPD (chronic obstructive pulmonary disease)   . Diverticulosis   . Angina     occasional chest pain  never been treated for this ...   . Shortness of breath     with exertion   . Sleep apnea     uses cpap  . Kidney stones     10-15 yrs ago  . Neuromuscular disorder   . Arthritis   .  Dyslipidemia   . Family history of coronary artery disease     brothers, sisters  . History of nuclear stress test 03/2010    dobutamine; mild ischemia in mid inferior and apical inferior regions; ekg negative for ischemia; low risk scan     Past Surgical History  Procedure Laterality Date  . Transurethral resection of prostate  08/1996  . Transurethral resection of prostate      long time ago per pt.  10-15 yrs  . Cardiac catheterization  10/2010    small bridging segment in LAD  . Tonsillectomy  age 23  . Anterior cervical decomp/discectomy fusion  10/06/2011    Procedure: ANTERIOR CERVICAL DECOMPRESSION/DISCECTOMY FUSION 3 LEVELS;  Surgeon: Faythe Ghee, MD;  Location: [facility];  Service: Neurosurgery;  Laterality: Bilateral;  Cervical three-four, four-five, five-six anterior cervical decompression, fusion  . Transthoracic echocardiogram  2004    normal LV size and function, aortic root dilated    FAMHx:  Family History  Problem Relation Age of Onset  . Arthritis Mother   . Hypertension Mother   . Arthritis Father   . Hypertension Father   . Arthritis Sister   . Diabetes Sister   . Hypertension Sister   . Arthritis Brother   . Hypertension Brother     SOCHx:   reports that he quit smoking about 16 years ago. His smoking use  included Cigarettes. He has a 20 pack-year smoking history. He has never used smokeless tobacco. He reports that he does not drink alcohol or use illicit drugs.  ALLERGIES:  Allergies  Allergen Reactions  . Penicillins Anaphylaxis    ROS: A comprehensive review of systems was negative except for: Respiratory: positive for dyspnea on exertion Cardiovascular: positive for lower extremity edema  HOME MEDS: Current Outpatient Prescriptions  Medication Sig Dispense Refill  . aspirin 81 MG tablet Take 81 mg by mouth daily.    . carvedilol (COREG) 25 MG tablet Take 1 tablet (25 mg total) by mouth 2 (two) times daily. 180 tablet 3  . furosemide  (LASIX) 40 MG tablet Take 1 tablet (40 mg total) by mouth daily. 90 tablet 3  . Ipratropium-Albuterol (COMBIVENT RESPIMAT) 20-100 MCG/ACT AERS respimat INHALE 1 PUFF INTO THE LUNGS EVERY 6 HOURS 3 Inhaler 3  . lisinopril (PRINIVIL,ZESTRIL) 40 MG tablet Take 1 tablet (40 mg total) by mouth daily. 90 tablet 3  . nitroGLYCERIN (NITROSTAT) 0.4 MG SL tablet Place 1 tablet (0.4 mg total) under the tongue every 5 (five) minutes as needed for chest pain. 25 tablet 4  . NON FORMULARY at bedtime. CPAP    . potassium chloride (K-DUR) 10 MEQ tablet Take 1 tablet (10 mEq total) by mouth daily. 90 tablet 3   No current facility-administered medications for this visit.    LABS/IMAGING: No results found for this or any previous visit (from the past 48 hour(s)). No results found.  VITALS: BP 134/86 mmHg  Pulse 73  Ht 6\' 4"  (1.93 m)  Wt 259 lb (117.482 kg)  BMI 31.54 kg/m2  EXAM: General appearance: alert and no distress Neck: no adenopathy, no carotid bruit, no JVD, supple, symmetrical, trachea midline and thyroid not enlarged, symmetric, no tenderness/mass/nodules Lungs: clear to auscultation bilaterally Heart: regular rate and rhythm, S1, S2 normal, no murmur, click, rub or gallop Abdomen: soft, non-tender; bowel sounds normal; no masses,  no organomegaly Extremities: extremities normal, atraumatic, no cyanosis or edema Pulses: 2+ and symmetric Skin: Skin color, texture, turgor normal. No rashes or lesions Neurologic: Grossly normal  EKG: Sinus rhythm at 73 with nonspecific ST and T changes  ASSESSMENT: 1. Progressive dyspnea on exertion and lower extremity edema 2. Hypertension-controlled 3. Dyslipidemia-taken off statins (?why) 4. COPD - now on oxygen 5. Intermittent coronary spasm  PLAN: 1.   Curtis Riley has had some progressive shortness of breath over the past several months. This is associated with worsening lower extremity edema. He feels that this may be out of proportion to his  COPD. I'm wondering if he could've developed some degree of right heart failure associated with this lung disease. I think he would benefit from an increase in his diuretics and I would recommend he take the Lasix 40 mg tablet every day instead of every other day. He should also take his potassium every day. We'll go ahead and obtain lab work including a BMP and BNP. We'll also recheck an echocardiogram. His last study was over 10 years ago. He did have a Carter catheterization in 2012 which showed no significant coronary disease.   Plan to see him back for follow-up and discuss results of the studies in a few weeks.  Pixie Casino, MD, [facility] Attending Cardiologist [facility] 09/25/2014, 5:59 PM

## 2014-09-26 LAB — BRAIN NATRIURETIC PEPTIDE: Brain Natriuretic Peptide: 43.6 pg/mL (ref 0.0–100.0)

## 2014-09-28 ENCOUNTER — Ambulatory Visit (HOSPITAL_COMMUNITY)
Admission: RE | Admit: 2014-09-28 | Discharge: 2014-09-28 | Disposition: A | Discharge status: Home / Self Care | Payer: Medicare Other | Source: Ambulatory Visit | Attending: Internal Medicine | Admitting: Internal Medicine

## 2014-09-28 DIAGNOSIS — E785 Hyperlipidemia, unspecified: Secondary | ICD-10-CM | POA: Diagnosis not present

## 2014-09-28 DIAGNOSIS — Z87891 Personal history of nicotine dependence: Secondary | ICD-10-CM | POA: Diagnosis not present

## 2014-09-28 DIAGNOSIS — Z8249 Family history of ischemic heart disease and other diseases of the circulatory system: Secondary | ICD-10-CM | POA: Insufficient documentation

## 2014-09-28 DIAGNOSIS — R0600 Dyspnea, unspecified: Secondary | ICD-10-CM | POA: Diagnosis not present

## 2014-09-28 DIAGNOSIS — I10 Essential (primary) hypertension: Secondary | ICD-10-CM | POA: Insufficient documentation

## 2014-09-28 DIAGNOSIS — R0602 Shortness of breath: Secondary | ICD-10-CM

## 2014-09-28 DIAGNOSIS — J449 Chronic obstructive pulmonary disease, unspecified: Secondary | ICD-10-CM

## 2014-09-28 DIAGNOSIS — M7989 Other specified soft tissue disorders: Secondary | ICD-10-CM

## 2014-09-28 NOTE — Progress Notes (Addendum)
2D Echocardiogram Complete.  09/28/2014   Froylan Hobby, RDCS   Technically difficult study, unable to obtain some apical images due to lung artifact from COPD.  Definity Contrast will not improve this exam.

## 2014-10-01 NOTE — Addendum Note (Signed)
Addended by: Vear Clock on: 10/01/2014 04:18 PM   Modules accepted: Orders

## 2014-10-07 ENCOUNTER — Encounter (HOSPITAL_COMMUNITY): Payer: Self-pay | Admitting: Emergency Medicine

## 2014-10-07 ENCOUNTER — Emergency Department (HOSPITAL_COMMUNITY): Payer: Medicare Other

## 2014-10-07 ENCOUNTER — Inpatient Hospital Stay (HOSPITAL_COMMUNITY)
Admission: EM | Admit: 2014-10-07 | Discharge: 2014-10-10 | DRG: 193 | Disposition: A | Discharge status: Home Health | Payer: Medicare Other | Attending: Internal Medicine | Admitting: Internal Medicine

## 2014-10-07 DIAGNOSIS — Z8673 Personal history of transient ischemic attack (TIA), and cerebral infarction without residual deficits: Secondary | ICD-10-CM | POA: Diagnosis not present

## 2014-10-07 DIAGNOSIS — J9621 Acute and chronic respiratory failure with hypoxia: Secondary | ICD-10-CM | POA: Diagnosis present

## 2014-10-07 DIAGNOSIS — Z87891 Personal history of nicotine dependence: Secondary | ICD-10-CM

## 2014-10-07 DIAGNOSIS — Z9981 Dependence on supplemental oxygen: Secondary | ICD-10-CM

## 2014-10-07 DIAGNOSIS — R0602 Shortness of breath: Secondary | ICD-10-CM | POA: Diagnosis not present

## 2014-10-07 DIAGNOSIS — J9611 Chronic respiratory failure with hypoxia: Secondary | ICD-10-CM | POA: Diagnosis not present

## 2014-10-07 DIAGNOSIS — J441 Chronic obstructive pulmonary disease with (acute) exacerbation: Secondary | ICD-10-CM | POA: Diagnosis not present

## 2014-10-07 DIAGNOSIS — Z23 Encounter for immunization: Secondary | ICD-10-CM

## 2014-10-07 DIAGNOSIS — Z8249 Family history of ischemic heart disease and other diseases of the circulatory system: Secondary | ICD-10-CM

## 2014-10-07 DIAGNOSIS — R509 Fever, unspecified: Secondary | ICD-10-CM | POA: Diagnosis not present

## 2014-10-07 DIAGNOSIS — Z833 Family history of diabetes mellitus: Secondary | ICD-10-CM | POA: Diagnosis not present

## 2014-10-07 DIAGNOSIS — I10 Essential (primary) hypertension: Secondary | ICD-10-CM | POA: Diagnosis not present

## 2014-10-07 DIAGNOSIS — Z880 Allergy status to penicillin: Secondary | ICD-10-CM

## 2014-10-07 DIAGNOSIS — J189 Pneumonia, unspecified organism: Principal | ICD-10-CM | POA: Diagnosis present

## 2014-10-07 DIAGNOSIS — E785 Hyperlipidemia, unspecified: Secondary | ICD-10-CM | POA: Diagnosis present

## 2014-10-07 DIAGNOSIS — G4730 Sleep apnea, unspecified: Secondary | ICD-10-CM | POA: Diagnosis present

## 2014-10-07 DIAGNOSIS — I2510 Atherosclerotic heart disease of native coronary artery without angina pectoris: Secondary | ICD-10-CM | POA: Diagnosis present

## 2014-10-07 DIAGNOSIS — N400 Enlarged prostate without lower urinary tract symptoms: Secondary | ICD-10-CM | POA: Diagnosis present

## 2014-10-07 DIAGNOSIS — I201 Angina pectoris with documented spasm: Secondary | ICD-10-CM | POA: Diagnosis present

## 2014-10-07 DIAGNOSIS — I5032 Chronic diastolic (congestive) heart failure: Secondary | ICD-10-CM | POA: Diagnosis present

## 2014-10-07 DIAGNOSIS — M1990 Unspecified osteoarthritis, unspecified site: Secondary | ICD-10-CM | POA: Diagnosis present

## 2014-10-07 DIAGNOSIS — D72829 Elevated white blood cell count, unspecified: Secondary | ICD-10-CM | POA: Diagnosis not present

## 2014-10-07 DIAGNOSIS — J181 Lobar pneumonia, unspecified organism: Secondary | ICD-10-CM | POA: Diagnosis not present

## 2014-10-07 DIAGNOSIS — J449 Chronic obstructive pulmonary disease, unspecified: Secondary | ICD-10-CM | POA: Diagnosis present

## 2014-10-07 DIAGNOSIS — J9601 Acute respiratory failure with hypoxia: Secondary | ICD-10-CM | POA: Diagnosis not present

## 2014-10-07 LAB — COMPREHENSIVE METABOLIC PANEL
ALT: 31 U/L (ref 0–53)
AST: 31 U/L (ref 0–37)
Albumin: 2.9 g/dL — ABNORMAL LOW (ref 3.5–5.2)
Alkaline Phosphatase: 73 U/L (ref 39–117)
Anion gap: 10 (ref 5–15)
BUN: 14 mg/dL (ref 6–23)
CALCIUM: 8.9 mg/dL (ref 8.4–10.5)
CO2: 26 mmol/L (ref 19–32)
CREATININE: 1.1 mg/dL (ref 0.50–1.35)
Chloride: 104 mmol/L (ref 96–112)
GFR calc Af Amer: 73 mL/min — ABNORMAL LOW (ref >90)
GFR, EST NON AFRICAN AMERICAN: 63 mL/min — AB (ref >90)
Glucose, Bld: 120 mg/dL — ABNORMAL HIGH (ref 70–99)
Potassium: 3.5 mmol/L (ref 3.5–5.1)
Sodium: 140 mmol/L (ref 135–145)
Total Bilirubin: 2.1 mg/dL — ABNORMAL HIGH (ref 0.3–1.2)
Total Protein: 7.1 g/dL (ref 6.0–8.3)

## 2014-10-07 LAB — CBC WITH DIFFERENTIAL/PLATELET
BASOS PCT: 0 % (ref 0–1)
Basophils Absolute: 0 10*3/uL (ref 0.0–0.1)
EOS ABS: 0.1 10*3/uL (ref 0.0–0.7)
Eosinophils Relative: 1 % (ref 0–5)
HCT: 38.3 % — ABNORMAL LOW (ref 39.0–52.0)
HEMOGLOBIN: 12.3 g/dL — AB (ref 13.0–17.0)
LYMPHS ABS: 1.8 10*3/uL (ref 0.7–4.0)
Lymphocytes Relative: 16 % (ref 12–46)
MCH: 30.7 pg (ref 26.0–34.0)
MCHC: 32.1 g/dL (ref 30.0–36.0)
MCV: 95.5 fL (ref 78.0–100.0)
Monocytes Absolute: 1.5 10*3/uL — ABNORMAL HIGH (ref 0.1–1.0)
Monocytes Relative: 13 % — ABNORMAL HIGH (ref 3–12)
Neutro Abs: 8 10*3/uL — ABNORMAL HIGH (ref 1.7–7.7)
Neutrophils Relative %: 70 % (ref 43–77)
PLATELETS: 240 10*3/uL (ref 150–400)
RBC: 4.01 MIL/uL — AB (ref 4.22–5.81)
RDW: 12.3 % (ref 11.5–15.5)
WBC: 11.4 10*3/uL — ABNORMAL HIGH (ref 4.0–10.5)

## 2014-10-07 LAB — INFLUENZA PANEL BY PCR (TYPE A & B)
H1N1 flu by pcr: NOT DETECTED
INFLAPCR: NEGATIVE
INFLBPCR: NEGATIVE

## 2014-10-07 LAB — BRAIN NATRIURETIC PEPTIDE: B NATRIURETIC PEPTIDE 5: 114.3 pg/mL — AB (ref 0.0–100.0)

## 2014-10-07 LAB — I-STAT TROPONIN, ED: Troponin i, poc: 0.01 ng/mL (ref 0.00–0.08)

## 2014-10-07 LAB — I-STAT CG4 LACTIC ACID, ED: Lactic Acid, Venous: 1.47 mmol/L (ref 0.5–2.0)

## 2014-10-07 MED ORDER — POLYETHYLENE GLYCOL 3350 17 G PO PACK: 17.0000 g | PACK | Freq: Every day | ORAL | Status: DC | PRN

## 2014-10-07 MED ORDER — ENOXAPARIN SODIUM 40 MG/0.4ML ~~LOC~~ SOLN
40.0000 mg | SUBCUTANEOUS | Status: DC
  Administered 2014-10-07 – 2014-10-09 (×3): 40 mg via SUBCUTANEOUS
  Filled 2014-10-07 (×4): qty 0.4

## 2014-10-07 MED ORDER — IPRATROPIUM-ALBUTEROL 0.5-2.5 (3) MG/3ML IN SOLN
3.0000 mL | RESPIRATORY_TRACT | Status: DC | PRN
  Administered 2014-10-07 – 2014-10-10 (×6): 3 mL via RESPIRATORY_TRACT
  Filled 2014-10-07 (×6): qty 3

## 2014-10-07 MED ORDER — CARVEDILOL 25 MG PO TABS
25.0000 mg | ORAL_TABLET | Freq: Two times a day (BID) | ORAL | Status: DC
  Administered 2014-10-07 – 2014-10-10 (×7): 25 mg via ORAL
  Filled 2014-10-07 (×8): qty 1

## 2014-10-07 MED ORDER — SODIUM CHLORIDE 0.9 % IV SOLN
1000.0000 mL | INTRAVENOUS | Status: DC
  Administered 2014-10-07 – 2014-10-10 (×7): 1000 mL via INTRAVENOUS

## 2014-10-07 MED ORDER — SODIUM CHLORIDE 0.9 % IV SOLN: 250.0000 mL | INTRAVENOUS | Status: DC | PRN

## 2014-10-07 MED ORDER — SODIUM CHLORIDE 0.9 % IV SOLN
1000.0000 mL | Freq: Once | INTRAVENOUS | Status: AC
  Administered 2014-10-07: 1000 mL via INTRAVENOUS

## 2014-10-07 MED ORDER — SODIUM CHLORIDE 0.9 % IJ SOLN: 3.0000 mL | INTRAMUSCULAR | Status: DC | PRN

## 2014-10-07 MED ORDER — LEVOFLOXACIN IN D5W 750 MG/150ML IV SOLN
750.0000 mg | INTRAVENOUS | Status: DC
  Administered 2014-10-08 – 2014-10-10 (×3): 750 mg via INTRAVENOUS
  Filled 2014-10-07 (×3): qty 150

## 2014-10-07 MED ORDER — LISINOPRIL 40 MG PO TABS
40.0000 mg | ORAL_TABLET | Freq: Every day | ORAL | Status: DC
  Administered 2014-10-07 – 2014-10-10 (×4): 40 mg via ORAL
  Filled 2014-10-07 (×4): qty 1

## 2014-10-07 MED ORDER — ONDANSETRON HCL 4 MG PO TABS: 4.0000 mg | ORAL_TABLET | Freq: Four times a day (QID) | ORAL | Status: DC | PRN

## 2014-10-07 MED ORDER — HYDROCODONE-ACETAMINOPHEN 5-325 MG PO TABS: 1.0000 | ORAL_TABLET | ORAL | Status: DC | PRN

## 2014-10-07 MED ORDER — SODIUM CHLORIDE 0.9 % IJ SOLN: 3.0000 mL | Freq: Two times a day (BID) | INTRAMUSCULAR | Status: DC

## 2014-10-07 MED ORDER — ASPIRIN 81 MG PO CHEW
81.0000 mg | CHEWABLE_TABLET | Freq: Every day | ORAL | Status: DC
Start: 2014-10-07 — End: 2014-10-10
  Administered 2014-10-07 – 2014-10-10 (×4): 81 mg via ORAL
  Filled 2014-10-07 (×4): qty 1

## 2014-10-07 MED ORDER — FUROSEMIDE 40 MG PO TABS
40.0000 mg | ORAL_TABLET | Freq: Every day | ORAL | Status: DC
  Administered 2014-10-07 – 2014-10-10 (×4): 40 mg via ORAL
  Filled 2014-10-07 (×4): qty 1

## 2014-10-07 MED ORDER — POTASSIUM CHLORIDE ER 10 MEQ PO TBCR
10.0000 meq | EXTENDED_RELEASE_TABLET | Freq: Every day | ORAL | Status: DC
  Administered 2014-10-07 – 2014-10-10 (×4): 10 meq via ORAL
  Filled 2014-10-07 (×4): qty 1

## 2014-10-07 MED ORDER — LEVOFLOXACIN IN D5W 750 MG/150ML IV SOLN
750.0000 mg | Freq: Once | INTRAVENOUS | Status: AC
  Administered 2014-10-07: 750 mg via INTRAVENOUS
  Filled 2014-10-07: qty 150

## 2014-10-07 MED ORDER — IPRATROPIUM-ALBUTEROL 20-100 MCG/ACT IN AERS: 1.0000 | INHALATION_SPRAY | Freq: Four times a day (QID) | RESPIRATORY_TRACT | Status: DC | PRN

## 2014-10-07 MED ORDER — ONDANSETRON HCL 4 MG/2ML IJ SOLN: 4.0000 mg | Freq: Four times a day (QID) | INTRAMUSCULAR | Status: DC | PRN

## 2014-10-07 NOTE — ED Notes (Signed)
Pt transported to XRAY °

## 2014-10-07 NOTE — ED Provider Notes (Signed)
CSN: [id number]     Arrival date & time 10/07/14  0805 History   First MD Initiated Contact with Patient 10/07/14 [phone]     Chief Complaint  Patient presents with  . Shortness of Breath     (Consider location/radiation/quality/duration/timing/severity/associated sxs/prior Treatment) Patient is a 80 y.o. male presenting with shortness of breath. The history is provided by the patient.  Shortness of Breath Severity:  Moderate Onset quality:  Gradual Duration:  6 days Timing:  Constant Progression:  Waxing and waning Chronicity:  New Context: URI   Relieved by:  Rest, oxygen and inhaler Worsened by:  Activity and exertion Ineffective treatments:  None tried Associated symptoms: cough, fever, sputum production, vomiting and wheezing   Associated symptoms: no abdominal pain, no chest pain and no rash   Associated symptoms comment:  1 episode of vomiting but not continuous.  No appetite with decreased po intake.  Denies leg swelling.  Dyspnea on exertion Risk factors: no recent alcohol use, no prolonged immobilization, no recent surgery and no tobacco use   Risk factors comment:  Recently had an MRI of his heart but denies prior CHF   Past Medical History  Diagnosis Date  . BPH (benign prostatic hyperplasia)   . Smoker   . CVA (cerebral infarction)   . Hypertension   . COPD (chronic obstructive pulmonary disease)   . Diverticulosis   . Angina     occasional chest pain  never been treated for this ...   . Shortness of breath     with exertion   . Sleep apnea     uses cpap  . Kidney stones     10-15 yrs ago  . Neuromuscular disorder   . Arthritis   . Dyslipidemia   . Family history of coronary artery disease     brothers, sisters  . History of nuclear stress test 03/2010    dobutamine; mild ischemia in mid inferior and apical inferior regions; ekg negative for ischemia; low risk scan    Past Surgical History  Procedure Laterality Date  . Transurethral resection of  prostate  08/1996  . Transurethral resection of prostate      long time ago per pt.  10-15 yrs  . Cardiac catheterization  10/2010    small bridging segment in LAD  . Tonsillectomy  age 89  . Anterior cervical decomp/discectomy fusion  10/06/2011    Procedure: ANTERIOR CERVICAL DECOMPRESSION/DISCECTOMY FUSION 3 LEVELS;  Surgeon: Faythe Ghee, MD;  Location: [facility];  Service: Neurosurgery;  Laterality: Bilateral;  Cervical three-four, four-five, five-six anterior cervical decompression, fusion  . Transthoracic echocardiogram  2004    normal LV size and function, aortic root dilated   Family History  Problem Relation Age of Onset  . Arthritis Mother   . Hypertension Mother   . Arthritis Father   . Hypertension Father   . Arthritis Sister   . Diabetes Sister   . Hypertension Sister   . Arthritis Brother   . Hypertension Brother    History  Substance Use Topics  . Smoking status: Former Smoker -- 1.00 packs/day for 20 years    Types: Cigarettes    Quit date: 07/20/1998  . Smokeless tobacco: Never Used  . Alcohol Use: No    Review of Systems  Constitutional: Positive for fever.  Respiratory: Positive for cough, sputum production, shortness of breath and wheezing.   Cardiovascular: Negative for chest pain.  Gastrointestinal: Positive for vomiting. Negative for abdominal pain.  Skin: Negative  for rash.  All other systems reviewed and are negative.     Allergies  Penicillins  Home Medications   Prior to Admission medications   Medication Sig Start Date End Date Taking? Authorizing Provider  aspirin 81 MG tablet Take 81 mg by mouth daily.    Historical Provider, MD  carvedilol (COREG) 25 MG tablet Take 1 tablet (25 mg total) by mouth 2 (two) times daily. 02/26/14   Pixie Casino, MD  furosemide (LASIX) 40 MG tablet Take 1 tablet (40 mg total) by mouth daily. 09/25/14   Pixie Casino, MD  Ipratropium-Albuterol (COMBIVENT RESPIMAT) 20-100 MCG/ACT AERS respimat INHALE  1 PUFF INTO THE LUNGS EVERY 6 HOURS 10/04/13   Denita Lung, MD  lisinopril (PRINIVIL,ZESTRIL) 40 MG tablet Take 1 tablet (40 mg total) by mouth daily. 09/25/14   Pixie Casino, MD  nitroGLYCERIN (NITROSTAT) 0.4 MG SL tablet Place 1 tablet (0.4 mg total) under the tongue every 5 (five) minutes as needed for chest pain. 03/29/13   Pixie Casino, MD  NON FORMULARY at bedtime. CPAP    Historical Provider, MD  potassium chloride (K-DUR) 10 MEQ tablet Take 1 tablet (10 mEq total) by mouth daily. 09/25/14   Pixie Casino, MD   BP 148/74 mmHg  Pulse 90  Temp(Src) 99.4 F (37.4 C) (Oral)  Resp 20  SpO2 98% Physical Exam  Constitutional: He is oriented to person, place, and time. He appears well-developed and well-nourished. No distress.  HENT:  Head: Normocephalic and atraumatic.  Mouth/Throat: Oropharynx is clear and moist. Mucous membranes are dry.  Eyes: Conjunctivae and EOM are normal. Pupils are equal, round, and reactive to light.  Neck: Normal range of motion. Neck supple.  Cardiovascular: Normal rate, regular rhythm and intact distal pulses.   No murmur heard. Pulmonary/Chest: Effort normal and breath sounds normal. No respiratory distress. He has no wheezes. He has no rales.  Abdominal: Soft. He exhibits no distension. There is no tenderness. There is no rebound and no guarding.  Musculoskeletal: Normal range of motion. He exhibits edema. He exhibits no tenderness.  Trace edema in the lower ext  Neurological: He is alert and oriented to person, place, and time.  Skin: Skin is warm and dry. No rash noted. No erythema.  Psychiatric: He has a normal mood and affect. His behavior is normal.  Nursing note and vitals reviewed.   ED Course  Procedures (including critical care time) Labs Review Labs Reviewed  CBC WITH DIFFERENTIAL/PLATELET  COMPREHENSIVE METABOLIC PANEL  BRAIN NATRIURETIC PEPTIDE  INFLUENZA PANEL BY PCR (TYPE A & B, H1N1)  I-STAT TROPOININ, ED  I-STAT CG4 LACTIC  ACID, ED    Imaging Review Dg Chest 2 View  10/07/2014   CLINICAL DATA:  Shortness of breath for 1 week  EXAM: CHEST  2 VIEW  COMPARISON:  11/14/2013  FINDINGS: Cardiac shadow is within normal limits. The lungs are well aerated bilaterally right lower lobe pneumonia is seen new from the prior exam. No other focal abnormality is noted.  IMPRESSION: Right lower lobe pneumonia. Followup films following appropriate therapy are recommended.   Electronically Signed   By: Inez Catalina M.D.   On: 10/07/2014 08:44     EKG Interpretation   Date/Time:  Sunday October 07 2014 08:12:31 EDT Ventricular Rate:  87 PR Interval:  155 QRS Duration: 119 QT Interval:  358 QTC Calculation: 431 R Axis:   -15 Text Interpretation:  Sinus rhythm Normal ECG No significant change since  last tracing Confirmed by [facility]  MD, Loree Fee ([id number]) on 10/07/2014  8:33:59 AM      MDM   Final diagnoses:  SOB (shortness of breath)    Patient with a prior history of hypertension, COPD, angina who presents today with 6 days of intermittent chills, fever, mild productive cough and shortness of breath most significantly on exertion. Patient called EMS today due to symptoms not improving. Initially per EMS patient was clear breath sounds on arrival and then after taking 10 steps he began to wheeze and become significantly short of breath. He was febrile to 100.1 with EMS and was given Tylenol prior to arrival. Patient also received Solu-Medrol, Atrovent and albuterol in route. On arrival here patient's vital signs are stable with oxygen saturation of 98%. His lungs are clear at this time. He does not display any significant edema in his lower extremities and he has not noted any new swelling or weight change. For the last 1 week he has not been eating due to having no appetite. He has received a flu shot this season but denies ever having a pneumonia shot. He is been using breathing treatments at home with minimal benefit.  9:04  AM Pt found to have RLL PNA on CXR.  Pt started on levaquin due to anaphylaxis to penicillin.    Blanchie Dessert, MD 10/07/14 [phone]

## 2014-10-07 NOTE — Progress Notes (Signed)
ANTIBIOTIC CONSULT NOTE - INITIAL  Pharmacy Consult for Levaquin Indication: pneumonia  Allergies  Allergen Reactions  . Penicillins Anaphylaxis    Patient Measurements: Height: 6\' 4"  (193 cm) Weight: 265 lb (120.203 kg) IBW/kg (Calculated) : 86.8  Vital Signs: Temp: 99 F (37.2 C) (03/20 1200) Temp Source: Oral (03/20 1200) BP: 147/92 mmHg (03/20 1200) Pulse Rate: 84 (03/20 1200) Intake/Output from previous day:   Intake/Output from this shift:    Labs:  Recent Labs  10/07/14 0849  WBC 11.4*  HGB 12.3*  PLT 240  CREATININE 1.10   Estimated Creatinine Clearance: 79.7 mL/min (by C-G formula based on Cr of 1.1). No results for input(s): VANCOTROUGH, VANCOPEAK, VANCORANDOM, GENTTROUGH, GENTPEAK, GENTRANDOM, TOBRATROUGH, TOBRAPEAK, TOBRARND, AMIKACINPEAK, AMIKACINTROU, AMIKACIN in the last 72 hours.   Microbiology: No results found for this or any previous visit (from the past 720 hour(s)).  Medical History: Past Medical History  Diagnosis Date  . BPH (benign prostatic hyperplasia)   . Smoker   . CVA (cerebral infarction)   . Hypertension   . COPD (chronic obstructive pulmonary disease)   . Diverticulosis   . Angina     occasional chest pain  never been treated for this ...   . Shortness of breath     with exertion   . Sleep apnea     uses cpap  . Kidney stones     10-15 yrs ago  . Neuromuscular disorder   . Arthritis   . Dyslipidemia   . Family history of coronary artery disease     brothers, sisters  . History of nuclear stress test 03/2010    dobutamine; mild ischemia in mid inferior and apical inferior regions; ekg negative for ischemia; low risk scan     Assessment: 9 yoM with h/o COPD on nocturnal O2 presents with fevers, chills, dyspnea.  In the Ed found to have RLL PNA on CXR, leukocytosis, low grade temp.  Pharmacy consulted to dose Levaquin for CAP.  Patient received dose of Levaquin in ED.  3/20 >> Levaquin >>  Tmax: 99 WBC: mildly  elevated Renal: SCr 1.10, CrCl~94 ml/min  3/20 influenza panel: IP  Goal of Therapy:  Doses adjusted per renal function Eradication of infection  Plan:  1.  Levaquin 750 mg IV q24h 2.  F/u SCr, clinical course.  Hershal Coria 10/07/2014,1:41 PM

## 2014-10-07 NOTE — H&P (Signed)
[facility]  History and Physical Curtis Riley L. Ardeth Perfect, MD Pager [phone] (if 7P to 7A, page night hospitalist on [email or web address] OQH:[id number] DOB: 11-Oct-1934 DOA: 10/07/2014  Referring physician: ED  PCP: Wyatt Haste, MD   Chief Complaint: fevers,chills, dyspnea   HPI:  Pt w/ known hx of COPD on nocturnal O2 and prn inhalers only, HTN who presents w/ progressive need for supplemental O2, fevers, chills , dyspnea . In the Ed found to have RLL PNA on CXR, leukocytosis, low grade temp. He was given IVF in the ED and levaquin and [facility] was asked to admit for further treatment. He appears in no distress in ED at this time . Vitals stable. Will admit for abx   Chart Review:  Prior notes , ED notes, labs , images   Review of Systems:  Neg except as noted above   Past Medical History  Diagnosis Date  . BPH (benign prostatic hyperplasia)   . Smoker   . CVA (cerebral infarction)   . Hypertension   . COPD (chronic obstructive pulmonary disease)   . Diverticulosis   . Angina     occasional chest pain  never been treated for this ...   . Shortness of breath     with exertion   . Sleep apnea     uses cpap  . Kidney stones     10-15 yrs ago  . Neuromuscular disorder   . Arthritis   . Dyslipidemia   . Family history of coronary artery disease     brothers, sisters  . History of nuclear stress test 03/2010    dobutamine; mild ischemia in mid inferior and apical inferior regions; ekg negative for ischemia; low risk scan     Past Surgical History  Procedure Laterality Date  . Transurethral resection of prostate  08/1996  . Transurethral resection of prostate      long time ago per pt.  10-15 yrs  . Cardiac catheterization  10/2010    small bridging segment in LAD  . Tonsillectomy  age 70  . Anterior cervical decomp/discectomy fusion  10/06/2011    Procedure: ANTERIOR CERVICAL DECOMPRESSION/DISCECTOMY FUSION 3 LEVELS;  Surgeon: Faythe Ghee, MD;  Location:  [facility];  Service: Neurosurgery;  Laterality: Bilateral;  Cervical three-four, four-five, five-six anterior cervical decompression, fusion  . Transthoracic echocardiogram  2004    normal LV size and function, aortic root dilated    Social History:  reports that he quit smoking about 16 years ago. His smoking use included Cigarettes. He has a 20 pack-year smoking history. He has never used smokeless tobacco. He reports that he does not drink alcohol or use illicit drugs.  Allergies  Allergen Reactions  . Penicillins Anaphylaxis    Family History  Problem Relation Age of Onset  . Arthritis Mother   . Hypertension Mother   . Arthritis Father   . Hypertension Father   . Arthritis Sister   . Diabetes Sister   . Hypertension Sister   . Arthritis Brother   . Hypertension Brother      Prior to Admission medications   Medication Sig Start Date End Date Taking? Authorizing Provider  aspirin 81 MG tablet Take 81 mg by mouth daily.   Yes Historical Provider, MD  carvedilol (COREG) 25 MG tablet Take 1 tablet (25 mg total) by mouth 2 (two) times daily. 02/26/14  Yes Pixie Casino, MD  furosemide (LASIX) 40 MG tablet Take 1 tablet (40 mg total) by  mouth daily. 09/25/14  Yes Pixie Casino, MD  Nyoka Cowden Tea, Camillia sinensis, (GREEN TEA PO) Take 1 tablet by mouth daily.   Yes Historical Provider, MD  ibuprofen (ADVIL,MOTRIN) 200 MG tablet Take 200-400 mg by mouth every 6 (six) hours as needed for mild pain.   Yes Historical Provider, MD  Ipratropium-Albuterol (COMBIVENT RESPIMAT) 20-100 MCG/ACT AERS respimat INHALE 1 PUFF INTO THE LUNGS EVERY 6 HOURS 10/04/13  Yes Denita Lung, MD  lisinopril (PRINIVIL,ZESTRIL) 40 MG tablet Take 1 tablet (40 mg total) by mouth daily. 09/25/14  Yes Pixie Casino, MD  NON FORMULARY at bedtime. CPAP   Yes Historical Provider, MD  potassium chloride (K-DUR) 10 MEQ tablet Take 1 tablet (10 mEq total) by mouth daily. 09/25/14  Yes Pixie Casino, MD  nitroGLYCERIN  (NITROSTAT) 0.4 MG SL tablet Place 1 tablet (0.4 mg total) under the tongue every 5 (five) minutes as needed for chest pain. 03/29/13   Pixie Casino, MD   Physical Exam: Marcelline Mates:   10/07/14 8299 10/07/14 0923 10/07/14 0924 10/07/14 1000  BP:   144/84   Pulse:   87 77  Temp:      TempSrc:      Resp:   20   Weight: 256 lb (116.121 kg)     SpO2:  90% 92% 95%     General:  AAM in AND   HEENT: MMM, anicteric   Cardiovascular: RRR, no mrg   Respiratory: ctab, no wheezes, rales   Abdomen: soft, nt, nd   Skin: warm , dry   Musculoskeletal: no focal deficits   Psychiatric: no anxiety or depression   Neurologic: no focal deficits   Wt Readings from Last 3 Encounters:  10/07/14 256 lb (116.121 kg)  09/25/14 259 lb (117.482 kg)  03/29/14 258 lb 4.8 oz (117.164 kg)    Labs on Admission:  Basic Metabolic Panel:  Recent Labs Lab 10/07/14 0849  NA 140  K 3.5  CL 104  CO2 26  GLUCOSE 120*  BUN 14  CREATININE 1.10  CALCIUM 8.9    Liver Function Tests:  Recent Labs Lab 10/07/14 0849  AST 31  ALT 31  ALKPHOS 73  BILITOT 2.1*  PROT 7.1  ALBUMIN 2.9*   No results for input(s): LIPASE, AMYLASE in the last 168 hours. No results for input(s): AMMONIA in the last 168 hours.  CBC:  Recent Labs Lab 10/07/14 0849  WBC 11.4*  NEUTROABS 8.0*  HGB 12.3*  HCT 38.3*  MCV 95.5  PLT 240    Cardiac Enzymes: No results for input(s): CKTOTAL, CKMB, CKMBINDEX, TROPONINI in the last 168 hours.  Troponin (Point of Care Test)  Recent Labs  10/07/14 0853  TROPIPOC 0.01    BNP (last 3 results) No results for input(s): PROBNP in the last 8760 hours.  CBG: No results for input(s): GLUCAP in the last 168 hours.   Radiological Exams on Admission: Dg Chest 2 View  10/07/2014   CLINICAL DATA:  Shortness of breath for 1 week  EXAM: CHEST  2 VIEW  COMPARISON:  11/14/2013  FINDINGS: Cardiac shadow is within normal limits. The lungs are well aerated bilaterally  right lower lobe pneumonia is seen new from the prior exam. No other focal abnormality is noted.  IMPRESSION: Right lower lobe pneumonia. Followup films following appropriate therapy are recommended.   Electronically Signed   By: Inez Catalina M.D.   On: 10/07/2014 08:44     Active Problems:   CAP (  community acquired pneumonia)   Assessment/Plan 1. CAP - hx of CHF on diuretic so will hold on IVF at this time and encourage PO hydration , he did receive IVF in ED. Treat w/ levaquin given PCN allergy . Daily CBC. Repeat CXR in 1-2 days .  2. COPD - home inhaler to use prn , place on O2 , add prn nebs to use if needed for dysnpea  3. HTN - home meds    Code Status: full Family Communication: none  Disposition Plan/Anticipated LOS: 2-3 days   Time spent: 30 minutes   Velna Hatchet, MD  Internal Medicine Pager [phone] If 7PM-7AM, please contact night-coverage at [email or web address], password [email or web address] 10/07/2014, 10:32 AM

## 2014-10-07 NOTE — ED Notes (Signed)
Attempted to call report to [address]. RN unable to take report at this time.

## 2014-10-07 NOTE — ED Notes (Signed)
Pt from home via [facility] c/o shortness of breath x 3 days that has not gotten any better. He reports using inhaler x 2 without relief. 20 G IV L Hand. 125 mg solumedrol given, 5 albuterol and 0.5 atrovent, and 1000 mg tylenol for fever of 100.1  given en route. Pt alert and oriented upon assessment.

## 2014-10-07 NOTE — ED Notes (Signed)
Bed: YF11 Expected date: 10/07/14 Expected time: 7:54 AM Means of arrival:  Comments: EMS/Repiratory

## 2014-10-07 NOTE — Progress Notes (Addendum)
Spoke with pt regarding cpap.  Pt stated he doesn't want to wear cpap tonight, only oxygen in his nose.  Pt remains on 2lnc.  Pt was advised that RT is available all night should he change his mind.  RN aware.

## 2014-10-07 NOTE — ED Notes (Addendum)
Report given to [facility] on [facility] W.

## 2014-10-08 DIAGNOSIS — J9601 Acute respiratory failure with hypoxia: Secondary | ICD-10-CM

## 2014-10-08 DIAGNOSIS — J441 Chronic obstructive pulmonary disease with (acute) exacerbation: Secondary | ICD-10-CM

## 2014-10-08 DIAGNOSIS — D72829 Elevated white blood cell count, unspecified: Secondary | ICD-10-CM

## 2014-10-08 DIAGNOSIS — J181 Lobar pneumonia, unspecified organism: Secondary | ICD-10-CM

## 2014-10-08 LAB — BASIC METABOLIC PANEL
Anion gap: 8 (ref 5–15)
BUN: 24 mg/dL — AB (ref 6–23)
CALCIUM: 8.6 mg/dL (ref 8.4–10.5)
CO2: 26 mmol/L (ref 19–32)
Chloride: 106 mmol/L (ref 96–112)
Creatinine, Ser: 1.13 mg/dL (ref 0.50–1.35)
GFR calc Af Amer: 70 mL/min — ABNORMAL LOW (ref >90)
GFR calc non Af Amer: 61 mL/min — ABNORMAL LOW (ref >90)
Glucose, Bld: 115 mg/dL — ABNORMAL HIGH (ref 70–99)
Potassium: 3.7 mmol/L (ref 3.5–5.1)
SODIUM: 140 mmol/L (ref 135–145)

## 2014-10-08 LAB — CBC
HCT: 36.6 % — ABNORMAL LOW (ref 39.0–52.0)
Hemoglobin: 11.6 g/dL — ABNORMAL LOW (ref 13.0–17.0)
MCH: 30.5 pg (ref 26.0–34.0)
MCHC: 31.7 g/dL (ref 30.0–36.0)
MCV: 96.3 fL (ref 78.0–100.0)
Platelets: 244 10*3/uL (ref 150–400)
RBC: 3.8 MIL/uL — ABNORMAL LOW (ref 4.22–5.81)
RDW: 12.4 % (ref 11.5–15.5)
WBC: 10.1 10*3/uL (ref 4.0–10.5)

## 2014-10-08 MED ORDER — CETYLPYRIDINIUM CHLORIDE 0.05 % MT LIQD
7.0000 mL | Freq: Two times a day (BID) | OROMUCOSAL | Status: DC
  Administered 2014-10-09 – 2014-10-10 (×3): 7 mL via OROMUCOSAL

## 2014-10-08 NOTE — Progress Notes (Signed)
Patient ID: Curtis Riley, male   DOB: 06-02-1935, 80 y.o.   MRN: [id number] [facility] PROGRESS NOTE  ANDROS CHANNING QMG:[id number] DOB: Mar 11, 1935 DOA: 10/07/2014 PCP: Wyatt Haste, MD  Brief narrative:    80 year old male with past medical history of COPD, using oxygen at home at nighttime, history of hypertension, diastolic CHF who presented to [facility] with progressively worsening shortness of breath especially with exertion, higher oxygen requirements, fevers and chills. On admission, patient was found to have right lower lobe pneumonia based on chest x-ray. He was started on Levaquin.   Assessment/Plan:    Principal problem: Acute respiratory failure with hypoxia / right lower lobe pneumonia/ leukocytosis / acute COPD exacerbation - Hypoxia likely secondary to pneumonia. Patient does have COPD as well so it could be because of COPD exacerbation in addition to pneumonia. - Continue oxygen support via nasal cannula to keep oxygen saturation above 97% - Patient started on Levaquin at the time of the admission for treatment of community-acquired pneumonia. - Influenza panel is negative. - Respiratory status is stable. - COPD Gold alert ordered.  Active problems: Chronic diastolic congestive heart failure - Compensated. BNP mildly elevated at 114. - Last 2-D echo on file in March 2016 with grade 1 diastolic dysfunction. Unable to assess left ventricular function. - Continue carvedilol 25 mg twice daily, Lasix 40 mg daily, lisinopril 40 mg daily - Continue aspirin daily  Essential hypertension - Continue carvedilol, Lasix and lisinopril   DVT Prophylaxis  - Lovenox subcutaneous ordered   Code Status: Full.  Family Communication:  plan of care discussed with the patient Disposition Plan: He is receiving antibiotics for pneumonia. Likely discharge in next 24 hours.  IV access:  Peripheral IV  Procedures and diagnostic studies:    Dg Chest 2  View 10/07/2014  Right lower lobe pneumonia. Followup films following appropriate therapy are recommended.     Medical Consultants:  None  Other Consultants:  None  IAnti-Infectives:   Levaquin 10/07/2014 -->   Leisa Lenz, MD  [facility] Pager [phone]  If 7PM-7AM, please contact night-coverage [email or web address] Password [facility] 10/08/2014, 10:36 AM   LOS: 1 day    HPI/Subjective: No acute overnight events.  Objective: Filed Vitals:   10/07/14 2048 10/07/14 2109 10/08/14 0631 10/08/14 1020  BP: 146/88  148/89 126/74  Pulse: 85  76 88  Temp: 97.9 F (36.6 C)  98 F (36.7 C) 97.9 F (36.6 C)  TempSrc: Oral  Oral Oral  Resp: 20  20 20   Height:      Weight:   113.717 kg (250 lb 11.2 oz)   SpO2: 97% 96% 97% 97%    Intake/Output Summary (Last 24 hours) at 10/08/14 1036 Last data filed at 10/08/14 0900  Gross per 24 hour  Intake 2592.7 ml  Output      0 ml  Net 2592.7 ml    Exam:   General:  Pt is alert, follows commands appropriately, not in acute distress  Cardiovascular: Regular rate and rhythm, S1/S2, no murmurs  Respiratory: Wheezing in upper lobes, no crackles  Abdomen: Soft, non tender, non distended, bowel sounds present  Extremities: No edema, pulses DP and PT palpable bilaterally  Neuro: Grossly nonfocal  Data Reviewed: Basic Metabolic Panel:  Recent Labs Lab 10/07/14 0849 10/08/14 0416  NA 140 140  K 3.5 3.7  CL 104 106  CO2 26 26  GLUCOSE 120* 115*  BUN 14 24*  CREATININE 1.10 1.13  CALCIUM 8.9 8.6  Liver Function Tests:  Recent Labs Lab 10/07/14 0849  AST 31  ALT 31  ALKPHOS 73  BILITOT 2.1*  PROT 7.1  ALBUMIN 2.9*   No results for input(s): LIPASE, AMYLASE in the last 168 hours. No results for input(s): AMMONIA in the last 168 hours. CBC:  Recent Labs Lab 10/07/14 0849 10/08/14 0416  WBC 11.4* 10.1  NEUTROABS 8.0*  --   HGB 12.3* 11.6*  HCT 38.3* 36.6*  MCV 95.5 96.3  PLT 240 244   Cardiac  Enzymes: No results for input(s): CKTOTAL, CKMB, CKMBINDEX, TROPONINI in the last 168 hours. BNP: Invalid input(s): POCBNP CBG: No results for input(s): GLUCAP in the last 168 hours.  No results found for this or any previous visit (from the past 240 hour(s)).   Scheduled Meds: . aspirin  81 mg Oral Daily  . carvedilol  25 mg Oral BID  . enoxaparin (LOVENOX) injection  40 mg Subcutaneous Q24H  . furosemide  40 mg Oral Daily  . levofloxacin (LEVAQUIN) IV  750 mg Intravenous Q24H  . lisinopril  40 mg Oral Daily  . potassium chloride  10 mEq Oral Daily  . sodium chloride  3 mL Intravenous Q12H   Continuous Infusions: . sodium chloride 1,000 mL (10/08/14 0320)

## 2014-10-08 NOTE — Progress Notes (Signed)
CSW received referral for COPD Gold Protocol.  Pt does not meet criteria for COPD Gold Protocol as pt has only had 1 admission in past 6 months.   No social work needs identified at this time.  CSW signing off.   Please re-consult if social work needs arise.   Alison Murray, MSW, [place] Work [phone]

## 2014-10-08 NOTE — Progress Notes (Signed)
Patient continues to decline nocturnal CPAP. Order changed to prn per RT protocol.  

## 2014-10-08 NOTE — Progress Notes (Signed)
Nutrition Brief Note  RD consult received from COPD protocol.  Per pt he weighs 250-260 lb depending on how much edema he has. Pt lives alone and does his own cooking. Pt has made attempts to limit salt in his diet. Pt reports good appetite. We discussed low sodium diet as well as good options for supplements if needed at home.  Pt appreciative of visit but with no needs at this time.   Wt Readings from Last 15 Encounters:  10/08/14 250 lb 11.2 oz (113.717 kg)  09/25/14 259 lb (117.482 kg)  03/29/14 258 lb 4.8 oz (117.164 kg)  12/25/13 259 lb 14.4 oz (117.89 kg)  11/14/13 264 lb (119.75 kg)  10/04/13 260 lb (117.935 kg)  09/26/13 260 lb 14.4 oz (118.343 kg)  03/29/13 263 lb 14.4 oz (119.704 kg)  05/09/12 249 lb (112.946 kg)  11/16/11 236 lb (107.049 kg)  09/15/11 254 lb (115.214 kg)  08/04/11 258 lb (117.028 kg)  03/26/11 259 lb (117.482 kg)  10/10/10 264 lb (119.75 kg)    Body mass index is 30.53 kg/(m^2). Patient meets criteria for obesity class I based on current BMI.   Current diet order is Heart Healthy, patient is consuming approximately 100% of meals at this time. Labs and medications reviewed.   No nutrition interventions warranted at this time. If nutrition issues arise, please consult RD.   [place], [place], [place] Pager [phone] After Hours Pager

## 2014-10-08 NOTE — Evaluation (Signed)
Physical Therapy Evaluation Patient Details Name: Curtis Riley MRN: [id number] DOB: 1934/08/16 Today's Date: 10/08/2014   History of Present Illness  Pt is a 80 year old male admitted for Acute respiratory failure with hypoxia, right lower lobe pneumonia, leukocytosis, acute COPD exacerbation with hx of CVA, sleep apnea, COPD, HTN, arthritis    Clinical Impression  Pt admitted with above diagnosis. Pt currently with functional limitations due to the deficits listed below (see PT Problem List).  Pt will benefit from skilled PT to increase their independence and safety with mobility to allow discharge to the venue listed below.  Pt mildly unsteady with ambulation however feels more comfortable using RW.  Pt states he typically wears oxygen as needed at home, also uses CPAP.  Pt with SpO2 89% room air during ambulation and reapplied O2 [place] upon return to bed.  Pt agreeable to use RW at home for safety and reports daughter can check in on him at home since he lives alone.     Follow Up Recommendations Home health PT;Supervision - Intermittent    Equipment Recommendations  Rolling walker with 5" wheels    Recommendations for Other Services       Precautions / Restrictions Precautions Precautions: Fall Precaution Comments: monitor sats      Mobility  Bed Mobility Overal bed mobility: Modified Independent                Transfers Overall transfer level: Needs assistance Equipment used: Rolling walker (2 wheeled) Transfers: Sit to/from Stand Sit to Stand: Min guard         General transfer comment: verbal cues for safe technique  Ambulation/Gait Ambulation/Gait assistance: Min assist Ambulation Distance (Feet): 100 Feet Assistive device: Rolling walker (2 wheeled) Gait Pattern/deviations: Step-through pattern;Trunk flexed     General Gait Details: one LOB requiring assist due to distraction and pt crossed over LEs (narrow BOS), pt feels more steady with RW and  agreeable to use upon d/c  Stairs            Wheelchair Mobility    Modified Rankin (Stroke Patients Only)       Balance Overall balance assessment:  (reports no hx of falls)                                           Pertinent Vitals/Pain Pain Assessment: No/denies pain    Home Living Family/patient expects to be discharged to:: Private residence Living Arrangements: Alone     Home Access: Stairs to enter   Technical brewer of Steps:  2 Home Layout: One level Home Equipment: None      Prior Function Level of Independence: Independent         Comments: reports short distance ambulation mostly due to SOB with exertion     Hand Dominance        Extremity/Trunk Assessment               Lower Extremity Assessment: Generalized weakness         Communication   Communication: No difficulties  Cognition Arousal/Alertness: Awake/alert Behavior During Therapy: WFL for tasks assessed/performed Overall Cognitive Status: Within Functional Limits for tasks assessed                      General Comments      Exercises        Assessment/Plan  PT Assessment Patient needs continued PT services  PT Diagnosis Difficulty walking   PT Problem List Decreased strength;Decreased activity tolerance;Decreased mobility;Decreased balance;Decreased knowledge of use of DME;Cardiopulmonary status limiting activity  PT Treatment Interventions DME instruction;Gait training;Stair training;Patient/family education;Functional mobility training;Therapeutic activities;Therapeutic exercise   PT Goals (Current goals can be found in the Care Plan section) Acute Rehab PT Goals PT Goal Formulation: With patient Time For Goal Achievement: 10/22/14 Potential to Achieve Goals: Good    Frequency Min 3X/week   Barriers to discharge        Co-evaluation               End of Session   Activity Tolerance: Patient tolerated  treatment well Patient left: in bed;with call bell/phone within reach           Time: 1524-1536 PT Time Calculation (min) (ACUTE ONLY): 12 min   Charges:   PT Evaluation $Initial PT Evaluation Tier I: 1 Procedure     PT G Codes:        Willie Loy,KATHrine E 10/08/2014, 4:08 PM Carmelia Bake, PT, DPT 10/08/2014 Pager: [phone]

## 2014-10-08 NOTE — Care Management Note (Signed)
    Page 1 of 1   10/08/2014     4:15:21 PM CARE MANAGEMENT NOTE 10/08/2014  Patient:  Curtis Riley, Curtis Riley   Account Number:  [id number]  Date Initiated:  10/08/2014  Documentation initiated by:  Dessa Phi  Subjective/Objective Assessment:   80 y/o m admitted w/PNA.KV:QQVZ.     Action/Plan:   From home.COPD gold.   Anticipated DC Date:  10/12/2014   Anticipated DC Plan:  [place]  CM consult      Choice offered to / List presented to:             Status of service:  In process, will continue to follow Medicare Important Message given?   (If response is "NO", the following Medicare IM given date fields will be blank) Date Medicare IM given:   Medicare IM given by:   Date Additional Medicare IM given:   Additional Medicare IM given by:    Discharge Disposition:    Per UR Regulation:  Reviewed for med. necessity/level of care/duration of stay  If discussed at [place] of Stay Meetings, dates discussed:    Comments:  10/08/14 Dessa Phi RN BSN NCM [phone] PT-HHC.Will provide HHPT list in am.Await fianl HHPT order.

## 2014-10-09 DIAGNOSIS — J449 Chronic obstructive pulmonary disease, unspecified: Secondary | ICD-10-CM | POA: Diagnosis present

## 2014-10-09 DIAGNOSIS — D72829 Elevated white blood cell count, unspecified: Secondary | ICD-10-CM | POA: Diagnosis present

## 2014-10-09 DIAGNOSIS — I5032 Chronic diastolic (congestive) heart failure: Secondary | ICD-10-CM

## 2014-10-09 MED ORDER — IBUPROFEN 400 MG PO TABS
400.0000 mg | ORAL_TABLET | Freq: Once | ORAL | Status: AC
  Administered 2014-10-09: 400 mg via ORAL
  Filled 2014-10-09: qty 1

## 2014-10-09 MED ORDER — IBUPROFEN 200 MG PO TABS
200.0000 mg | ORAL_TABLET | ORAL | Status: DC | PRN
  Administered 2014-10-10: 200 mg via ORAL
  Filled 2014-10-09: qty 1

## 2014-10-09 MED ORDER — IBUPROFEN 400 MG PO TABS
400.0000 mg | ORAL_TABLET | Freq: Once | ORAL | Status: AC
  Administered 2014-10-09: 400 mg via ORAL
  Filled 2014-10-09 (×2): qty 1

## 2014-10-09 NOTE — Progress Notes (Signed)
Pt refused CPAP QHS.  RT will continue monitor and assess as needed.

## 2014-10-09 NOTE — Progress Notes (Addendum)
Patient ID: Curtis Riley, male   DOB: Dec 07, 1934, 80 y.o.   MRN: [id number] [facility] PROGRESS NOTE  ORDELL PRICHETT BWG:[id number] DOB: [birth year] DOA: 10/07/2014 PCP: Wyatt Haste, MD  Brief narrative:    80 year old male with past medical history of COPD, using oxygen at home at nighttime, history of hypertension, diastolic CHF who presented to [facility] hospital with progressively worsening shortness of breath especially with exertion, higher oxygen requirements, fevers and chills. On admission, patient was found to have right lower lobe pneumonia based on chest x-ray. He was started on Levaquin.  Assessment/Plan:    Principal problem: Acute respiratory failure with hypoxia / right lower lobe pneumonia/ leukocytosis / acute COPD exacerbation - Respiratory status little better this am. Continue oxygen support via [place] to keep O2 sats above 90%. - Continue Levaquin which was started on admission. - Influenza panel is negative.  Active problems: Chronic diastolic congestive heart failure - Compensated. BNP mildly elevated at 114. - Last 2-D echo on file in March 2016 with grade 1 diastolic dysfunction, they could not adequately assess LV at that time. - Continue carvedilol 25 mg twice daily, Lasix 40 mg daily, lisinopril 40 mg daily. - Continue aspirin daily  Essential hypertension - Continue carvedilol, Lasix and lisinopril. - Replete electrolytes as needed.  Coronary artery disease - Stable - Continue aspirin    DVT Prophylaxis  - Lovenox subcutaneous ordered   Code Status: Full.  Family Communication:  plan of care discussed with the patient Disposition Plan: Home in next 24 hours. He felt little too weak to go today. PT evaluation pending.   IV access:  Peripheral IV  Procedures and diagnostic studies:    Dg Chest 2 View 10/07/2014  Right lower lobe pneumonia. Followup films following appropriate therapy are recommended.     Medical Consultants:   None  Other Consultants:  None  IAnti-Infectives:   Levaquin 10/07/2014 -->   Leisa Lenz, MD  [facility] Pager [phone]  If 7PM-7AM, please contact night-coverage [email or web address] Password [facility] 10/09/2014, 2:45 PM   LOS: 2 days    HPI/Subjective: No acute overnight events.  Objective: Filed Vitals:   10/08/14 2104 10/09/14 0536 10/09/14 0856 10/09/14 1330  BP: 145/88 155/91  141/79  Pulse: 75 80  79  Temp: 98.9 F (37.2 C) 98.1 F (36.7 C)  98.1 F (36.7 C)  TempSrc: Oral Oral  Oral  Resp: 18 20  18   Height:      Weight:      SpO2: 99% 97% 95% 95%    Intake/Output Summary (Last 24 hours) at 10/09/14 1445 Last data filed at 10/09/14 0900  Gross per 24 hour  Intake    240 ml  Output   1050 ml  Net   -810 ml    Exam:   General:  Pt is not in distress  Cardiovascular: RRR, (+) S1,S2  Respiratory: bilateral air entry, no wheezing   Abdomen: (+) BS, non tender, non distended abdomen   Extremities: No LE swelling, pulses palpable   Neuro: Nonfocal  Data Reviewed: Basic Metabolic Panel:  Recent Labs Lab 10/07/14 0849 10/08/14 0416  NA 140 140  K 3.5 3.7  CL 104 106  CO2 26 26  GLUCOSE 120* 115*  BUN 14 24*  CREATININE 1.10 1.13  CALCIUM 8.9 8.6   Liver Function Tests:  Recent Labs Lab 10/07/14 0849  AST 31  ALT 31  ALKPHOS 73  BILITOT 2.1*  PROT 7.1  ALBUMIN 2.9*  No results for input(s): LIPASE, AMYLASE in the last 168 hours. No results for input(s): AMMONIA in the last 168 hours. CBC:  Recent Labs Lab 10/07/14 0849 10/08/14 0416  WBC 11.4* 10.1  NEUTROABS 8.0*  --   HGB 12.3* 11.6*  HCT 38.3* 36.6*  MCV 95.5 96.3  PLT 240 244   Cardiac Enzymes: No results for input(s): CKTOTAL, CKMB, CKMBINDEX, TROPONINI in the last 168 hours. BNP: Invalid input(s): POCBNP CBG: No results for input(s): GLUCAP in the last 168 hours.  No results found for this or any previous visit (from the past 240 hour(s)).    Scheduled Meds: . antiseptic oral rinse  7 mL Mouth Rinse BID  . aspirin  81 mg Oral Daily  . carvedilol  25 mg Oral BID  . enoxaparin (LOVENOX) injection  40 mg Subcutaneous Q24H  . furosemide  40 mg Oral Daily  . ibuprofen  400 mg Oral Once  . levofloxacin (LEVAQUIN) IV  750 mg Intravenous Q24H  . lisinopril  40 mg Oral Daily  . potassium chloride  10 mEq Oral Daily  . sodium chloride  3 mL Intravenous Q12H   Continuous Infusions: . sodium chloride 1,000 mL (10/09/14 0606)

## 2014-10-09 NOTE — Evaluation (Signed)
Occupational Therapy Evaluation Patient Details Name: Curtis Riley MRN: [id number] DOB: [birth year] Today's Date: 10/09/2014    History of Present Illness Pt is a 80 year old male admitted for Acute respiratory failure with hypoxia, right lower lobe pneumonia, leukocytosis, acute COPD exacerbation with hx of CVA, sleep apnea, COPD, HTN, arthritis   Clinical Impression   Pt was admitted for the above. At baseline, he is mod I, using 02 prn.  Will follow in acute setting to increase safety and independence with adls.  Goals are for mod I level. He currently needs set up and min guard for sit to stand as well as ambulating.      Follow Up Recommendations  No OT follow up    Equipment Recommendations  None recommended by OT    Recommendations for Other Services       Precautions / Restrictions Precautions Precautions: Fall Precaution Comments: monitor sats Restrictions Weight Bearing Restrictions: No      Mobility Bed Mobility Overal bed mobility: Modified Independent                Transfers Overall transfer level: Needs assistance Equipment used: Rolling walker (2 wheeled) Transfers: Sit to/from Stand Sit to Stand: Min guard         General transfer comment: no device, min guard for safety    Balance                                            ADL Overall ADL's : Needs assistance/impaired             Lower Body Bathing: Supervison/ safety;Sit to/from stand (with grab bar)       Lower Body Dressing: Min guard;Sit to/from stand                 General ADL Comments: pt took at shower: he was able to complete task, seated with set up and min guard to ambulate to bathroom. Pt stands to don underwear:  Recommended he sit for increased safety and for energy conservation.  He did take a few rest breaks without prompting.   Reviewed energy conservation.  Pt reports that he is used to working within his tolerance.  He uses 02 at  home as needed.  Sats 96-97% on 2 liters:  Dyspnea 2/4.  Cued for pursed lip breathing to recover breath.      Vision     Perception     Praxis      Pertinent Vitals/Pain Pain Assessment: No/denies pain     Hand Dominance     Extremity/Trunk Assessment Upper Extremity Assessment Upper Extremity Assessment: Overall WFL for tasks assessed           Communication Communication Communication: No difficulties   Cognition Arousal/Alertness: Awake/alert Behavior During Therapy: WFL for tasks assessed/performed Overall Cognitive Status: Within Functional Limits for tasks assessed                     General Comments       Exercises       Shoulder Instructions      Home Living Family/patient expects to be discharged to:: Private residence Living Arrangements: Alone                 Bathroom Shower/Tub: Tub/shower unit Shower/tub characteristics: Architectural technologist: Handicapped height     Home Equipment:  Shower seat   Additional Comments: daughter lives a couple of doors down:  she works 2nd shift.  She can get groceries, etc.      Prior Functioning/Environment Level of Independence: Independent        Comments: reports short distance ambulation mostly due to SOB with exertion.  drives, gets own groceries, dons compression stockings    OT Diagnosis: Generalized weakness   OT Problem List: Decreased activity tolerance;Cardiopulmonary status limiting activity   OT Treatment/Interventions: Energy conservation;DME and/or AE instruction;Patient/family education;Self-care/ADL training    OT Goals(Current goals can be found in the care plan section) Acute Rehab OT Goals Patient Stated Goal: get back to baseline OT Goal Formulation: With patient Time For Goal Achievement: 10/16/14 Potential to Achieve Goals: Good ADL Goals Pt Will Transfer to Toilet: with modified independence;ambulating (high commode) Additional ADL Goal #1: pt will gather  clothes at mod I level and complete ADL  OT Frequency: Min 2X/week   Barriers to D/C:            Co-evaluation              End of Session    Activity Tolerance: Patient tolerated treatment well Patient left: with call bell/phone within reach;in bed   Time: 2536-6440 OT Time Calculation (min): 42 min Charges:  OT General Charges $OT Visit: 1 Procedure OT Evaluation $Initial OT Evaluation Tier I: 1 Procedure OT Treatments $Self Care/Home Management : 8-22 mins G-Codes:    [name] 10-31-2014, 3:57 PM  Lesle Chris, OTR/L [phone] 10/31/2014

## 2014-10-09 NOTE — Care Management Note (Signed)
CARE MANAGEMENT NOTE 10/09/2014  Patient:  Curtis Riley, Curtis Riley   Account Number:  [id number]  Date Initiated:  10/08/2014  Documentation initiated by:  Dessa Phi  Subjective/Objective Assessment:   80 y/o m admitted w/PNA.MB:TDHR.     Action/Plan:   From home.COPD gold.   Anticipated DC Date:  10/10/2014   Anticipated DC Plan:  [place]  CM consult      [facility] Choice  HOME HEALTH   Choice offered to / List presented to:  C-1 Patient        [place] arranged  HH-1 RN  [place].   Status of service:  In process, will continue to follow Medicare Important Message given?   (If response is "NO", the following Medicare IM given date fields will be blank) Date Medicare IM given:   Medicare IM given by:   Date Additional Medicare IM given:   Additional Medicare IM given by:    Discharge Disposition:    Per UR Regulation:  Reviewed for med. necessity/level of care/duration of stay  If discussed at [place] of Stay Meetings, dates discussed:    Comments:  10/09/14 Marney Doctor RN,BSN,NCM [phone] Pt offered choice for [facility] services. Pt chose [facility]. Referral called to [facility] rep. PT is also recommending RW. Pt has a cane at home that he uses currently.  Will need MD order for RN/PT/OT/Aide and RW. Pt also asking about a home nebulizer. Pt encouraged to speak to MD about home neb machine. Will await final orders. Pt has been labeled COPD Gold by MD. Per EPIC pt has had no previous admissions for COPD so not a true COPD GOLD.  10/08/14 Dessa Phi RN BSN NCM [phone] PT-HHC.Will provide HHPT list in am.Await fianl HHPT order.

## 2014-10-09 NOTE — Progress Notes (Signed)
Physical Therapy Treatment Patient Details Name: Curtis Riley MRN: [id number] DOB: 01/08/1935 Today's Date: 10/09/2014    History of Present Illness Pt is a 80 year old male admitted for Acute respiratory failure with hypoxia, right lower lobe pneumonia, leukocytosis, acute COPD exacerbation with hx of CVA, sleep apnea, COPD, HTN, arthritis    PT Comments    Pt able to progress ambulating in hallway today however required supplemental oxygen.  Pt states he typically wears O2 as needed at home.   SATURATION QUALIFICATIONS: (This note is used to comply with regulatory documentation for home oxygen)  Patient Saturations on Room Air at Rest = 92%  Patient Saturations on Room Air while Ambulating = 85%  Patient Saturations on 2 Liters of oxygen while Ambulating = 93%  Please briefly explain why patient needs home oxygen: to maintain saturations above 88% during functional activities such as ambulation   Follow Up Recommendations  Home health PT;Supervision - Intermittent     Equipment Recommendations  Rolling walker with 5" wheels    Recommendations for Other Services       Precautions / Restrictions Precautions Precautions: Fall Precaution Comments: monitor sats    Mobility  Bed Mobility Overal bed mobility: Modified Independent                Transfers Overall transfer level: Needs assistance Equipment used: Rolling walker (2 wheeled) Transfers: Sit to/from Stand Sit to Stand: Min guard         General transfer comment: verbal cues for safe technique  Ambulation/Gait Ambulation/Gait assistance: Min guard Ambulation Distance (Feet): 160 Feet Assistive device: Rolling walker (2 wheeled) Gait Pattern/deviations: Step-through pattern;Decreased stride length     General Gait Details: no LOB observed today however pt reports feeling a little unsteady, required 2L O2 [place] due to SpO2 dropping to 85% room air during gait   Stairs             Wheelchair Mobility    Modified Rankin (Stroke Patients Only)       Balance                                    Cognition Arousal/Alertness: Awake/alert Behavior During Therapy: WFL for tasks assessed/performed Overall Cognitive Status: Within Functional Limits for tasks assessed                      Exercises      General Comments        Pertinent Vitals/Pain Pain Assessment: No/denies pain    Home Living                      Prior Function            PT Goals (current goals can now be found in the care plan section) Progress towards PT goals: Progressing toward goals    Frequency  Min 3X/week    PT Plan Current plan remains appropriate    Co-evaluation             End of Session Equipment Utilized During Treatment: Oxygen Activity Tolerance: Patient tolerated treatment well Patient left: in chair;with call bell/phone within reach     Time: 1025-1040 PT Time Calculation (min) (ACUTE ONLY): 15 min  Charges:  $Gait Training: 8-22 mins                    G  CodesTrena Platt 10-17-2014, 12:36 PM Carmelia Bake, PT, DPT 10-17-14 Pager: [phone]

## 2014-10-10 DIAGNOSIS — J9611 Chronic respiratory failure with hypoxia: Secondary | ICD-10-CM

## 2014-10-10 DIAGNOSIS — J189 Pneumonia, unspecified organism: Secondary | ICD-10-CM | POA: Diagnosis not present

## 2014-10-10 MED ORDER — IPRATROPIUM-ALBUTEROL 0.5-2.5 (3) MG/3ML IN SOLN: 3.0000 mL | RESPIRATORY_TRACT | Status: DC | PRN

## 2014-10-10 MED ORDER — PNEUMOCOCCAL VAC POLYVALENT 25 MCG/0.5ML IJ INJ
0.5000 mL | INJECTION | INTRAMUSCULAR | Status: AC
  Administered 2014-10-10: 0.5 mL via INTRAMUSCULAR
  Filled 2014-10-10: qty 0.5

## 2014-10-10 MED ORDER — LEVOFLOXACIN 500 MG PO TABS: 500.0000 mg | ORAL_TABLET | Freq: Every day | ORAL | Status: DC

## 2014-10-10 NOTE — Care Management Note (Signed)
CARE MANAGEMENT NOTE 10/10/2014  Patient:  Curtis Riley, Curtis Riley   Account Number:  [id number]  Date Initiated:  10/08/2014  Documentation initiated by:  Dessa Phi  Subjective/Objective Assessment:   80 y/o m admitted w/PNA.UU:EKCM.     Action/Plan:   From home.COPD gold.   Anticipated DC Date:  10/10/2014   Anticipated DC Plan:  [place]  CM consult      [facility] Choice  HOME HEALTH   Choice offered to / List presented to:  C-1 Patient   DME arranged  [place]      DME agency  [place] arranged  HH-1 RN  [place] agency  [place].   Status of service:  In process, will continue to follow Medicare Important Message given?  YES (If response is "NO", the following Medicare IM given date fields will be blank) Date Medicare IM given:  10/10/2014 Medicare IM given by:  Marney Doctor Date Additional Medicare IM given:   Additional Medicare IM given by:    Discharge Disposition:    Per UR Regulation:  Reviewed for med. necessity/level of care/duration of stay  If discussed at [place] of Stay Meetings, dates discussed:    Comments:  10/10/14 Marney Doctor RN,BSN,NCM Pt was on home 02 prn prior to admission. Pt receives oxygen from [place].  [employer] called to confirm they could supply pt with a neb machine today as well. Orders for neb machine and RW faxed to [place] and pt informed that they would be contacting him about delivering equipment to his home today. Pts daughter to pick pt up and [facility] will be providing his [facility] services. No additional CM needs noted for DC.  10/09/14 Marney Doctor RN,BSN,NCM [phone] Pt offered choice for [facility] services. Pt chose [facility]. Referral called to [facility] rep. PT is also recommending RW. Pt has a cane at home that he uses currently.  Will need MD order for RN/PT/OT/Aide and RW. Pt also asking about a home nebulizer. Pt  encouraged to speak to MD about home neb machine. Will await final orders. Pt has been labeled COPD Gold by MD. Per EPIC pt has had no previous admissions for COPD so not a true COPD GOLD.  10/08/14 Dessa Phi RN BSN NCM [phone] PT-HHC.Will provide HHPT list in am.Await fianl HHPT order.

## 2014-10-10 NOTE — Discharge Instructions (Signed)

## 2014-10-10 NOTE — Progress Notes (Signed)
Patient discharged home, all discharge medications and instructions reviewed and questions answered.  Patient assisted to vehicle by wheelchair. 

## 2014-10-10 NOTE — Discharge Summary (Signed)
Physician Discharge Summary  Curtis Riley BDZ:[id number] DOB: [birth year] DOA: 10/07/2014  PCP: Wyatt Haste, MD  Admit date: 10/07/2014 Discharge date: 10/10/2014  Recommendations for Outpatient Follow-up:  1. Continue Levaquin for 1 week on discharge. Follow-up with primary care physician per scheduled appointment.  Discharge Diagnoses:  Principal Problem:   Chronic respiratory failure with hypoxia Active Problems:   Hypertension   Coronary artery spasm   CAP (community acquired pneumonia)   COPD exacerbation   Leukocytosis    Discharge Condition: stable   Diet recommendation: as tolerated   History of present illness:  80 year old male with past medical history of COPD, using oxygen at home at nighttime, history of hypertension, diastolic CHF who presented to [facility] long hospital with progressively worsening shortness of breath especially with exertion, higher oxygen requirements, fevers and chills. On admission, patient was found to have right lower lobe pneumonia based on chest x-ray. He was started on Levaquin.  Assessment/Plan:    Principal problem: Acute respiratory failure with hypoxia / right lower lobe pneumonia/ leukocytosis / acute COPD exacerbation - Respiratory status remains stable. - Nebulizer machine order placed. Home health orders placed. - Continue nebulizer treatment and inhaler treatments as prescribed. - Patient was on Levaquin from the time of the admission. We will continue Levaquin for 1 more week on discharge. - Influenza panel is negative.  Active problems: Chronic diastolic congestive heart failure - Compensated.  - BNP mildly elevated at 114. - Last 2-D echo on file in March 2016 with grade 1 diastolic dysfunction, they could not adequately assess LV at that time. - Continue carvedilol 25 mg twice daily, Lasix 40 mg daily, lisinopril 40 mg daily. - Continue aspirin daily on discharge   Essential hypertension - Continue  carvedilol, Lasix and lisinopril.  Coronary artery disease - Stable - Continue aspirin    DVT Prophylaxis  - Lovenox subcutaneous ordered while patient in hospital.   Code Status: Full.  Family Communication: plan of care discussed with the patient   IV access:  Peripheral IV  Procedures and diagnostic studies:   Dg Chest 2 View 10/07/2014 Right lower lobe pneumonia. Followup films following appropriate therapy are recommended.   Medical Consultants:  None  Other Consultants:  None  IAnti-Infectives:   Levaquin 10/07/2014 --> for 1 week on discharge     Signed:  Leisa Lenz, MD  [facility] 10/10/2014, 10:06 AM  Pager #: [phone]   Discharge Exam: Filed Vitals:   10/10/14 0501  BP: 136/84  Pulse: 68  Temp: 97.8 F (36.6 C)  Resp: 18   Filed Vitals:   10/09/14 2114 10/10/14 0501 10/10/14 0515 10/10/14 0743  BP: 154/91 136/84    Pulse: 81 68    Temp: 98.3 F (36.8 C) 97.8 F (36.6 C)    TempSrc: Oral Oral    Resp: 18 18    Height:      Weight:   116.892 kg (257 lb 11.2 oz)   SpO2: 97% 97%  98%    General: Pt is alert, follows commands appropriately, not in acute distress Cardiovascular: Regular rate and rhythm, S1/S2 (+) Respiratory: Clear to auscultation bilaterally, no wheezing, no crackles, no rhonchi Abdominal: Soft, non tender, non distended, bowel sounds +, no guarding Extremities: no edema, no cyanosis, pulses palpable bilaterally DP and PT Neuro: Grossly nonfocal  Discharge Instructions  Discharge Instructions    Call MD for:  difficulty breathing, headache or visual disturbances    Complete by:  As directed  Call MD for:  persistant nausea and vomiting    Complete by:  As directed      Call MD for:  severe uncontrolled pain    Complete by:  As directed      Diet - low sodium heart healthy    Complete by:  As directed      Discharge instructions    Complete by:  As directed   1. Please take  Levaquin for 7 days on discharge for treatment of pneumonia.     Increase activity slowly    Complete by:  As directed             Medication List    TAKE these medications        aspirin 81 MG tablet  Take 81 mg by mouth daily.     carvedilol 25 MG tablet  Commonly known as:  COREG  Take 1 tablet (25 mg total) by mouth 2 (two) times daily.     furosemide 40 MG tablet  Commonly known as:  LASIX  Take 1 tablet (40 mg total) by mouth daily.     GREEN TEA PO  Take 1 tablet by mouth daily.     ibuprofen 200 MG tablet  Commonly known as:  ADVIL,MOTRIN  Take 200-400 mg by mouth every 6 (six) hours as needed for mild pain.     Ipratropium-Albuterol 20-100 MCG/ACT Aers respimat  Commonly known as:  COMBIVENT RESPIMAT  INHALE 1 PUFF INTO THE LUNGS EVERY 6 HOURS     ipratropium-albuterol 0.5-2.5 (3) MG/3ML Soln  Commonly known as:  DUONEB  Take 3 mLs by nebulization every 4 (four) hours as needed.     levofloxacin 500 MG tablet  Commonly known as:  LEVAQUIN  Take 1 tablet (500 mg total) by mouth daily.     lisinopril 40 MG tablet  Commonly known as:  PRINIVIL,ZESTRIL  Take 1 tablet (40 mg total) by mouth daily.     nitroGLYCERIN 0.4 MG SL tablet  Commonly known as:  NITROSTAT  Place 1 tablet (0.4 mg total) under the tongue every 5 (five) minutes as needed for chest pain.     NON FORMULARY  at bedtime. CPAP     potassium chloride 10 MEQ tablet  Commonly known as:  K-DUR  Take 1 tablet (10 mEq total) by mouth daily.           Follow-up Information    Follow up with Wyatt Haste, MD. Schedule an appointment as soon as possible for a visit in 1 week.   Specialty:  Family Medicine   Why:  Follow up appt after recent hospitalization   Contact information:   [place] [address] [phone]        The results of significant diagnostics from this hospitalization (including imaging, microbiology, ancillary and laboratory) are  listed below for reference.    Significant Diagnostic Studies: Dg Chest 2 View  10/07/2014   CLINICAL DATA:  Shortness of breath for 1 week  EXAM: CHEST  2 VIEW  COMPARISON:  11/14/2013  FINDINGS: Cardiac shadow is within normal limits. The lungs are well aerated bilaterally right lower lobe pneumonia is seen new from the prior exam. No other focal abnormality is noted.  IMPRESSION: Right lower lobe pneumonia. Followup films following appropriate therapy are recommended.   Electronically Signed   By: Inez Catalina M.D.   On: 10/07/2014 08:44    Microbiology: No results found for this or any previous visit (from  the past 240 hour(s)).   Labs: Basic Metabolic Panel:  Recent Labs Lab 10/07/14 0849 10/08/14 0416  NA 140 140  K 3.5 3.7  CL 104 106  CO2 26 26  GLUCOSE 120* 115*  BUN 14 24*  CREATININE 1.10 1.13  CALCIUM 8.9 8.6   Liver Function Tests:  Recent Labs Lab 10/07/14 0849  AST 31  ALT 31  ALKPHOS 73  BILITOT 2.1*  PROT 7.1  ALBUMIN 2.9*   No results for input(s): LIPASE, AMYLASE in the last 168 hours. No results for input(s): AMMONIA in the last 168 hours. CBC:  Recent Labs Lab 10/07/14 0849 10/08/14 0416  WBC 11.4* 10.1  NEUTROABS 8.0*  --   HGB 12.3* 11.6*  HCT 38.3* 36.6*  MCV 95.5 96.3  PLT 240 244   Cardiac Enzymes: No results for input(s): CKTOTAL, CKMB, CKMBINDEX, TROPONINI in the last 168 hours. BNP: BNP (last 3 results)  Recent Labs  10/07/14 0849  BNP 114.3*    ProBNP (last 3 results) No results for input(s): PROBNP in the last 8760 hours.  CBG: No results for input(s): GLUCAP in the last 168 hours.  Time coordinating discharge: Over 30 minutes

## 2014-10-15 DIAGNOSIS — I5032 Chronic diastolic (congestive) heart failure: Secondary | ICD-10-CM | POA: Diagnosis not present

## 2014-10-15 DIAGNOSIS — J9621 Acute and chronic respiratory failure with hypoxia: Secondary | ICD-10-CM | POA: Diagnosis not present

## 2014-10-15 DIAGNOSIS — I10 Essential (primary) hypertension: Secondary | ICD-10-CM | POA: Diagnosis not present

## 2014-10-15 DIAGNOSIS — I2510 Atherosclerotic heart disease of native coronary artery without angina pectoris: Secondary | ICD-10-CM | POA: Diagnosis not present

## 2014-10-15 DIAGNOSIS — Z9981 Dependence on supplemental oxygen: Secondary | ICD-10-CM | POA: Diagnosis not present

## 2014-10-15 DIAGNOSIS — J189 Pneumonia, unspecified organism: Secondary | ICD-10-CM | POA: Diagnosis not present

## 2014-10-15 DIAGNOSIS — Z720 Tobacco use: Secondary | ICD-10-CM | POA: Diagnosis not present

## 2014-10-15 DIAGNOSIS — J440 Chronic obstructive pulmonary disease with acute lower respiratory infection: Secondary | ICD-10-CM | POA: Diagnosis not present

## 2014-10-17 DIAGNOSIS — I5032 Chronic diastolic (congestive) heart failure: Secondary | ICD-10-CM | POA: Diagnosis not present

## 2014-10-17 DIAGNOSIS — J9621 Acute and chronic respiratory failure with hypoxia: Secondary | ICD-10-CM | POA: Diagnosis not present

## 2014-10-17 DIAGNOSIS — J189 Pneumonia, unspecified organism: Secondary | ICD-10-CM | POA: Diagnosis not present

## 2014-10-17 DIAGNOSIS — J440 Chronic obstructive pulmonary disease with acute lower respiratory infection: Secondary | ICD-10-CM | POA: Diagnosis not present

## 2014-10-17 DIAGNOSIS — I2510 Atherosclerotic heart disease of native coronary artery without angina pectoris: Secondary | ICD-10-CM | POA: Diagnosis not present

## 2014-10-17 DIAGNOSIS — I10 Essential (primary) hypertension: Secondary | ICD-10-CM | POA: Diagnosis not present

## 2014-10-18 ENCOUNTER — Ambulatory Visit (INDEPENDENT_AMBULATORY_CARE_PROVIDER_SITE_OTHER): Payer: Medicare Other | Admitting: Family Medicine

## 2014-10-18 ENCOUNTER — Encounter: Payer: Self-pay | Admitting: Family Medicine

## 2014-10-18 VITALS — BP 126/80 | HR 112 | Wt 252.0 lb

## 2014-10-18 DIAGNOSIS — J9611 Chronic respiratory failure with hypoxia: Secondary | ICD-10-CM

## 2014-10-18 DIAGNOSIS — I10 Essential (primary) hypertension: Secondary | ICD-10-CM | POA: Diagnosis not present

## 2014-10-18 DIAGNOSIS — G4733 Obstructive sleep apnea (adult) (pediatric): Secondary | ICD-10-CM | POA: Diagnosis not present

## 2014-10-18 DIAGNOSIS — Z9989 Dependence on other enabling machines and devices: Secondary | ICD-10-CM

## 2014-10-18 NOTE — Progress Notes (Signed)
   Subjective:    Patient ID: Curtis Riley, male    DOB: [birth year], 80 y.o.   MRN: [id number]  HPI He is here for recheck after recent hospitalization and treatment for CAP. He does have underlying chronic respiratory failure and is on oxygen therapy that he uses with his CPAP. He recently finished his Levaquin. He is having no chest pain, shortness of breath, coughing, fever or chills. He continues on DuoNeb and Combivent. He does use the DuoNeb to nebulizer twice per day and the Combivent twice per day at different times. The inhaler is usually used when he is out of his house.  Review of Systems     Objective:   Physical Exam Alert and in no distress. Tympanic membranes and canals are normal. Pharyngeal area is normal. Neck is supple without adenopathy or thyromegaly. Cardiac exam shows a regular sinus rhythm without murmurs or gallops. Lungs are clear to auscultation.        Assessment & Plan:  Chronic respiratory failure with hypoxia  Essential hypertension  OSA on CPAP  Present medication regimen. Discussed proper use of his inhaler as well as nebulizer. He seems to have a good handle on proper use. Explained that these 2 medications are exactly the same medicine.

## 2014-10-18 NOTE — Patient Instructions (Signed)
Use a combination of the inhaler or the nebulizer a total of 4 times per day

## 2014-10-19 ENCOUNTER — Encounter: Payer: Self-pay | Admitting: Internal Medicine

## 2014-10-19 ENCOUNTER — Ambulatory Visit (INDEPENDENT_AMBULATORY_CARE_PROVIDER_SITE_OTHER): Payer: Medicare Other | Admitting: Internal Medicine

## 2014-10-19 VITALS — BP 116/76 | HR 68 | Ht 76.0 in | Wt 250.4 lb

## 2014-10-19 DIAGNOSIS — J9611 Chronic respiratory failure with hypoxia: Secondary | ICD-10-CM

## 2014-10-19 DIAGNOSIS — I509 Heart failure, unspecified: Secondary | ICD-10-CM | POA: Diagnosis not present

## 2014-10-19 DIAGNOSIS — I10 Essential (primary) hypertension: Secondary | ICD-10-CM | POA: Diagnosis not present

## 2014-10-19 NOTE — Patient Instructions (Signed)
Your physician has requested that you have a limited echocardiogram with contrast. Echocardiography is a painless test that uses sound waves to create images of your heart. It provides your doctor with information about the size and shape of your heart and how well your heart's chambers and valves are working. This procedure takes approximately one hour. There are no restrictions for this procedure.   Your physician recommends that you schedule a follow-up appointment in: 6 months with Dr. Debara Pickett.

## 2014-10-19 NOTE — Progress Notes (Signed)
OFFICE NOTE  Chief Complaint:  Breathing has improved  Primary Care Physician: Wyatt Haste, MD  HPI:  Curtis Riley is a 80 year old gentleman with a history of cervical neuropathy and recent C3-C4 surgery. He also has dyslipidemia, hypertension, sleep apnea, on CPAP, and mild COPD. He underwent catheterization, as you know, in 2012 for chest pain. It was negative except for a small bridging segment in the LAD. When I last saw him he had 2 episodes of chest discomfort and I prescribed some nitroglycerin. He reports that he has not needed to take that in the past 6 months. He has had some increase in mucus and secretions related to his COPD and shortness of breath. He feels that that could be better controlled and is planning to see his primary care provider about that.   This Curtis Riley returns today for followup. He is generally doing well. He denies any chest pain. He has had progressive shortness of breath and recently has been approved for oxygen. He plans to wear that 24/7. He does struggle with some lower extremity edema and wears compression stockings.  I saw Curtis Riley back in the office today. He reports that he's had some progressive shortness of breath again, despite using oxygen and CPAP. He's had some worsening leg swelling, more than typical in the past. He wears compression stockings, however has also noted some weight gain and mild orthopnea. I'm concerned that there may be some overlying right heart failure.  I saw Curtis Riley back in the office today. He reports an improvement in his breathing. There was some signs of mild congestive heart failure, possibly right heart failure. Increase his diuretics seem to have helped although he developed a pneumonia and was hospitalized. Subsequently was discharged on antibiotics which he's completed and a nebulizer. He was ordered have an echocardiogram however the LV was not well visualized due to his COPD, therefore no EF was  provided. I recommended a repeat limited echocardiogram with contrast however that was not yet obtained.  PMHx:  Past Medical History  Diagnosis Date  . BPH (benign prostatic hyperplasia)   . Smoker   . CVA (cerebral infarction)   . Hypertension   . COPD (chronic obstructive pulmonary disease)   . Diverticulosis   . Angina     occasional chest pain  never been treated for this ...   . Shortness of breath     with exertion   . Sleep apnea     uses cpap  . Kidney stones     10-15 yrs ago  . Neuromuscular disorder   . Arthritis   . Dyslipidemia   . Family history of coronary artery disease     brothers, sisters  . History of nuclear stress test 03/2010    dobutamine; mild ischemia in mid inferior and apical inferior regions; ekg negative for ischemia; low risk scan     Past Surgical History  Procedure Laterality Date  . Transurethral resection of prostate  08/1996  . Transurethral resection of prostate      long time ago per pt.  10-15 yrs  . Cardiac catheterization  10/2010    small bridging segment in LAD  . Tonsillectomy  age 23  . Anterior cervical decomp/discectomy fusion  10/06/2011    Procedure: ANTERIOR CERVICAL DECOMPRESSION/DISCECTOMY FUSION 3 LEVELS;  Surgeon: Faythe Ghee, MD;  Location: [facility];  Service: Neurosurgery;  Laterality: Bilateral;  Cervical three-four, four-five, five-six anterior cervical decompression, fusion  . Transthoracic  echocardiogram  2004    normal LV size and function, aortic root dilated    FAMHx:  Family History  Problem Relation Age of Onset  . Arthritis Mother   . Hypertension Mother   . Arthritis Father   . Hypertension Father   . Arthritis Sister   . Diabetes Sister   . Hypertension Sister   . Arthritis Brother   . Hypertension Brother     SOCHx:   reports that he quit smoking about 16 years ago. His smoking use included Cigarettes. He has a 20 pack-year smoking history. He has never used smokeless tobacco. He reports  that he does not drink alcohol or use illicit drugs.  ALLERGIES:  Allergies  Allergen Reactions  . Penicillins Anaphylaxis    ROS: A comprehensive review of systems was negative.  HOME MEDS: Current Outpatient Prescriptions  Medication Sig Dispense Refill  . aspirin 81 MG tablet Take 81 mg by mouth daily.    . carvedilol (COREG) 25 MG tablet Take 1 tablet (25 mg total) by mouth 2 (two) times daily. 180 tablet 3  . furosemide (LASIX) 40 MG tablet Take 1 tablet (40 mg total) by mouth daily. 90 tablet 3  . Green Tea, Camillia sinensis, (GREEN TEA PO) Take 1 tablet by mouth daily.    Marland Kitchen ibuprofen (ADVIL,MOTRIN) 200 MG tablet Take 200-400 mg by mouth every 6 (six) hours as needed for mild pain.    . Ipratropium-Albuterol (COMBIVENT RESPIMAT) 20-100 MCG/ACT AERS respimat INHALE 1 PUFF INTO THE LUNGS EVERY 6 HOURS 3 Inhaler 3  . ipratropium-albuterol (DUONEB) 0.5-2.5 (3) MG/3ML SOLN Take 3 mLs by nebulization every 4 (four) hours as needed. 360 mL 0  . levofloxacin (LEVAQUIN) 500 MG tablet Take 1 tablet (500 mg total) by mouth daily. 7 tablet 0  . lisinopril (PRINIVIL,ZESTRIL) 40 MG tablet Take 1 tablet (40 mg total) by mouth daily. 90 tablet 3  . nitroGLYCERIN (NITROSTAT) 0.4 MG SL tablet Place 1 tablet (0.4 mg total) under the tongue every 5 (five) minutes as needed for chest pain. 25 tablet 4  . NON FORMULARY at bedtime. CPAP    . potassium chloride (K-DUR) 10 MEQ tablet Take 1 tablet (10 mEq total) by mouth daily. 90 tablet 3   No current facility-administered medications for this visit.    LABS/IMAGING: No results found for this or any previous visit (from the past 48 hour(s)). No results found.  VITALS: BP 116/76 mmHg  Pulse 68  Ht 6\' 4"  (1.93 m)  Wt 250 lb 6.4 oz (113.581 kg)  BMI 30.49 kg/m2  EXAM: Deferred  EKG: Deferred  ASSESSMENT: 1. Breathing improved 2. Hypertension-controlled 3. Dyslipidemia-taken off statins (?why) 4. COPD - now on oxygen 5. Intermittent  coronary spasm  PLAN: 1.   Curtis Riley appears to have had some congestive heart failure symptoms. Unfortunately his echo was not of good enough quality to determine LV function. I still recommend a limited study with contrast to evaluate for LV function. I suspect he may have some mild RV dysfunction which led to his symptoms. He unfortunately developed a pneumonia and has responded to antibiotics and nebulizers. His COPD is significant. He seems to have benefited from some diuretics and I would recommend continuing him on Lasix 40 mg daily. I'll contact him with the results of his limited echocardiogram. Plan to see him back in 6 months.  Pixie Casino, MD, [facility] Attending Cardiologist [facility] 10/19/2014, 4:32 PM

## 2014-10-22 DIAGNOSIS — I5032 Chronic diastolic (congestive) heart failure: Secondary | ICD-10-CM | POA: Diagnosis not present

## 2014-10-22 DIAGNOSIS — J440 Chronic obstructive pulmonary disease with acute lower respiratory infection: Secondary | ICD-10-CM | POA: Diagnosis not present

## 2014-10-22 DIAGNOSIS — I10 Essential (primary) hypertension: Secondary | ICD-10-CM | POA: Diagnosis not present

## 2014-10-22 DIAGNOSIS — J9621 Acute and chronic respiratory failure with hypoxia: Secondary | ICD-10-CM | POA: Diagnosis not present

## 2014-10-22 DIAGNOSIS — J189 Pneumonia, unspecified organism: Secondary | ICD-10-CM | POA: Diagnosis not present

## 2014-10-22 DIAGNOSIS — I2510 Atherosclerotic heart disease of native coronary artery without angina pectoris: Secondary | ICD-10-CM | POA: Diagnosis not present

## 2014-10-24 DIAGNOSIS — I10 Essential (primary) hypertension: Secondary | ICD-10-CM | POA: Diagnosis not present

## 2014-10-24 DIAGNOSIS — I2510 Atherosclerotic heart disease of native coronary artery without angina pectoris: Secondary | ICD-10-CM | POA: Diagnosis not present

## 2014-10-24 DIAGNOSIS — I5032 Chronic diastolic (congestive) heart failure: Secondary | ICD-10-CM | POA: Diagnosis not present

## 2014-10-24 DIAGNOSIS — J440 Chronic obstructive pulmonary disease with acute lower respiratory infection: Secondary | ICD-10-CM | POA: Diagnosis not present

## 2014-10-24 DIAGNOSIS — J9621 Acute and chronic respiratory failure with hypoxia: Secondary | ICD-10-CM | POA: Diagnosis not present

## 2014-10-24 DIAGNOSIS — J189 Pneumonia, unspecified organism: Secondary | ICD-10-CM | POA: Diagnosis not present

## 2014-10-25 ENCOUNTER — Telehealth: Payer: Self-pay | Admitting: Internal Medicine

## 2014-10-25 DIAGNOSIS — I10 Essential (primary) hypertension: Secondary | ICD-10-CM | POA: Diagnosis not present

## 2014-10-25 DIAGNOSIS — I5032 Chronic diastolic (congestive) heart failure: Secondary | ICD-10-CM | POA: Diagnosis not present

## 2014-10-25 DIAGNOSIS — J9621 Acute and chronic respiratory failure with hypoxia: Secondary | ICD-10-CM | POA: Diagnosis not present

## 2014-10-25 DIAGNOSIS — J440 Chronic obstructive pulmonary disease with acute lower respiratory infection: Secondary | ICD-10-CM | POA: Diagnosis not present

## 2014-10-25 DIAGNOSIS — J189 Pneumonia, unspecified organism: Secondary | ICD-10-CM | POA: Diagnosis not present

## 2014-10-25 DIAGNOSIS — I2510 Atherosclerotic heart disease of native coronary artery without angina pectoris: Secondary | ICD-10-CM | POA: Diagnosis not present

## 2014-10-25 NOTE — Telephone Encounter (Signed)
C[facility]is Riley from [facility] called stating that they are discharging patient has he is not homebound and goes out driving all over,and refusing visits for home health. He is still doing PT right now

## 2014-11-12 ENCOUNTER — Other Ambulatory Visit: Payer: Self-pay | Admitting: Internal Medicine

## 2014-11-12 ENCOUNTER — Ambulatory Visit (HOSPITAL_COMMUNITY)
Admission: RE | Admit: 2014-11-12 | Discharge: 2014-11-12 | Disposition: A | Discharge status: Home / Self Care | Payer: Medicare Other | Source: Ambulatory Visit | Attending: Cardiovascular Disease | Admitting: Cardiovascular Disease

## 2014-11-12 DIAGNOSIS — I509 Heart failure, unspecified: Secondary | ICD-10-CM

## 2014-11-15 ENCOUNTER — Other Ambulatory Visit: Payer: Self-pay | Admitting: Family Medicine

## 2014-11-16 NOTE — Telephone Encounter (Signed)
Is this okay to refill? 

## 2014-11-20 ENCOUNTER — Ambulatory Visit (HOSPITAL_COMMUNITY)
Admission: RE | Admit: 2014-11-20 | Discharge: 2014-11-20 | Disposition: A | Discharge status: Home / Self Care | Payer: Medicare Other | Source: Ambulatory Visit | Attending: Cardiology | Admitting: Cardiology

## 2014-11-20 ENCOUNTER — Other Ambulatory Visit: Payer: Self-pay | Admitting: Internal Medicine

## 2014-11-20 DIAGNOSIS — I509 Heart failure, unspecified: Secondary | ICD-10-CM | POA: Insufficient documentation

## 2014-11-20 DIAGNOSIS — R0600 Dyspnea, unspecified: Secondary | ICD-10-CM

## 2014-11-20 NOTE — Progress Notes (Signed)
Definity given, Blood Pressure after 141/98.  Curtis Riley, RCS

## 2014-11-21 ENCOUNTER — Encounter: Payer: Self-pay | Admitting: Cardiology

## 2014-11-21 ENCOUNTER — Other Ambulatory Visit: Payer: Self-pay | Admitting: *Deleted

## 2014-11-21 ENCOUNTER — Other Ambulatory Visit (HOSPITAL_COMMUNITY): Payer: Self-pay | Admitting: Cardiology

## 2014-11-21 DIAGNOSIS — I509 Heart failure, unspecified: Secondary | ICD-10-CM

## 2014-11-21 MED ORDER — PERFLUTREN LIPID MICROSPHERE: 2.0000 mL | Freq: Once | INTRAVENOUS | Status: DC

## 2014-11-21 MED ORDER — PERFLUTREN LIPID MICROSPHERE
2.0000 mL | Freq: Once | INTRAVENOUS | Status: AC
  Administered 2014-11-20: 2 mL via INTRAVENOUS

## 2014-11-21 NOTE — Progress Notes (Signed)
This encounter was created in error - please disregard.

## 2014-11-26 ENCOUNTER — Telehealth: Payer: Self-pay | Admitting: Internal Medicine

## 2014-11-26 NOTE — Telephone Encounter (Signed)
Returned call to patient echo results given. 

## 2014-11-26 NOTE — Telephone Encounter (Signed)
Pt says he received a call he thinks it was Curtis Riley thinks it was about his test results.

## 2014-12-20 ENCOUNTER — Telehealth: Payer: Self-pay | Admitting: Family Medicine

## 2014-12-20 MED ORDER — IPRATROPIUM-ALBUTEROL 0.5-2.5 (3) MG/3ML IN SOLN: 3.0000 mL | RESPIRATORY_TRACT | Status: DC | PRN

## 2014-12-20 NOTE — Telephone Encounter (Signed)
Pt came in and stated that when he was hospitalized last he was given a nebulizer. They gave him IPRAT-ALBUT 0.5-3 (2.5) solution. He now needs refills on solution sent to [facility]. Pt can be reached at [phone].

## 2015-01-07 ENCOUNTER — Ambulatory Visit (INDEPENDENT_AMBULATORY_CARE_PROVIDER_SITE_OTHER): Payer: Medicare Other | Admitting: Medical

## 2015-01-07 ENCOUNTER — Encounter: Payer: Self-pay | Admitting: Medical

## 2015-01-07 VITALS — BP 140/90 | HR 78 | Temp 98.2°F | Resp 16 | Wt 258.0 lb

## 2015-01-07 DIAGNOSIS — J029 Acute pharyngitis, unspecified: Secondary | ICD-10-CM

## 2015-01-07 LAB — POCT RAPID STREP A (OFFICE): Rapid Strep A Screen: NEGATIVE

## 2015-01-07 NOTE — Progress Notes (Signed)
Subjective:  Curtis Riley is a 80 y.o. male who presents for evaluation of sore throat.  He has not had a recent close exposure to someone with proven streptococcal pharyngitis.  Associated symptoms include 2 days of sore throat throughout the day.  Was in hospital recently for pneumonia.  No cough, no ear pain, no sinus pain, no fever, no NVD.  Feels not so bad.  Didn't want to take any chances.  Treatment to date: cough suppressants.  No sick contacts.  denies GERD, but does endorse some drainage from time to time from sinuses.  No other aggravating or relieving factors.  No other c/o.  The following portions of the patient's history were reviewed and updated as appropriate: allergies, current medications, past medical history, past social history, past surgical history and problem list.  ROS as in subjective   Objective: Filed Vitals:   01/07/15 1451  BP: 140/90  Pulse: 78  Temp: 98.2 F (36.8 C)  Resp: 16    General appearance: no distress, WD/WN HEENT: normocephalic, conjunctiva/corneas normal, sclerae anicteric, nares patent, no discharge or erythema, pharynx with mild erythema, no exudate.  Oral cavity: MMM, no lesions  Neck: supple, no lymphadenopathy, no thyromegaly Heart: RRR, normal S1, S2, no murmurs Lungs: CTA bilaterally, no wheezes, rhonchi, or rales   Laboratory Strep test done. Results:negative.    Assessment and Plan: Encounter Diagnosis  Name Primary?  . Sore throat Yes   Advised that symptoms and exam suggest a post nasal drainage vs viral etiology.  Discussed symptomatic treatment including benadryl QHS the next week, salt water gargles, warm fluids, rest, hydrate well, can use over-the-counter Tylenol for throat pain, fever, or malaise. If worse or not improving within 2-3 days, call or return.

## 2015-03-06 ENCOUNTER — Telehealth: Payer: Self-pay | Admitting: Family Medicine

## 2015-03-06 NOTE — Telephone Encounter (Signed)
Pt came in and stated that he received a call from [place] [facility]. They stated that it was time for him to be recertified for his breathing equipment. Per pt this is all breathing equipment including sleep apnea. Pt states the person that called him was [place] with [employer] and she can be reached at [phone] ext [phone].

## 2015-03-07 NOTE — Telephone Encounter (Signed)
I have called pt to inform him he needs an appointment left message to please call and make an appointment to discuss the oxygen use so we can fax office note to [facility] per Medicare

## 2015-03-07 NOTE — Telephone Encounter (Signed)
Left message with Lynn Ito to please call me back and let me know what we can do to help pt

## 2015-03-07 NOTE — Telephone Encounter (Signed)
Pt needs office visit per medicare to discus the benefits of oxygen use and needs to be put in office note

## 2015-03-18 ENCOUNTER — Encounter: Payer: Self-pay | Admitting: Family Medicine

## 2015-03-18 ENCOUNTER — Ambulatory Visit (INDEPENDENT_AMBULATORY_CARE_PROVIDER_SITE_OTHER): Payer: Medicare Other | Admitting: Family Medicine

## 2015-03-18 VITALS — BP 136/80 | HR 72 | Ht 76.0 in | Wt 255.0 lb

## 2015-03-18 DIAGNOSIS — J9611 Chronic respiratory failure with hypoxia: Secondary | ICD-10-CM

## 2015-03-18 DIAGNOSIS — I5020 Unspecified systolic (congestive) heart failure: Secondary | ICD-10-CM

## 2015-03-18 DIAGNOSIS — Z9989 Dependence on other enabling machines and devices: Secondary | ICD-10-CM

## 2015-03-18 DIAGNOSIS — G4733 Obstructive sleep apnea (adult) (pediatric): Secondary | ICD-10-CM | POA: Diagnosis not present

## 2015-03-18 NOTE — Progress Notes (Signed)
   Subjective:    Patient ID: Curtis Riley, male    DOB: May 25, 1935, 80 y.o.   MRN: [id number]  HPI He is here for consult. He does have underlying COPD as well as OSA. He presently is on CPAP as well as oxygen mainly at night. He has been on this for several years and doing quite nicely on this. He is using nebulizer treatments to help with his breathing.   Review of Systems     Objective:   Physical Exam Alert and in no distress. His pulse ox did drop down to 92 with an increase in his pulse rate up to 120 with physical activity.       Assessment & Plan:  Chronic respiratory failure with hypoxia  OSA on CPAP  Systolic congestive heart failure, unspecified congestive heart failure chronicity Will get information concerning his CPAP and need for oxygen and fill out appropriate paperwork as he does need to be on oxygen.

## 2015-03-20 ENCOUNTER — Ambulatory Visit (INDEPENDENT_AMBULATORY_CARE_PROVIDER_SITE_OTHER): Payer: Medicare Other | Admitting: Family Medicine

## 2015-03-20 VITALS — BP 120/70 | HR 69 | Temp 97.9°F | Wt 255.0 lb

## 2015-03-20 DIAGNOSIS — J029 Acute pharyngitis, unspecified: Secondary | ICD-10-CM | POA: Diagnosis not present

## 2015-03-20 LAB — POCT RAPID STREP A (OFFICE): Rapid Strep A Screen: NEGATIVE

## 2015-03-20 NOTE — Progress Notes (Signed)
   Subjective:    Patient ID: Curtis Riley, male    DOB: 1934/08/09, 80 y.o.   MRN: [id number]  HPI He has a one-day history of sinus congestion, PND, sore throat and questionable fever. He has an underlying history of COPD and did have previous trouble with pneumonia.  Review of Systems     Objective:   Physical Exam Alert and in no distress. Tympanic membranes and canals are normal. Pharyngeal area is normal. Neck is supple without adenopathy or thyromegaly. Cardiac exam shows a regular sinus rhythm without murmurs or gallops. Lungs are clear to auscultation. Screen is negative      Assessment & Plan:  Sore throat - Plan: Rapid Strep A Explained that this is probably a viral infection. Recommend supportive care. Discussed the fact that the cough may become productive but as long as he does not get worsening fever, shortness of breath, treat his symptoms. If he has questions he is to call.

## 2015-04-11 ENCOUNTER — Telehealth: Payer: Self-pay

## 2015-04-11 NOTE — Telephone Encounter (Signed)
Medical records sent out to [facility] on 04/11/15

## 2015-04-17 ENCOUNTER — Other Ambulatory Visit: Payer: Self-pay | Admitting: Internal Medicine

## 2015-04-17 NOTE — Telephone Encounter (Signed)
Rx(s) sent to pharmacy electronically.  

## 2015-05-09 ENCOUNTER — Other Ambulatory Visit: Payer: Self-pay | Admitting: Internal Medicine

## 2015-05-09 MED ORDER — CARVEDILOL 25 MG PO TABS: 25.0000 mg | ORAL_TABLET | Freq: Two times a day (BID) | ORAL | Status: DC

## 2015-05-09 NOTE — Telephone Encounter (Signed)
Rx(s) sent to pharmacy electronically. Patient was due for 6 month OV with Dr. Debara Pickett - scheduled for 10/27 @ 2:15pm

## 2015-05-09 NOTE — Telephone Encounter (Signed)
°  STAT if patient is at the pharmacy , call can be transferred to refill team.   1. Which medications need to be refilled? Carvedilol-needs a prescription sent  2. Which pharmacy/location is medication to be sent to?[facility]  3. Do they need a 30 day or 90 day supply? 90 and refills

## 2015-05-16 ENCOUNTER — Ambulatory Visit (INDEPENDENT_AMBULATORY_CARE_PROVIDER_SITE_OTHER): Payer: Medicare Other | Admitting: Internal Medicine

## 2015-05-16 ENCOUNTER — Encounter: Payer: Self-pay | Admitting: Internal Medicine

## 2015-05-16 VITALS — BP 144/88 | HR 59 | Ht 76.0 in | Wt 266.1 lb

## 2015-05-16 DIAGNOSIS — I5020 Unspecified systolic (congestive) heart failure: Secondary | ICD-10-CM

## 2015-05-16 DIAGNOSIS — G4733 Obstructive sleep apnea (adult) (pediatric): Secondary | ICD-10-CM

## 2015-05-16 DIAGNOSIS — Z23 Encounter for immunization: Secondary | ICD-10-CM

## 2015-05-16 DIAGNOSIS — I201 Angina pectoris with documented spasm: Secondary | ICD-10-CM | POA: Diagnosis not present

## 2015-05-16 DIAGNOSIS — J9611 Chronic respiratory failure with hypoxia: Secondary | ICD-10-CM

## 2015-05-16 DIAGNOSIS — Z9989 Dependence on other enabling machines and devices: Secondary | ICD-10-CM

## 2015-05-16 MED ORDER — CARVEDILOL 25 MG PO TABS: 25.0000 mg | ORAL_TABLET | Freq: Two times a day (BID) | ORAL | Status: DC

## 2015-05-16 NOTE — Progress Notes (Signed)
OFFICE NOTE  Chief Complaint:   stable dyspnea  Primary Care Physician: Curtis Haste, MD  HPI:  Curtis Riley is a 80 year old gentleman with a history of cervical neuropathy and recent C3-C4 surgery. He also has dyslipidemia, hypertension, sleep apnea, on CPAP, and mild COPD. He underwent catheterization, as you know, in 2012 for chest pain. It was negative except for a small bridging segment in the LAD. When I last saw him he had 2 episodes of chest discomfort and I prescribed some nitroglycerin. He reports that he has not needed to take that in the past 6 months. He has had some increase in mucus and secretions related to his COPD and shortness of breath. He feels that that could be better controlled and is planning to see his primary care provider about that.   [name] returns today for followup. He is generally doing well. He denies any chest pain. He has had progressive shortness of breath and recently has been approved for oxygen. He plans to wear that 24/7. He does struggle with some lower extremity edema and wears compression stockings.  I saw Curtis Riley back in the office today. He reports that he's had some progressive shortness of breath again, despite using oxygen and CPAP. He's had some worsening leg swelling, more than typical in the past. He wears compression stockings, however has also noted some weight gain and mild orthopnea. I'm concerned that there may be some overlying right heart failure.  I saw Curtis Riley back in the office today. He reports an improvement in his breathing. There was some signs of mild congestive heart failure, possibly right heart failure. Increase his diuretics seem to have helped although he developed a pneumonia and was hospitalized. Subsequently was discharged on antibiotics which he's completed and a nebulizer. He was ordered have an echocardiogram however the LV was not well visualized due to his COPD, therefore no EF was  provided. I recommended a repeat limited echocardiogram with contrast however that was not yet obtained.   Curtis Riley returns today for follow-up. He reports no significant changes in his shortness of breath. He recently was admitted with a pneumonia and is recovered from that. Aced occasionally gets short-lived atypical chest pains. He did have chronic catheterization 2012 which showed no obstructive coronary disease.  PMHx:  Past Medical History  Diagnosis Date  . BPH (benign prostatic hyperplasia)   . Smoker   . CVA (cerebral infarction)   . Hypertension   . COPD (chronic obstructive pulmonary disease) (Uncertain)   . Diverticulosis   . Angina     occasional chest pain  never been treated for this ...   . Shortness of breath     with exertion   . Sleep apnea     uses cpap  . Kidney stones     10-15 yrs ago  . Neuromuscular disorder ([place])   . Arthritis   . Dyslipidemia   . Family history of coronary artery disease     brothers, sisters  . History of nuclear stress test 03/2010    dobutamine; mild ischemia in mid inferior and apical inferior regions; ekg negative for ischemia; low risk scan     Past Surgical History  Procedure Laterality Date  . Transurethral resection of prostate  08/1996  . Transurethral resection of prostate      long time ago per pt.  10-15 yrs  . Cardiac catheterization  10/2010    small bridging segment in LAD  .  Tonsillectomy  age 54  . Anterior cervical decomp/discectomy fusion  10/06/2011    Procedure: ANTERIOR CERVICAL DECOMPRESSION/DISCECTOMY FUSION 3 LEVELS;  Surgeon: Faythe Ghee, MD;  Location: [facility];  Service: Neurosurgery;  Laterality: Bilateral;  Cervical three-four, four-five, five-six anterior cervical decompression, fusion  . Transthoracic echocardiogram  2004    normal LV size and function, aortic root dilated    FAMHx:  Family History  Problem Relation Age of Onset  . Arthritis Mother   . Hypertension Mother   . Arthritis  Father   . Hypertension Father   . Arthritis Sister   . Diabetes Sister   . Hypertension Sister   . Arthritis Brother   . Hypertension Brother     SOCHx:   reports that he quit smoking about 16 years ago. His smoking use included Cigarettes. He has a 20 pack-year smoking history. He has never used smokeless tobacco. He reports that he does not drink alcohol or use illicit drugs.  ALLERGIES:  Allergies  Allergen Reactions  . Penicillins Anaphylaxis    ROS: A comprehensive review of systems was negative except for: Respiratory: positive for dyspnea on exertion  HOME MEDS: Current Outpatient Prescriptions  Medication Sig Dispense Refill  . aspirin 81 MG tablet Take 81 mg by mouth daily.    . carvedilol (COREG) 25 MG tablet Take 1 tablet (25 mg total) by mouth 2 (two) times daily. 180 tablet 3  . furosemide (LASIX) 40 MG tablet Take 1 tablet (40 mg total) by mouth daily. 90 tablet 3  . Green Tea, Camillia sinensis, (GREEN TEA PO) Take 1 tablet by mouth daily.    Marland Kitchen ibuprofen (ADVIL,MOTRIN) 200 MG tablet Take 200-400 mg by mouth every 6 (six) hours as needed for mild pain.    . Ipratropium-Albuterol (COMBIVENT IN) Inhale into the lungs.    Marland Kitchen ipratropium-albuterol (DUONEB) 0.5-2.5 (3) MG/3ML SOLN Take 3 mLs by nebulization every 4 (four) hours as needed. 360 mL 3  . lisinopril (PRINIVIL,ZESTRIL) 40 MG tablet Take 1 tablet (40 mg total) by mouth daily. 90 tablet 3  . nitroGLYCERIN (NITROSTAT) 0.4 MG SL tablet Place 1 tablet (0.4 mg total) under the tongue every 5 (five) minutes as needed for chest pain. 25 tablet 4  . NON FORMULARY at bedtime. CPAP    . potassium chloride (K-DUR) 10 MEQ tablet Take 1 tablet (10 mEq total) by mouth daily. 90 tablet 3   No current facility-administered medications for this visit.    LABS/IMAGING: No results found for this or any previous visit (from the past 48 hour(s)). No results found.  VITALS: BP 144/88 mmHg  Pulse 59  Ht 6\' 4"  (1.93 m)  Wt  266 lb 1.6 oz (120.702 kg)  BMI 32.40 kg/m2  EXAM: General appearance: alert and no distress Neck: no carotid bruit and no JVD Lungs: clear to auscultation bilaterally Heart: regular rate and rhythm, S1, S2 normal, no murmur, click, rub or gallop Abdomen: soft, non-tender; bowel sounds normal; no masses,  no organomegaly Extremities: extremities normal, atraumatic, no cyanosis or edema Pulses: 2+ and symmetric Skin: Skin color, texture, turgor normal. No rashes or lesions Neurologic: Grossly normal  EKG:  sinus bradycardia 59  ASSESSMENT: 1. Hypertension-controlled 2. Dyslipidemia 3. COPD - now on oxygen 4. Intermittent coronary spasm  PLAN: 1.   Curtis Riley fairly stable from a heart failure standpoint. He is on Lasix on a daily basis for mostly diastolic symptoms. Shortness of breath is primarily related to COPD  and recent pneumonia. He gets intermittent chest discomfort which last less than 1 minute. His catheterization that showed no significant obstructive coronary disease 4 years ago is still relevant. We'll plan to continue his current medications and see him back in 6 months.  Pixie Casino, MD, [facility] Attending Cardiologist [place] 05/16/2015, 3:18 PM

## 2015-05-16 NOTE — Patient Instructions (Signed)
Your physician wants you to follow-up in: 6 months with Dr. Debara Pickett. You will receive a reminder letter in the mail two months in advance. If you don't receive a letter, please call our office to schedule the follow-up appointment.  If you need a refill on your cardiac medications before your next appointment, please call your pharmacy.

## 2015-08-08 ENCOUNTER — Other Ambulatory Visit: Payer: Self-pay | Admitting: Internal Medicine

## 2015-08-09 NOTE — Telephone Encounter (Signed)
Rx(s) sent to pharmacy electronically.  

## 2015-09-19 ENCOUNTER — Encounter: Payer: Self-pay | Admitting: Family Medicine

## 2015-09-19 ENCOUNTER — Ambulatory Visit (INDEPENDENT_AMBULATORY_CARE_PROVIDER_SITE_OTHER): Payer: Medicare Other | Admitting: Family Medicine

## 2015-09-19 VITALS — BP 122/88 | HR 64 | Wt 261.8 lb

## 2015-09-19 DIAGNOSIS — M545 Low back pain, unspecified: Secondary | ICD-10-CM

## 2015-09-19 MED ORDER — POTASSIUM CHLORIDE ER 10 MEQ PO TBCR: 10.0000 meq | EXTENDED_RELEASE_TABLET | Freq: Every day | ORAL | Status: DC

## 2015-09-19 MED ORDER — FUROSEMIDE 40 MG PO TABS: 40.0000 mg | ORAL_TABLET | Freq: Every day | ORAL | Status: DC

## 2015-09-19 NOTE — Progress Notes (Signed)
   Subjective:    Patient ID: Curtis Riley, male    DOB: 1934-08-18, 81 y.o.   MRN: [id number]  HPI He complains of a two-week history of low back pain. He has a previous history low back pain dating to when he was in the [employer]. Since then he has had intermittent difficulty with pain usually lasting only several days. This was lasted 2 weeks. He's had no numbness, tingling or weakness.   Review of Systems     Objective:   Physical Exam Alert and in no distress. Good motion of his lumbar spine. No palpable tenderness. Normal hip motion. Normal motor, sensory and DTRs.       Assessment & Plan:  Midline low back pain without sciatica recommend heat, 800 mg ibuprofen 3 times per day. May eventually for her back to physical therapy. His Lasix and K Dur was called in

## 2015-09-19 NOTE — Patient Instructions (Signed)
Take 800 mg of ibuprofen 3 times per day. Heat to back for 20 minutes 3 times per day.

## 2015-11-18 ENCOUNTER — Ambulatory Visit: Payer: Medicare Other | Admitting: Internal Medicine

## 2015-12-06 ENCOUNTER — Ambulatory Visit (INDEPENDENT_AMBULATORY_CARE_PROVIDER_SITE_OTHER): Payer: Medicare Other | Admitting: Internal Medicine

## 2015-12-06 ENCOUNTER — Encounter: Payer: Self-pay | Admitting: Internal Medicine

## 2015-12-06 VITALS — BP 136/82 | HR 74 | Ht 76.0 in | Wt 260.0 lb

## 2015-12-06 DIAGNOSIS — H918X1 Other specified hearing loss, right ear: Secondary | ICD-10-CM | POA: Diagnosis not present

## 2015-12-06 DIAGNOSIS — J9611 Chronic respiratory failure with hypoxia: Secondary | ICD-10-CM

## 2015-12-06 DIAGNOSIS — I10 Essential (primary) hypertension: Secondary | ICD-10-CM

## 2015-12-06 DIAGNOSIS — I5030 Unspecified diastolic (congestive) heart failure: Secondary | ICD-10-CM | POA: Diagnosis not present

## 2015-12-06 DIAGNOSIS — H6121 Impacted cerumen, right ear: Secondary | ICD-10-CM

## 2015-12-06 DIAGNOSIS — H6120 Impacted cerumen, unspecified ear: Secondary | ICD-10-CM | POA: Insufficient documentation

## 2015-12-06 NOTE — Patient Instructions (Signed)
Your physician wants you to follow-up in: 6 Months. You will receive a reminder letter in the mail two months in advance. If you don't receive a letter, please call our office to schedule the follow-up appointment.  Debrox Earwax

## 2015-12-06 NOTE — Progress Notes (Signed)
OFFICE NOTE  Chief Complaint:   stable dyspnea  Primary Care Physician: Wyatt Haste, MD  HPI:  ARIEH FRASIER is a 81 year old gentleman with a history of cervical neuropathy and recent C3-C4 surgery. He also has dyslipidemia, hypertension, sleep apnea, on CPAP, and mild COPD. He underwent catheterization, as you know, in 2012 for chest pain. It was negative except for a small bridging segment in the LAD. When I last saw him he had 2 episodes of chest discomfort and I prescribed some nitroglycerin. He reports that he has not needed to take that in the past 6 months. He has had some increase in mucus and secretions related to his COPD and shortness of breath. He feels that that could be better controlled and is planning to see his primary care provider about that.   This [place] returns today for followup. He is generally doing well. He denies any chest pain. He has had progressive shortness of breath and recently has been approved for oxygen. He plans to wear that 24/7. He does struggle with some lower extremity edema and wears compression stockings.  I saw Mr. Kleinhans back in the office today. He reports that he's had some progressive shortness of breath again, despite using oxygen and CPAP. He's had some worsening leg swelling, more than typical in the past. He wears compression stockings, however has also noted some weight gain and mild orthopnea. I'm concerned that there may be some overlying right heart failure.  I saw Mr. Cucco back in the office today. He reports an improvement in his breathing. There was some signs of mild congestive heart failure, possibly right heart failure. Increase his diuretics seem to have helped although he developed a pneumonia and was hospitalized. Subsequently was discharged on antibiotics which he's completed and a nebulizer. He was ordered have an echocardiogram however the LV was not well visualized due to his COPD, therefore no EF was  provided. I recommended a repeat limited echocardiogram with contrast however that was not yet obtained.   Mr. Shockley returns today for follow-up. He reports no significant changes in his shortness of breath. He recently was admitted with a pneumonia and is recovered from that. Aced occasionally gets short-lived atypical chest pains. He did have chronic catheterization 2012 which showed no obstructive coronary disease.  12/06/2015  Mr. Sheldon returns for follow-up. He reports shortness of breath with minimal exertion however relates this to COPD. In fact she's lost about 6 pounds. There is no evidence that he's had any worsening diastolic heart failure. He continues on Lasix 40 mg daily. He's also noted some recent hearing loss in the right ear. Blood pressure is at goal today.  PMHx:  Past Medical History  Diagnosis Date  . BPH (benign prostatic hyperplasia)   . Smoker   . CVA (cerebral infarction)   . Hypertension   . COPD (chronic obstructive pulmonary disease) ([place])   . Diverticulosis   . Angina     occasional chest pain  never been treated for this ...   . Shortness of breath     with exertion   . Sleep apnea     uses cpap  . Kidney stones     10-15 yrs ago  . Neuromuscular disorder ([place])   . Arthritis   . Dyslipidemia   . Family history of coronary artery disease     brothers, sisters  . History of nuclear stress test 03/2010    dobutamine; mild ischemia in mid inferior and  apical inferior regions; ekg negative for ischemia; low risk scan     Past Surgical History  Procedure Laterality Date  . Transurethral resection of prostate  08/1996  . Transurethral resection of prostate      long time ago per pt.  10-15 yrs  . Cardiac catheterization  10/2010    small bridging segment in LAD  . Tonsillectomy  age 44  . Anterior cervical decomp/discectomy fusion  10/06/2011    Procedure: ANTERIOR CERVICAL DECOMPRESSION/DISCECTOMY FUSION 3 LEVELS;  Surgeon: Faythe Ghee, MD;   Location: [facility];  Service: Neurosurgery;  Laterality: Bilateral;  Cervical three-four, four-five, five-six anterior cervical decompression, fusion  . Transthoracic echocardiogram  2004    normal LV size and function, aortic root dilated    FAMHx:  Family History  Problem Relation Age of Onset  . Arthritis Mother   . Hypertension Mother   . Arthritis Father   . Hypertension Father   . Arthritis Sister   . Diabetes Sister   . Hypertension Sister   . Arthritis Brother   . Hypertension Brother     SOCHx:   reports that he quit smoking about 17 years ago. His smoking use included Cigarettes. He has a 20 pack-year smoking history. He has never used smokeless tobacco. He reports that he does not drink alcohol or use illicit drugs.  ALLERGIES:  Allergies  Allergen Reactions  . Penicillins Anaphylaxis    ROS: Pertinent items noted in HPI and remainder of comprehensive ROS otherwise negative.  HOME MEDS: Current Outpatient Prescriptions  Medication Sig Dispense Refill  . aspirin 81 MG tablet Take 81 mg by mouth daily.    . carvedilol (COREG) 25 MG tablet Take 1 tablet (25 mg total) by mouth 2 (two) times daily. 180 tablet 3  . furosemide (LASIX) 40 MG tablet Take 1 tablet (40 mg total) by mouth daily. 90 tablet 3  . Green Tea, Camillia sinensis, (GREEN TEA PO) Take 1 tablet by mouth daily.    Marland Kitchen ibuprofen (ADVIL,MOTRIN) 200 MG tablet Take 200-400 mg by mouth every 6 (six) hours as needed for mild pain.    . Ipratropium-Albuterol (COMBIVENT IN) Inhale into the lungs.    Marland Kitchen ipratropium-albuterol (DUONEB) 0.5-2.5 (3) MG/3ML SOLN Take 3 mLs by nebulization every 4 (four) hours as needed. 360 mL 3  . lisinopril (PRINIVIL,ZESTRIL) 40 MG tablet TAKE 1 TABLET DAILY 90 tablet 2  . nitroGLYCERIN (NITROSTAT) 0.4 MG SL tablet Place 1 tablet (0.4 mg total) under the tongue every 5 (five) minutes as needed for chest pain. 25 tablet 4  . NON FORMULARY at bedtime. CPAP    . potassium chloride  (K-DUR) 10 MEQ tablet Take 1 tablet (10 mEq total) by mouth daily. 90 tablet 3   No current facility-administered medications for this visit.    LABS/IMAGING: No results found for this or any previous visit (from the past 48 hour(s)). No results found.  VITALS: BP 136/82 mmHg  Pulse 74  Ht 6\' 4"  (1.93 m)  Wt 260 lb (117.935 kg)  BMI 31.66 kg/m2  EXAM: General appearance: alert and no distress Neck: no carotid bruit, no JVD and Cerumen impaction in the right ear canal Lungs: clear to auscultation bilaterally Heart: regular rate and rhythm, S1, S2 normal, no murmur, click, rub or gallop Abdomen: soft, non-tender; bowel sounds normal; no masses,  no organomegaly Extremities: extremities normal, atraumatic, no cyanosis or edema Pulses: 2+ and symmetric Skin: Skin color, texture, turgor normal. No rashes or lesions  Neurologic: Grossly normal  EKG: Normal sinus rhythm at 74  ASSESSMENT: 1. Hypertension-controlled 2. Dyslipidemia 3. COPD - now on oxygen 4. Intermittent coronary spasm 5. Right ear canal cerumen impaction  PLAN: 1.   Mr. Heino is doing well without any significant worsening of his shortness of breath. Most of that is related to COPD and seems to be worse in hot weather. His weight is actually lower than it had been previously. He is on Lasix 40 mg daily. He does have a history of vasovagal syncope and I'm hesitant to increase his diuretics any further because it may put him at more risk of a syncopal spell. He asked me to look in his right ear today because he's had some hearing loss and I confirm that there is significant cerumen and impaction. I advised him to use Debrox eardrops and if this is not successful he may need to see his primary care provider or ear nose and throat for power flushing or curettage to remove the wax.  Follow-up with me in 6 months.  Pixie Casino, MD, [facility] Attending Cardiologist [place] 12/06/2015, 5:59  PM

## 2016-01-01 ENCOUNTER — Other Ambulatory Visit: Payer: Self-pay | Admitting: Family Medicine

## 2016-02-04 ENCOUNTER — Other Ambulatory Visit: Payer: Self-pay | Admitting: Family Medicine

## 2016-02-05 NOTE — Telephone Encounter (Signed)
Is this ok to refill?  

## 2016-02-11 ENCOUNTER — Ambulatory Visit (INDEPENDENT_AMBULATORY_CARE_PROVIDER_SITE_OTHER): Payer: Medicare Other | Admitting: Family Medicine

## 2016-02-11 ENCOUNTER — Encounter: Payer: Self-pay | Admitting: Family Medicine

## 2016-02-11 VITALS — BP 130/80 | HR 78 | Wt 262.0 lb

## 2016-02-11 DIAGNOSIS — L821 Other seborrheic keratosis: Secondary | ICD-10-CM

## 2016-02-11 NOTE — Progress Notes (Signed)
   Subjective:    Patient ID: Curtis Riley, male    DOB: Jun 29, 1935, 81 y.o.   MRN: [id number]  HPI He states he has a lesion on his back that he thinks his psoriasis and now starting to cause difficulty with itching.   Review of Systems     Objective:   Physical Exam Fama back does show a 2 x 4 cm oval pigmented raised patch. Several other lesions are also noted on his back.       Assessment & Plan:  Seborrheic keratosis - Plan: Ambulatory referral to Dermatology Since it's quite large, I will refer to dermatology for more definitive care on this.

## 2016-02-20 ENCOUNTER — Encounter: Payer: Self-pay | Admitting: Family Medicine

## 2016-02-20 ENCOUNTER — Ambulatory Visit (INDEPENDENT_AMBULATORY_CARE_PROVIDER_SITE_OTHER): Payer: Medicare Other | Admitting: Family Medicine

## 2016-02-20 VITALS — BP 120/82 | HR 84 | Wt 260.0 lb

## 2016-02-20 DIAGNOSIS — M542 Cervicalgia: Secondary | ICD-10-CM | POA: Diagnosis not present

## 2016-02-20 NOTE — Patient Instructions (Signed)
Heat for 20 minutes 3 times per day and then stretching after that. 4 ibuprofen 3 times per day for the next week.If no improvement then call me and I will refer to physical therapy

## 2016-02-20 NOTE — Progress Notes (Signed)
   Subjective:    Patient ID: Curtis Riley, male    DOB: [birth year], 81 y.o.   MRN: [id number]  HPI He complains of a five-day history of neck stiffness. It started on the left and now is over the entire posterior neck area. He gets worse as the day progresses. He has had no numbness, tingling or weakness. He has been using alcohol rubs on it as well as a small amount of heat. He does take 2 ibuprofen daily for other issues.   Review of Systems     Objective:   Physical Exam No palpable tenderness. Normal strength and reflexes. Decreased Range of motion of the neck is noted.       Assessment & Plan:  Neck pain I will have him start with heat 20 minutes 3 times per day, increase ibuprofen as well as do gentle stretching. If no improvementI will refer to physical therapy.

## 2016-03-04 DIAGNOSIS — L820 Inflamed seborrheic keratosis: Secondary | ICD-10-CM | POA: Diagnosis not present

## 2016-03-04 DIAGNOSIS — L910 Hypertrophic scar: Secondary | ICD-10-CM | POA: Diagnosis not present

## 2016-03-17 ENCOUNTER — Telehealth: Payer: Self-pay | Admitting: Family Medicine

## 2016-03-17 NOTE — Telephone Encounter (Signed)
Pt came in and stated he needs a referral to a ENT. He stated he has discussed ear issue with you at a previous visit. He states that he followed your recommendations but is still having difficulty. He is requesting a referral to a ENT. Pt can be reached at [phone].

## 2016-03-17 NOTE — Telephone Encounter (Signed)
I looked at my record and don't have any comments about his ears find out what he is talking about

## 2016-03-18 ENCOUNTER — Other Ambulatory Visit: Payer: Self-pay

## 2016-03-19 NOTE — Telephone Encounter (Signed)
Left message for pt to call me back 

## 2016-03-19 NOTE — Telephone Encounter (Signed)
Pt states its ear wax he has appointment to come in

## 2016-03-24 ENCOUNTER — Encounter: Payer: Self-pay | Admitting: Family Medicine

## 2016-03-24 ENCOUNTER — Ambulatory Visit (INDEPENDENT_AMBULATORY_CARE_PROVIDER_SITE_OTHER): Payer: Medicare Other | Admitting: Family Medicine

## 2016-03-24 VITALS — BP 124/80 | HR 70 | Wt 267.0 lb

## 2016-03-24 DIAGNOSIS — Z23 Encounter for immunization: Secondary | ICD-10-CM

## 2016-03-24 DIAGNOSIS — H6123 Impacted cerumen, bilateral: Secondary | ICD-10-CM

## 2016-03-24 NOTE — Progress Notes (Signed)
   Subjective:    Patient ID: Curtis Riley, male    DOB: [birth year], 81 y.o.   MRN: [id number]  HPI He is having difficulty hearing. He was recently seen by hearing specialist and told to get his ears cleaned out as they were full cerumen. He is having no earache, fever or pains.   Review of Systems     Objective:   Physical Exam Both canals are filled with cerumen and were easily lavaged. The canals and tympanic membranes appear normal. He does have very narrow canals.       Assessment & Plan:  Cerumen impaction, bilateral  Need for prophylactic vaccination and inoculation against influenza - Plan: Flu vaccine HIGH DOSE PF (Fluzone High dose) Instructed to not use Q-tips.

## 2016-03-30 ENCOUNTER — Ambulatory Visit: Payer: Medicare Other | Admitting: Medical

## 2016-03-31 ENCOUNTER — Ambulatory Visit (INDEPENDENT_AMBULATORY_CARE_PROVIDER_SITE_OTHER): Payer: Medicare Other | Admitting: Family Medicine

## 2016-03-31 ENCOUNTER — Encounter: Payer: Self-pay | Admitting: Family Medicine

## 2016-03-31 VITALS — BP 130/80 | HR 86 | Temp 98.0°F | Wt 266.0 lb

## 2016-03-31 DIAGNOSIS — J9611 Chronic respiratory failure with hypoxia: Secondary | ICD-10-CM | POA: Diagnosis not present

## 2016-03-31 DIAGNOSIS — J069 Acute upper respiratory infection, unspecified: Secondary | ICD-10-CM | POA: Diagnosis not present

## 2016-03-31 MED ORDER — LEVOFLOXACIN 500 MG PO TABS: 500.0000 mg | ORAL_TABLET | Freq: Every day | ORAL | 0 refills | Status: DC

## 2016-03-31 NOTE — Progress Notes (Signed)
   Subjective:    Patient ID: Curtis Riley, male    DOB: Nov 07, 1934, 81 y.o.   MRN: [id number]  HPI He has a five-day history it started with a slight sore throat, chest congestion, rhinorrhea and dry cough and occasional difficulty with breathing. No fever, chills, earache. He continues on Combivent. He did have difficulty with his oxygen concentrator not working through the weekend. It is now fixed and he is doing much better.   Review of Systems     Objective:   Physical Exam Alert and in no distress. Tympanic membranes and canals are normal. Pharyngeal area is normal. Neck is supple without adenopathy or thyromegaly. Cardiac exam shows a regular sinus rhythm without murmurs or gallops. Lungs are clear to auscultation.       Assessment & Plan:  Chronic respiratory failure with hypoxia (HCC)  URI, acute Recommend conservative care for this but will call in an antibiotic. I instructed him to give it several more days and if he turns the corner and gets better, not go to pick this up but if he gets worse after several days, it will be there for him. He is comfortable with that.

## 2016-03-31 NOTE — Patient Instructions (Signed)
Weight a couple of days and if you're not getting any better or you're getting worse than get the prescription

## 2016-04-07 ENCOUNTER — Other Ambulatory Visit: Payer: Self-pay | Admitting: Family Medicine

## 2016-05-17 ENCOUNTER — Other Ambulatory Visit: Payer: Self-pay | Admitting: Internal Medicine

## 2016-05-18 NOTE — Telephone Encounter (Signed)
Rx(s) sent to pharmacy electronically.  

## 2016-06-09 ENCOUNTER — Ambulatory Visit (INDEPENDENT_AMBULATORY_CARE_PROVIDER_SITE_OTHER): Payer: Medicare Other | Admitting: Family Medicine

## 2016-06-09 DIAGNOSIS — M545 Low back pain: Secondary | ICD-10-CM

## 2016-06-09 DIAGNOSIS — G8929 Other chronic pain: Secondary | ICD-10-CM | POA: Diagnosis not present

## 2016-06-09 DIAGNOSIS — M549 Dorsalgia, unspecified: Secondary | ICD-10-CM

## 2016-06-09 NOTE — Progress Notes (Signed)
   Subjective:    Patient ID: Curtis Riley, male    DOB: 08-06-1934, 81 y.o.   MRN: [id number]  HPI He is here for consult concerning continued difficulty with low back pain. He has a long history of difficulty with low back pain and is being followed at the [place] for this. Apparently at some point in the past they did recommend surgery but he was not interested. He does have x-ray changes of multiple degenerative changes in his spine. He did bring information and concerning a back brace. 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   Review of Systems     Objective:   Physical Exam Alert and in no distress otherwise not examined       Assessment & Plan:  Chronic low back pain without sciatica, unspecified back pain laterality - Plan: Ambulatory referral to Physical Therapy I will have physical therapy work with him on a good back rehabilitation program and consider the possibility of using a brace. I did explain that sometimes a brace would be the worst thing he could do for his back. Will also have him discuss his concern about asbestos with his pulmonary doctor.

## 2016-06-18 ENCOUNTER — Ambulatory Visit: Payer: Medicare Other | Attending: Family Medicine | Admitting: Physical Therapy

## 2016-06-18 DIAGNOSIS — M6281 Muscle weakness (generalized): Secondary | ICD-10-CM | POA: Diagnosis not present

## 2016-06-18 DIAGNOSIS — M5440 Lumbago with sciatica, unspecified side: Secondary | ICD-10-CM | POA: Insufficient documentation

## 2016-06-18 DIAGNOSIS — R2681 Unsteadiness on feet: Secondary | ICD-10-CM | POA: Insufficient documentation

## 2016-06-18 DIAGNOSIS — R293 Abnormal posture: Secondary | ICD-10-CM

## 2016-06-18 DIAGNOSIS — G8929 Other chronic pain: Secondary | ICD-10-CM | POA: Diagnosis not present

## 2016-06-18 NOTE — Patient Instructions (Signed)
Double Knee to Chest (Flexion)   Gently pull both knees toward chest. Feel stretch in lower back or buttock area. Breathing deeply, Hold _30 ___ seconds. Repeat _3__ times. Do __1-2__ sessions per day.  Knee to Chest (Flexion)   Pull knee toward chest. Feel stretch in lower back or buttock area. Breathing deeply, Hold _10___ seconds. Repeat with other knee. Repeat __5__ times. Do _1-2___ sessions per day.    Lumbar Rotation: Caudal - Bilateral (Supine)   Feet and knees together, arms outstretched, rock knees left and right, staying within shoulder distance ( man in picture is going too far) ,relaxing muscles of low back. Perform for 1 minute. Relax. Repeat _3___ times per set.  Do __3__ sessions per day.  Posture Tips DO: - stand tall and erect - keep chin tucked in - keep head and shoulders in alignment - check posture regularly in mirror or large window - pull head back against headrest in car seat;  Change your position often.  Sit with lumbar support. DON'T: - slouch or slump while watching TV or reading - sit, stand or lie in one position  for too long;  Sitting is especially hard on the spine so if you sit at a desk/use the computer, then stand up often!   Copyright  VHI. All rights reserved.  Posture - Standing   Good posture is important. Avoid slouching and forward head thrust. Maintain curve in low back and align ears over shoul- ders, hips over ankles.  Pull your belly button in toward your back bone.  Stand with even weight in heels and toes,  Ribs lifted up and chin down. Not military stance,    Copyright  VHI. All rights reserved.  Posture - Sitting   Sit upright, head facing forward. Try using a roll to support lower back. Keep shoulders relaxed, and avoid rounded back. Keep hips level with knees. Avoid crossing legs for long periods.  Sit on sit bones ,not tail bone and watch your computer monitor should be at eye level   Copyright  VHI. All rights  reserved.     Voncille Lo, PT Exercise Expert for the Aging Adult  06/18/16 2:10 PM Phone: [phone] Fax: [phone]

## 2016-06-18 NOTE — Therapy (Signed)
[place], [place], [address] Phone: [phone]   Fax:  [phone]  Physical Therapy Evaluation  Patient Details  Name: Curtis Riley MRN: [id number] Date of Birth: July 07, 1935 Referring Provider: Jill Alexanders MD  Encounter Date: 06/18/2016      PT End of Session - 06/18/16 1456    Visit Number 1   Number of Visits 12   Date for PT Re-Evaluation 07/30/16   Authorization Type Medicare   PT Start Time 0130   PT Stop Time 0220   PT Time Calculation (min) 50 min   Activity Tolerance Patient tolerated treatment well   Behavior During Therapy [facility] for tasks assessed/performed      Past Medical History:  Diagnosis Date  . Angina    occasional chest pain  never been treated for this ...   . Arthritis   . BPH (benign prostatic hyperplasia)   . COPD (chronic obstructive pulmonary disease) ([place])   . CVA (cerebral infarction)   . Diverticulosis   . Dyslipidemia   . Family history of coronary artery disease    brothers, sisters  . History of nuclear stress test 03/2010   dobutamine; mild ischemia in mid inferior and apical inferior regions; ekg negative for ischemia; low risk scan   . Hypertension   . Kidney stones    10-15 yrs ago  . Neuromuscular disorder ([place])   . Shortness of breath    with exertion   . Sleep apnea    uses cpap  . Smoker     Past Surgical History:  Procedure Laterality Date  . ANTERIOR CERVICAL DECOMP/DISCECTOMY FUSION  10/06/2011   Procedure: ANTERIOR CERVICAL DECOMPRESSION/DISCECTOMY FUSION 3 LEVELS;  Surgeon: Faythe Ghee, MD;  Location: [facility];  Service: Neurosurgery;  Laterality: Bilateral;  Cervical three-four, four-five, five-six anterior cervical decompression, fusion  . CARDIAC CATHETERIZATION  10/2010   small bridging segment in LAD  . TONSILLECTOMY  age 82  . TRANSTHORACIC ECHOCARDIOGRAM  2004   normal LV size and function, aortic root dilated  .  TRANSURETHRAL RESECTION OF PROSTATE  08/1996  . TRANSURETHRAL RESECTION OF PROSTATE     long time ago per pt.  10-15 yrs    There were no vitals filed for this visit.       Subjective Assessment - 06/18/16 1338    Subjective I used to smoke and I worked around asbestos. I saw Dr. Redmond School to ask for a back brace and he suggested I come to see you.  I did have therapy for my neck but never my back   Pertinent History COPD, HTN, chronic respiratory failure with hypoxia  ACDF C-3/4,4/5  5/6 in 2013, several TIA's   Limitations Standing;Walking;House hold activities   How long can you sit comfortably? unlimited but I am a little stiff   How long can you stand comfortably? stand with support less than 5 min   How long can you walk comfortably? walk with support with less than 5 min   Diagnostic tests x ray low back   Patient Stated Goals I would like to stand for 30 minutes to do stuff around the house and walk without a cane   Currently in Pain? Yes   Pain Score 8   sometimes 0/10 when sitting and not moving   Pain Location Back   Pain Orientation Left;Right;Mid   Pain Descriptors / Indicators Aching;Nagging   Pain Type Chronic pain   Pain Radiating Towards sometimes  radiates down bil knees   Pain Onset More than a month ago   Pain Frequency Intermittent   Aggravating Factors  standing  , walking is better, going up and down steps             [facility] PT Assessment - 06/18/16 1340      Assessment   Medical Diagnosis Chronic low back without sciatica   Referring Provider Jill Alexanders MD   Onset Date/Surgical Date --  started back pain in his 72s in the [place]   Prior Therapy None     Precautions   Precautions None     Restrictions   Weight Bearing Restrictions No     Balance Screen   Has the patient fallen in the past 6 months No   Has the patient had a decrease in activity level because of a fear of falling?  No   Is the patient reluctant to  leave their home because of a fear of falling?  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     Cognition   Overall Cognitive Status Within Functional Limits for tasks assessed     Observation/Other Assessments   Observations left leg shorter than right approximately 3/4 inch   Focus on Therapeutic Outcomes (FOTO)  FOTO intake 36% limitation 64% predicted 40%     Circumferential Edema   Circumferential - Right 62.5 ankle    Circumferential - Left  65cm ankle     Posture/Postural Control   Posture/Postural Control Postural limitations  6 foot 4 inch   Postural Limitations Rounded Shoulders;Forward head;Posterior pelvic tilt   Posture Comments Right pelvic level elevated on right  shortened left leg, not wearing heel lift presently      ROM / Strength   AROM / PROM / Strength AROM;Strength     AROM   Lumbar Flexion 75  tightness   Lumbar Extension 15   Lumbar - Right Side Bend 18   Lumbar - Left Side Bend 20   Lumbar - Right Rotation 75%   Lumbar - Left Rotation 75%     Strength   Overall Strength Deficits   Overall Strength Comments abdominal 3/5   Right Hip Flexion 4-/5   Right Hip Extension 3-/5   Right Hip ABduction 3-/5   Left Hip Flexion 4-/5   Left Hip Extension 3-/5   Left Hip ABduction 4-/5     Flexibility   Hamstrings Pt with 60 degrees for bil hamstrings and also tightened IT band bil     Palpation   Palpation comment lumbosacral tenderness  with shearing with elevated right pelvis and left shortened leg     Transfers   Five time sit to stand comments  20 seconds   Normal for 81 yo is < 11 seconds     Ambulation/Gait   Ambulation/Gait Yes   Ambulation/Gait  Assistance 6: Modified independent (Device/Increase time)  with cane   Assistive device Straight cane   Gait Pattern Decreased stride length;Right circumduction  left shortened leg   Ambulation Surface Level   Gait velocity 2.05 ft/sec   Stairs --  pt avoids  [place] Adult PT Treatment/Exercise - 06/18/16 1340      Self-Care   Self-Care Posture   Posture educated on initial standing and sitting posture and intiial ergonomic desk/computer suggestions for proper alignment     Lumbar Exercises: Stretches   Single Knee to Chest Stretch 5 reps;10 seconds   Single Knee to Chest Stretch Limitations bil   Double Knee to Chest Stretch 3 reps;30 seconds   Double Knee to Chest Stretch Limitations feels better    Lower Trunk Rotation 30 seconds;2 reps   Lower Trunk Rotation Limitations bil   Quadruped Mid Back Stretch 2 reps;20 seconds   Quadruped Mid Back Stretch Limitations Pt able to perform with CGA but knees and ankles uncomfortable     Lumbar Exercises: Standing   Other Standing Lumbar Exercises standing back extension 5 x for 10 -15 seconds  pt with best relief with extension                PT Education - 06/18/16 1456    Education provided Yes   Education Details POC, explanation of findings, initial HEP and posture education   Person(s) Educated Patient   Methods Explanation;Demonstration;Verbal cues;Handout;Tactile cues   Comprehension Verbalized understanding;Returned demonstration             PT Long Term Goals - 06/18/16 1718      PT LONG TERM GOAL #1   Title "Independent with advanced HEP   Time 6   Period Weeks   Status New     PT LONG TERM GOAL #2   Title "Pain will decrease to  3/10 or less  with all functional activities   Time 6   Period Weeks   Status New     PT LONG TERM GOAL #3   Title "FOTO will improve from 64% limitation   to  40% limitation   indicating improved functional mobility    Time 6   Period Weeks    Status New     PT LONG TERM GOAL #4   Title Improve gait velocity from 2.20ft.sec to at least 2.10ft/sec which is limited communtiy ambulator with LRAD   Time 6   Period Weeks   Status New     PT LONG TERM GOAL #5   Title "Demonstrate and verbalize techniques to reduce the risk of re-injury including: lifting, posture, body mechanics.    Time 6   Period Weeks   Status New     Additional Long Term Goals   Additional Long Term Goals Yes     PT LONG TERM GOAL #6   Title "Pt will improve bil knee hip strength to >/= 4/5 with </= 2/10 pain in back  to promote safety with walking/standing activities   Time 6   Period Weeks   Status New               Plan - 06/18/16 1457    Clinical Impression Statement Pt is a 81 year old male with c/o of increased LBP and occasional radicular pain into bil LE's and that responds positively to extension and flexibility exericises. Pt presents with moderate complexity eval and has COPD which limits his endurance for exericise.  He presents with low back pain bilateally with only occasional radiation of pain,  He also has a shortened left leg about 3/4 of an inch which causes shearing forces in lumbosacral area.  Pt given heel lift to allevitate discrepancy.  Pt also presents with general deconditining and weakness and can  only stand 5 minutes without fatiguings.  He utilizes a cane to ambulate due to unsteadiness on feel.  Pt will benefit from skilled PT 2 x a week for 6 weeks to address , ROM, strength deficits, abnormal posture and back pain, functional impairments.   Rehab Potential Good   PT Frequency 2x / week   PT Duration 6 weeks   PT Treatment/Interventions ADLs/Self Care Home Management;Cryotherapy;Electrical Stimulation;Iontophoresis 4mg /ml Dexamethasone;Moist Heat;Traction;Ultrasound;Gait training;Stair training;Therapeutic exercise;Neuromuscular re-education;Balance training;Patient/family education;Manual techniques;Dry needling;Taping    PT Next Visit Plan Basic back program and extension exericises as needed for pain relief,  Gait training   PT Home Exercise Plan trunk rotation, single limb and double limb to chest    Consulted and Agree with Plan of Care Patient      Patient will benefit from skilled therapeutic intervention in order to improve the following deficits and impairments:  Cardiopulmonary status limiting activity, Decreased balance, Decreased mobility, Decreased strength, Postural dysfunction, Improper body mechanics, Pain, Obesity, Decreased range of motion, Abnormal gait, Hypomobility  Visit Diagnosis: Chronic midline low back pain with sciatica, sciatica laterality unspecified  Muscle weakness (generalized)  Abnormal posture  Unsteadiness on feet      G-Codes - 2016-06-27 1426    Functional Assessment Tool Used FOTO   Functional Limitation Mobility: Walking and moving around   Mobility: Walking and Moving Around Current Status [phone]) At least 40 percent but less than 60 percent impaired, limited or restricted  64%   Mobility: Walking and Moving Around Goal Status [phone]) At least 20 percent but less than 40 percent impaired, limited or restricted  40%       Problem List Patient Active Problem List   Diagnosis Date Noted  . Chronic back pain 06/09/2016  . Hearing loss due to cerumen impaction 12/06/2015  . Congestive heart failure ([place]) 10/19/2014  . OSA on CPAP 10/18/2014  . Leukocytosis 10/09/2014  . Chronic respiratory failure with hypoxia ([place]) 03/29/2014  . Coronary artery spasm ([place]) 03/29/2013  . Hypertension 03/26/2011    Dorothea Ogle 06-27-2016, 5:44 PM  [facility] [address] [place], [place], [address] Phone: [phone]   Fax:  [phone]  Name: Curtis Riley MRN: [id number] Date of Birth: [birth year]

## 2016-06-25 ENCOUNTER — Ambulatory Visit: Payer: Medicare Other | Attending: Family Medicine | Admitting: Physical Therapy

## 2016-06-25 DIAGNOSIS — R293 Abnormal posture: Secondary | ICD-10-CM | POA: Insufficient documentation

## 2016-06-25 DIAGNOSIS — G8929 Other chronic pain: Secondary | ICD-10-CM | POA: Insufficient documentation

## 2016-06-25 DIAGNOSIS — M6281 Muscle weakness (generalized): Secondary | ICD-10-CM | POA: Insufficient documentation

## 2016-06-25 DIAGNOSIS — R2681 Unsteadiness on feet: Secondary | ICD-10-CM | POA: Insufficient documentation

## 2016-06-25 DIAGNOSIS — M5440 Lumbago with sciatica, unspecified side: Secondary | ICD-10-CM | POA: Insufficient documentation

## 2016-06-25 NOTE — Therapy (Signed)
[place], [place], [address] Phone: [phone]   Fax:  [phone]  Physical Therapy Treatment  Patient Details  Name: Curtis Riley MRN: [id number] Date of Birth: 12-Apr-1935 Referring Provider: Jill Alexanders MD  Encounter Date: 06/25/2016      PT End of Session - 06/25/16 1142    Visit Number 2   Number of Visits 12   Date for PT Re-Evaluation 07/30/16   Authorization Type Medicare   PT Start Time 1100   PT Stop Time 1145   PT Time Calculation (min) 45 min      Past Medical History:  Diagnosis Date  . Angina    occasional chest pain  never been treated for this ...   . Arthritis   . BPH (benign prostatic hyperplasia)   . COPD (chronic obstructive pulmonary disease) ([place])   . CVA (cerebral infarction)   . Diverticulosis   . Dyslipidemia   . Family history of coronary artery disease    brothers, sisters  . History of nuclear stress test 03/2010   dobutamine; mild ischemia in mid inferior and apical inferior regions; ekg negative for ischemia; low risk scan   . Hypertension   . Kidney stones    10-15 yrs ago  . Neuromuscular disorder ([place])   . Shortness of breath    with exertion   . Sleep apnea    uses cpap  . Smoker     Past Surgical History:  Procedure Laterality Date  . ANTERIOR CERVICAL DECOMP/DISCECTOMY FUSION  10/06/2011   Procedure: ANTERIOR CERVICAL DECOMPRESSION/DISCECTOMY FUSION 3 LEVELS;  Surgeon: Faythe Ghee, MD;  Location: [facility];  Service: Neurosurgery;  Laterality: Bilateral;  Cervical three-four, four-five, five-six anterior cervical decompression, fusion  . CARDIAC CATHETERIZATION  10/2010   small bridging segment in LAD  . TONSILLECTOMY  age 63  . TRANSTHORACIC ECHOCARDIOGRAM  2004   normal LV size and function, aortic root dilated  . TRANSURETHRAL RESECTION OF PROSTATE  08/1996  . TRANSURETHRAL RESECTION OF PROSTATE     long time ago per pt.  10-15 yrs     There were no vitals filed for this visit.                       [place] Adult PT Treatment/Exercise - 06/25/16 0001      Lumbar Exercises: Stretches   Single Knee to Chest Stretch 3 reps;30 seconds   Double Knee to Chest Stretch 3 reps;30 seconds   Lower Trunk Rotation 30 seconds;3 reps     Lumbar Exercises: Aerobic   Stationary Bike Nustep L3 x 5 minutes      Lumbar Exercises: Supine   Bridge 10 reps   Bridge Limitations with DF   Straight Leg Raise 10 reps     Lumbar Exercises: Sidelying   Clam 10 reps                     PT Long Term Goals - 06/18/16 1718      PT LONG TERM GOAL #1   Title "Independent with advanced HEP   Time 6   Period Weeks   Status New     PT LONG TERM GOAL #2   Title "Pain will decrease to  3/10 or less  with all functional activities   Time 6   Period Weeks   Status New     PT LONG TERM GOAL #3   Title "  FOTO will improve from 64% limitation   to  40% limitation   indicating improved functional mobility    Time 6   Period Weeks   Status New     PT LONG TERM GOAL #4   Title Improve gait velocity from 2.1ft.sec to at least 2.30ft/sec which is limited communtiy ambulator with LRAD   Time 6   Period Weeks   Status New     PT LONG TERM GOAL #5   Title "Demonstrate and verbalize techniques to reduce the risk of re-injury including: lifting, posture, body mechanics.    Time 6   Period Weeks   Status New     Additional Long Term Goals   Additional Long Term Goals Yes     PT LONG TERM GOAL #6   Title "Pt will improve bil knee hip strength to >/= 4/5 with </= 2/10 pain in back  to promote safety with walking/standing activities   Time 6   Period Weeks   Status New               Plan - 06/25/16 1147    Clinical Impression Statement Pt has not perfromed HEP due to sudden death in family. We reviewed his HEP and began additional core/ hip strengthening. Pt reluctant to add to HEP today. He plans  to be consistent with HEP and we can add more next visit.    PT Next Visit Plan Basic back program and extension exericises as needed for pain relief,  Gait training   PT Home Exercise Plan trunk rotation, single limb and double limb to chest       Patient will benefit from skilled therapeutic intervention in order to improve the following deficits and impairments:  Cardiopulmonary status limiting activity, Decreased balance, Decreased mobility, Decreased strength, Postural dysfunction, Improper body mechanics, Pain, Obesity, Decreased range of motion, Abnormal gait, Hypomobility  Visit Diagnosis: Chronic midline low back pain with sciatica, sciatica laterality unspecified  Muscle weakness (generalized)  Abnormal posture  Unsteadiness on feet     Problem List Patient Active Problem List   Diagnosis Date Noted  . Chronic back pain 06/09/2016  . Hearing loss due to cerumen impaction 12/06/2015  . Congestive heart failure ([place]) 10/19/2014  . OSA on CPAP 10/18/2014  . Leukocytosis 10/09/2014  . Chronic respiratory failure with hypoxia ([place]) 03/29/2014  . Coronary artery spasm ([place]) 03/29/2013  . Hypertension 03/26/2011    Dorene Ar, PTA 06/25/2016, 2:12 PM  [facility] [address] [place], [place], [address] Phone: [phone]   Fax:  [phone]  Name: LAROME SHADBOLT MRN: [id number] Date of Birth: Oct 14, 1934

## 2016-06-30 ENCOUNTER — Ambulatory Visit: Payer: Medicare Other | Admitting: Physical Therapy

## 2016-06-30 DIAGNOSIS — G8929 Other chronic pain: Secondary | ICD-10-CM

## 2016-06-30 DIAGNOSIS — M5440 Lumbago with sciatica, unspecified side: Principal | ICD-10-CM

## 2016-06-30 DIAGNOSIS — R293 Abnormal posture: Secondary | ICD-10-CM | POA: Diagnosis not present

## 2016-06-30 DIAGNOSIS — M6281 Muscle weakness (generalized): Secondary | ICD-10-CM

## 2016-06-30 DIAGNOSIS — R2681 Unsteadiness on feet: Secondary | ICD-10-CM

## 2016-06-30 NOTE — Therapy (Signed)
[place], [place], [address] Phone: [phone]   Fax:  [phone]  Physical Therapy Treatment  Patient Details  Name: IHSAN ROGAS MRN: [id number] Date of Birth: 03/13/1935 Referring Provider: Jill Alexanders MD  Encounter Date: 06/30/2016      PT End of Session - 06/30/16 1105    Visit Number 3   Number of Visits 12   Date for PT Re-Evaluation 07/30/16   Authorization Type Medicare   PT Start Time 1100   PT Stop Time 1140   PT Time Calculation (min) 40 min      Past Medical History:  Diagnosis Date  . Angina    occasional chest pain  never been treated for this ...   . Arthritis   . BPH (benign prostatic hyperplasia)   . COPD (chronic obstructive pulmonary disease) ([place])   . CVA (cerebral infarction)   . Diverticulosis   . Dyslipidemia   . Family history of coronary artery disease    brothers, sisters  . History of nuclear stress test 03/2010   dobutamine; mild ischemia in mid inferior and apical inferior regions; ekg negative for ischemia; low risk scan   . Hypertension   . Kidney stones    10-15 yrs ago  . Neuromuscular disorder ([place])   . Shortness of breath    with exertion   . Sleep apnea    uses cpap  . Smoker     Past Surgical History:  Procedure Laterality Date  . ANTERIOR CERVICAL DECOMP/DISCECTOMY FUSION  10/06/2011   Procedure: ANTERIOR CERVICAL DECOMPRESSION/DISCECTOMY FUSION 3 LEVELS;  Surgeon: Faythe Ghee, MD;  [facility]: [facility];  Service: Neurosurgery;  Laterality: Bilateral;  Cervical three-four, four-five, five-six anterior cervical decompression, fusion  . CARDIAC CATHETERIZATION  10/2010   small bridging segment in LAD  . TONSILLECTOMY  age 81  . TRANSTHORACIC ECHOCARDIOGRAM  2004   normal LV size and function, aortic root dilated  . TRANSURETHRAL RESECTION OF PROSTATE  08/1996  . TRANSURETHRAL RESECTION OF PROSTATE     long time ago per pt.  10-15 yrs     There were no vitals filed for this visit.      Subjective Assessment - 06/30/16 1105    Subjective I think the exercises help me feel looser   Currently in Pain? Yes   Pain Score 3    Pain Location Knee   Pain Orientation Right;Left   Pain Descriptors / Indicators Aching   Aggravating Factors  walking   Pain Relieving Factors sitting                          [facility] Adult PT Treatment/Exercise - 06/30/16 0001      Lumbar Exercises: Stretches   Single Knee to Chest Stretch 3 reps;30 seconds   Single Knee to Chest Stretch Limitations bil   Double Knee to Chest Stretch 3 reps;30 seconds   Lower Trunk Rotation 30 seconds;3 reps     Lumbar Exercises: Aerobic   Stationary Bike Nustep L4 x 5 minutes      Lumbar Exercises: Supine   Bridge 10 reps  2 sets   Bridge Limitations with DF   Straight Leg Raise 10 reps   Straight Leg Raises Limitations with ab draw in     Lumbar Exercises: Sidelying   Clam 20 reps   Clam Limitations bil  PT Education - 06/30/16 1130    Education provided Yes   Education Details HEP    Person(s) Educated Patient   Methods Explanation;Handout   Comprehension Verbalized understanding             PT Long Term Goals - 06/18/16 1718      PT LONG TERM GOAL #1   Title "Independent with advanced HEP   Time 6   Period Weeks   Status New     PT LONG TERM GOAL #2   Title "Pain will decrease to  3/10 or less  with all functional activities   Time 6   Period Weeks   Status New     PT LONG TERM GOAL #3   Title "FOTO will improve from 64% limitation   to  40% limitation   indicating improved functional mobility    Time 6   Period Weeks   Status New     PT LONG TERM GOAL #4   Title Improve gait velocity from 2.52ft.sec to at least 2.43ft/sec which is limited communtiy ambulator with LRAD   Time 6   Period Weeks   Status New     PT LONG TERM GOAL #5   Title "Demonstrate and verbalize techniques  to reduce the risk of re-injury including: lifting, posture, body mechanics.    Time 6   Period Weeks   Status New     Additional Long Term Goals   Additional Long Term Goals Yes     PT LONG TERM GOAL #6   Title "Pt will improve bil knee hip strength to >/= 4/5 with </= 2/10 pain in back  to promote safety with walking/standing activities   Time 6   Period Weeks   Status New               Plan - 06/30/16 1144    Clinical Impression Statement Mr Sollazzo has perfromed his HEP everyday except yesterday. He reports he felt good after last treatment and was looser. We reviewed his HEP and added hip/ core strengthening.    PT Next Visit Plan Basic back program and extension exericises as needed for pain relief,  Gait training   PT Home Exercise Plan trunk rotation, single limb and double limb to chest, clam, bridge with DF, SLR with core brace    Consulted and Agree with Plan of Care Patient      Patient will benefit from skilled therapeutic intervention in order to improve the following deficits and impairments:  Cardiopulmonary status limiting activity, Decreased balance, Decreased mobility, Decreased strength, Postural dysfunction, Improper body mechanics, Pain, Obesity, Decreased range of motion, Abnormal gait, Hypomobility  Visit Diagnosis: Chronic midline low back pain with sciatica, sciatica laterality unspecified  Muscle weakness (generalized)  Unsteadiness on feet  Abnormal posture     Problem List Patient Active Problem List   Diagnosis Date Noted  . Chronic back pain 06/09/2016  . Hearing loss due to cerumen impaction 12/06/2015  . Congestive heart failure ([place]) 10/19/2014  . OSA on CPAP 10/18/2014  . Leukocytosis 10/09/2014  . Chronic respiratory failure with hypoxia ([place]) 03/29/2014  . Coronary artery spasm ([place]) 03/29/2013  . Hypertension 03/26/2011    Dorene Ar, PTA 06/30/2016, 11:59 AM  [name], [place], [address] Phone: [phone]   Fax:  [phone]  Name: JENNIFER QUINTOS MRN: [id number] Date of Birth: 04/10/1935

## 2016-07-02 ENCOUNTER — Ambulatory Visit: Payer: Medicare Other | Admitting: Physical Therapy

## 2016-07-02 DIAGNOSIS — G8929 Other chronic pain: Secondary | ICD-10-CM | POA: Diagnosis not present

## 2016-07-02 DIAGNOSIS — R2681 Unsteadiness on feet: Secondary | ICD-10-CM | POA: Diagnosis not present

## 2016-07-02 DIAGNOSIS — R293 Abnormal posture: Secondary | ICD-10-CM | POA: Diagnosis not present

## 2016-07-02 DIAGNOSIS — M6281 Muscle weakness (generalized): Secondary | ICD-10-CM | POA: Diagnosis not present

## 2016-07-02 DIAGNOSIS — M5440 Lumbago with sciatica, unspecified side: Principal | ICD-10-CM

## 2016-07-02 NOTE — Therapy (Signed)
[place], [place], [address] Phone: [phone]   Fax:  [phone]  Physical Therapy Treatment  Patient Details  Name: Curtis Riley MRN: [id number] Date of Birth: 23-Dec-1934 Referring Provider: Jill Alexanders MD  Encounter Date: 07/02/2016      PT End of Session - 07/02/16 1415    Visit Number 4   Number of Visits 12   Date for PT Re-Evaluation 07/30/16   PT Start Time 1330   PT Stop Time 1415   PT Time Calculation (min) 45 min   Activity Tolerance Patient tolerated treatment well   Behavior During Therapy [facility] for tasks assessed/performed      Past Medical History:  Diagnosis Date  . Angina    occasional chest pain  never been treated for this ...   . Arthritis   . BPH (benign prostatic hyperplasia)   . COPD (chronic obstructive pulmonary disease) ([place])   . CVA (cerebral infarction)   . Diverticulosis   . Dyslipidemia   . Family history of coronary artery disease    brothers, sisters  . History of nuclear stress test 03/2010   dobutamine; mild ischemia in mid inferior and apical inferior regions; ekg negative for ischemia; low risk scan   . Hypertension   . Kidney stones    10-15 yrs ago  . Neuromuscular disorder ([place])   . Shortness of breath    with exertion   . Sleep apnea    uses cpap  . Smoker     Past Surgical History:  Procedure Laterality Date  . ANTERIOR CERVICAL DECOMP/DISCECTOMY FUSION  10/06/2011   Procedure: ANTERIOR CERVICAL DECOMPRESSION/DISCECTOMY FUSION 3 LEVELS;  Surgeon: Faythe Ghee, MD;  Location: [facility];  Service: Neurosurgery;  Laterality: Bilateral;  Cervical three-four, four-five, five-six anterior cervical decompression, fusion  . CARDIAC CATHETERIZATION  10/2010   small bridging segment in LAD  . TONSILLECTOMY  age 82  . TRANSTHORACIC ECHOCARDIOGRAM  2004   normal LV size and function, aortic root dilated  . TRANSURETHRAL RESECTION OF PROSTATE   08/1996  . TRANSURETHRAL RESECTION OF PROSTATE     long time ago per pt.  10-15 yrs    There were no vitals filed for this visit.      Subjective Assessment - 07/02/16 1339    Subjective I am about same since 2 days   Currently in Pain? Yes   Pain Score 3    Pain Location Knee   Pain Orientation Right;Left   Pain Descriptors / Indicators Aching   Pain Type Chronic pain   Pain Frequency Intermittent   Aggravating Factors  walking too long, standing too long.   Pain Relieving Factors sitting                         [facility] Adult PT Treatment/Exercise - 07/02/16 0001      Lumbar Exercises: Stretches   Passive Hamstring Stretch 3 reps;30 seconds   Lower Trunk Rotation 30 seconds;3 reps     Lumbar Exercises: Aerobic   Stationary Bike Nustep L4 x 6 minutes      Lumbar Exercises: Standing   Other Standing Lumbar Exercises walking stick lengthened.     Lumbar Exercises: Supine   Glut Set 10 reps   Bridge 15 reps  2 sets   Bridge Limitations with DF cues for breathing   Straight Leg Raise 10 reps   Straight Leg Raises Limitations with ab draw  in     Lumbar Exercises: Sidelying   Clam 20 reps   Clam Limitations bil                     PT Long Term Goals - 07/02/16 1355      PT LONG TERM GOAL #1   Title "Independent with advanced HEP   Time 6   Period Weeks   Status On-going     PT LONG TERM GOAL #2   Title "Pain will decrease to  3/10 or less  with all functional activities   Baseline 8/10 pain for 30 minutes shopping with  cart   Time 6   Period Weeks   Status On-going     PT LONG TERM GOAL #3   Title "FOTO will improve from 64% limitation   to  40% limitation   indicating improved functional mobility    Time 6   Period Weeks   Status Unable to assess     PT LONG TERM GOAL #4   Title Improve gait velocity from 2.18ft.sec to at least 2.63ft/sec which is limited communtiy ambulator with LRAD   Time 6   Period Weeks   Status  Unable to assess     PT LONG TERM GOAL #5   Title "Demonstrate and verbalize techniques to reduce the risk of re-injury including: lifting, posture, body mechanics.    Time 6   Period Weeks   Status Unable to assess     PT LONG TERM GOAL #6   Title "Pt will improve bil knee hip strength to >/= 4/5 with </= 2/10 pain in back  to promote safety with walking/standing activities   Time 6   Period Weeks   Status On-going               Plan - 07/02/16 1415    Clinical Impression Statement Walking stick lengthened a little.  It couls still be longer.  Basic back exercises given.  No new goals or objective findings noted. Able to walk 30 minutes with cart with 8/10 pain.   PT Next Visit Plan Basic back program and extension exericises as needed for pain relief,  Gait training   PT Home Exercise Plan trunk rotation, single limb and double limb to chest, clam, bridge with DF, SLR with core brace    Consulted and Agree with Plan of Care Patient      Patient will benefit from skilled therapeutic intervention in order to improve the following deficits and impairments:  Cardiopulmonary status limiting activity, Decreased balance, Decreased mobility, Decreased strength, Postural dysfunction, Improper body mechanics, Pain, Obesity, Decreased range of motion, Abnormal gait, Hypomobility  Visit Diagnosis: Chronic midline low back pain with sciatica, sciatica laterality unspecified  Muscle weakness (generalized)  Unsteadiness on feet  Abnormal posture     Problem List Patient Active Problem List   Diagnosis Date Noted  . Chronic back pain 06/09/2016  . Hearing loss due to cerumen impaction 12/06/2015  . Congestive heart failure ([place]) 10/19/2014  . OSA on CPAP 10/18/2014  . Leukocytosis 10/09/2014  . Chronic respiratory failure with hypoxia ([place]) 03/29/2014  . Coronary artery spasm ([place]) 03/29/2013  . Hypertension 03/26/2011    Jakavion Bilodeau PTA 07/02/2016, 2:17 PM  [facility] [address] [place], [place], [address] Phone: [phone]   Fax:  [phone]  Name: Curtis Riley MRN: [id number] Date of Birth: 05/05/1935

## 2016-07-06 ENCOUNTER — Ambulatory Visit: Payer: Medicare Other | Admitting: Physical Therapy

## 2016-07-06 DIAGNOSIS — R2681 Unsteadiness on feet: Secondary | ICD-10-CM | POA: Diagnosis not present

## 2016-07-06 DIAGNOSIS — R293 Abnormal posture: Secondary | ICD-10-CM | POA: Diagnosis not present

## 2016-07-06 DIAGNOSIS — M5440 Lumbago with sciatica, unspecified side: Secondary | ICD-10-CM | POA: Diagnosis not present

## 2016-07-06 DIAGNOSIS — G8929 Other chronic pain: Secondary | ICD-10-CM | POA: Diagnosis not present

## 2016-07-06 DIAGNOSIS — M6281 Muscle weakness (generalized): Secondary | ICD-10-CM | POA: Diagnosis not present

## 2016-07-06 NOTE — Therapy (Signed)
[place], [place], [address] Phone: [phone]   Fax:  [phone]  Physical Therapy Treatment  Patient Details  Name: Curtis Riley MRN: [id number] Date of Birth: 02/17/1935 Referring Provider: Jill Alexanders MD  Encounter Date: 07/06/2016      PT End of Session - 07/06/16 1255    Visit Number 5   Number of Visits 12   Date for PT Re-Evaluation 07/30/16   PT Start Time 1146   PT Stop Time 1230   PT Time Calculation (min) 44 min   Activity Tolerance Patient tolerated treatment well   Behavior During Therapy [facility] for tasks assessed/performed      Past Medical History:  Diagnosis Date  . Angina    occasional chest pain  never been treated for this ...   . Arthritis   . BPH (benign prostatic hyperplasia)   . COPD (chronic obstructive pulmonary disease) ([place])   . CVA (cerebral infarction)   . Diverticulosis   . Dyslipidemia   . Family history of coronary artery disease    brothers, sisters  . History of nuclear stress test 03/2010   dobutamine; mild ischemia in mid inferior and apical inferior regions; ekg negative for ischemia; low risk scan   . Hypertension   . Kidney stones    10-15 yrs ago  . Neuromuscular disorder ([place])   . Shortness of breath    with exertion   . Sleep apnea    uses cpap  . Smoker     Past Surgical History:  Procedure Laterality Date  . ANTERIOR CERVICAL DECOMP/DISCECTOMY FUSION  10/06/2011   Procedure: ANTERIOR CERVICAL DECOMPRESSION/DISCECTOMY FUSION 3 LEVELS;  Surgeon: Faythe Ghee, MD;  Location: [facility];  Service: Neurosurgery;  Laterality: Bilateral;  Cervical three-four, four-five, five-six anterior cervical decompression, fusion  . CARDIAC CATHETERIZATION  10/2010   small bridging segment in LAD  . TONSILLECTOMY  age 81  . TRANSTHORACIC ECHOCARDIOGRAM  2004   normal LV size and function, aortic root dilated  . TRANSURETHRAL RESECTION OF PROSTATE   08/1996  . TRANSURETHRAL RESECTION OF PROSTATE     long time ago per pt.  10-15 yrs    There were no vitals filed for this visit.      Subjective Assessment - 07/06/16 1207    Subjective Pain is the same when it comes.  It is less frequent   Currently in Pain? Yes   Pain Score 3    Pain Location Knee   Pain Orientation Right;Left   Pain Type Chronic pain   Pain Frequency Intermittent   Aggravating Factors  walking too long standing too long   Pain Relieving Factors sitting                         [facility] Adult PT Treatment/Exercise - 07/06/16 0001      Ambulation/Gait   Assistive device --  walking stick   Gait Comments valgus at knees.  Thighs rub with complete contact during gait,  he is able to walk with heel toe pattern.    forward trunk lean increases with walking duration.     Lumbar Exercises: Stretches   Passive Hamstring Stretch 3 reps;30 seconds  both   Double Knee to Chest Stretch --  10 X with abdominal tightness, feet on ball   Lower Trunk Rotation --  10 X 5 seconds , legs on ball     Lumbar Exercises: Aerobic  Stationary Bike Nustep L4 x 6 minutes      Lumbar Exercises: Supine   Glut Set 10 reps  with quad sets   Clam 20 reps   Clam Limitations black band   Bridge 15 reps   Straight Leg Raise 10 reps   Straight Leg Raises Limitations from ball     Lumbar Exercises: Sidelying   Clam --   Clam Limitations --     Ankle Exercises: Stretches   Soleus Stretch 3 reps;30 seconds   Gastroc Stretch 1 rep;30 seconds   Gastroc Stretch Limitations painful in knees                     PT Long Term Goals - 07/06/16 1259      PT LONG TERM GOAL #1   Title "Independent with advanced HEP   Baseline independent with exercises issued so far   Time 6   Period Weeks   Status On-going     PT LONG TERM GOAL #2   Title "Pain will decrease to  3/10 or less  with all functional activities   Baseline Pain moderate to severe with  longer walking   Time 6   Period Weeks   Status On-going     PT LONG TERM GOAL #3   Title "FOTO will improve from 64% limitation   to  40% limitation   indicating improved functional mobility    Time 6   Period Weeks   Status Unable to assess     PT LONG TERM GOAL #4   Title Improve gait velocity from 2.67ft.sec to at least 2.34ft/sec which is limited communtiy ambulator with LRAD   Time 6   Period Weeks   Status Unable to assess     PT LONG TERM GOAL #5   Title "Demonstrate and verbalize techniques to reduce the risk of re-injury including: lifting, posture, body mechanics.    Time 6   Period Weeks   Status Unable to assess     PT LONG TERM GOAL #6   Title "Pt will improve bil knee hip strength to >/= 4/5 with </= 2/10 pain in back  to promote safety with walking/standing activities   Time 6   Period Weeks   Status Unable to assess               Plan - 07/06/16 1255    Clinical Impression Statement Strengthening/ stretching for low back was focus of today.  Walking duration 10 minutes per report. No pain increase with PT today.     PT Next Visit Plan stabilization add calf stretch to HEP, MMT hip, knee.  Closed chain.   PT Home Exercise Plan trunk rotation, single limb and double limb to chest, clam, bridge with DF, SLR with core brace    Consulted and Agree with Plan of Care Patient      Patient will benefit from skilled therapeutic intervention in order to improve the following deficits and impairments:  Cardiopulmonary status limiting activity, Decreased balance, Decreased mobility, Decreased strength, Postural dysfunction, Improper body mechanics, Pain, Obesity, Decreased range of motion, Abnormal gait, Hypomobility  Visit Diagnosis: Chronic midline low back pain with sciatica, sciatica laterality unspecified  Muscle weakness (generalized)  Unsteadiness on feet  Abnormal posture     Problem List Patient Active Problem List   Diagnosis Date Noted  .  Chronic back pain 06/09/2016  . Hearing loss due to cerumen impaction 12/06/2015  . Congestive heart failure ([place]) 10/19/2014  .  OSA on CPAP 10/18/2014  . Leukocytosis 10/09/2014  . Chronic respiratory failure with hypoxia ([place]) 03/29/2014  . Coronary artery spasm ([place]) 03/29/2013  . Hypertension 03/26/2011    Julien Oscar PTA 07/06/2016, 1:03 PM  [facility] [address] [place], [place], [address] Phone: [phone]   Fax:  [phone]  Name: Curtis Riley MRN: [id number] Date of Birth: [birth year]

## 2016-07-08 ENCOUNTER — Ambulatory Visit: Payer: Medicare Other | Admitting: Physical Therapy

## 2016-07-08 DIAGNOSIS — M5440 Lumbago with sciatica, unspecified side: Secondary | ICD-10-CM | POA: Diagnosis not present

## 2016-07-08 DIAGNOSIS — M6281 Muscle weakness (generalized): Secondary | ICD-10-CM

## 2016-07-08 DIAGNOSIS — R2681 Unsteadiness on feet: Secondary | ICD-10-CM | POA: Diagnosis not present

## 2016-07-08 DIAGNOSIS — G8929 Other chronic pain: Secondary | ICD-10-CM | POA: Diagnosis not present

## 2016-07-08 DIAGNOSIS — R293 Abnormal posture: Secondary | ICD-10-CM | POA: Diagnosis not present

## 2016-07-08 NOTE — Therapy (Signed)
[place], [place], [address] Phone: [phone]   Fax:  [phone]  Physical Therapy Treatment  Patient Details  Name: Curtis Riley MRN: [id number] Date of Birth: 08-Apr-1935 Referring Provider: Jill Alexanders MD  Encounter Date: 07/08/2016      PT End of Session - 07/08/16 1327    Visit Number 6   Number of Visits 12   Date for PT Re-Evaluation 07/30/16   Authorization Type Medicare   PT Start Time 1230   PT Stop Time 1315   PT Time Calculation (min) 45 min      Past Medical History:  Diagnosis Date  . Angina    occasional chest pain  never been treated for this ...   . Arthritis   . BPH (benign prostatic hyperplasia)   . COPD (chronic obstructive pulmonary disease) ([place])   . CVA (cerebral infarction)   . Diverticulosis   . Dyslipidemia   . Family history of coronary artery disease    brothers, sisters  . History of nuclear stress test 03/2010   dobutamine; mild ischemia in mid inferior and apical inferior regions; ekg negative for ischemia; low risk scan   . Hypertension   . Kidney stones    10-15 yrs ago  . Neuromuscular disorder ([place])   . Shortness of breath    with exertion   . Sleep apnea    uses cpap  . Smoker     Past Surgical History:  Procedure Laterality Date  . ANTERIOR CERVICAL DECOMP/DISCECTOMY FUSION  10/06/2011   Procedure: ANTERIOR CERVICAL DECOMPRESSION/DISCECTOMY FUSION 3 LEVELS;  Surgeon: Faythe Ghee, MD;  Location: [facility];  Service: Neurosurgery;  Laterality: Bilateral;  Cervical three-four, four-five, five-six anterior cervical decompression, fusion  . CARDIAC CATHETERIZATION  10/2010   small bridging segment in LAD  . TONSILLECTOMY  age 42  . TRANSTHORACIC ECHOCARDIOGRAM  2004   normal LV size and function, aortic root dilated  . TRANSURETHRAL RESECTION OF PROSTATE  08/1996  . TRANSURETHRAL RESECTION OF PROSTATE     long time ago per pt.  10-15 yrs     There were no vitals filed for this visit.          [facility] PT Assessment - 07/08/16 0001      Strength   Right Hip Flexion 4/5   Right Hip ABduction 3-/5   Left Hip Flexion 4-/5   Left Hip ABduction 4-/5                     [facility] Adult PT Treatment/Exercise - 07/08/16 0001      Lumbar Exercises: Stretches   Lower Trunk Rotation 5 reps;10 seconds   Quad Stretch Limitations hip flexor stretch with leg off edge of mat and opposite knee to chest x 1 minute each      Lumbar Exercises: Aerobic   Stationary Bike L 5 x 7 minutes c/ o left knee pain      Lumbar Exercises: Supine   Ab Set 5 reps   Clam 20 reps   Clam Limitations blue band   Heel Slides 10 reps   Heel Slides Limitations with ab set    Bent Knee Raise 20 reps   Bent Knee Raise Limitations with ab set    Bridge 10 reps   Bridge Limitations with DF                     PT Long Term  Goals - 07/06/16 1259      PT LONG TERM GOAL #1   Title "Independent with advanced HEP   Baseline independent with exercises issued so far   Time 6   Period Weeks   Status On-going     PT LONG TERM GOAL #2   Title "Pain will decrease to  3/10 or less  with all functional activities   Baseline Pain moderate to severe with longer walking   Time 6   Period Weeks   Status On-going     PT LONG TERM GOAL #3   Title "FOTO will improve from 64% limitation   to  40% limitation   indicating improved functional mobility    Time 6   Period Weeks   Status Unable to assess     PT LONG TERM GOAL #4   Title Improve gait velocity from 2.24ft.sec to at least 2.52ft/sec which is limited communtiy ambulator with LRAD   Time 6   Period Weeks   Status Unable to assess     PT LONG TERM GOAL #5   Title "Demonstrate and verbalize techniques to reduce the risk of re-injury including: lifting, posture, body mechanics.    Time 6   Period Weeks   Status Unable to assess     PT LONG TERM GOAL #6   Title "Pt will  improve bil knee hip strength to >/= 4/5 with </= 2/10 pain in back  to promote safety with walking/standing activities   Time 6   Period Weeks   Status Unable to assess               Plan - 07/08/16 1306    Clinical Impression Statement Focused lumbar stabilization without increased pain. Improvement in hip flexion strength, hip abduction remains weak, especially right.    PT Next Visit Plan stabilization add calf stretch to HEP, MMT hip, knee.  Begin Closed chain.   PT Home Exercise Plan trunk rotation, single limb and double limb to chest, clam, bridge with DF, SLR with core brace    Consulted and Agree with Plan of Care Patient      Patient will benefit from skilled therapeutic intervention in order to improve the following deficits and impairments:  Cardiopulmonary status limiting activity, Decreased balance, Decreased mobility, Decreased strength, Postural dysfunction, Improper body mechanics, Pain, Obesity, Decreased range of motion, Abnormal gait, Hypomobility  Visit Diagnosis: Chronic midline low back pain with sciatica, sciatica laterality unspecified  Muscle weakness (generalized)  Abnormal posture  Unsteadiness on feet     Problem List Patient Active Problem List   Diagnosis Date Noted  . Chronic back pain 06/09/2016  . Hearing loss due to cerumen impaction 12/06/2015  . Congestive heart failure ([place]) 10/19/2014  . OSA on CPAP 10/18/2014  . Leukocytosis 10/09/2014  . Chronic respiratory failure with hypoxia ([place]) 03/29/2014  . Coronary artery spasm ([place]) 03/29/2013  . Hypertension 03/26/2011    Dorene Ar, PTA 07/08/2016, 1:36 PM  [place], [place], [address] Phone: [phone]   Fax:  [phone]  Name: YUDA CUPO MRN: [id number] Date of Birth: [birth year]

## 2016-07-14 ENCOUNTER — Ambulatory Visit: Payer: Medicare Other | Admitting: Physical Therapy

## 2016-07-14 DIAGNOSIS — R2681 Unsteadiness on feet: Secondary | ICD-10-CM

## 2016-07-14 DIAGNOSIS — G8929 Other chronic pain: Secondary | ICD-10-CM

## 2016-07-14 DIAGNOSIS — M6281 Muscle weakness (generalized): Secondary | ICD-10-CM | POA: Diagnosis not present

## 2016-07-14 DIAGNOSIS — R293 Abnormal posture: Secondary | ICD-10-CM | POA: Diagnosis not present

## 2016-07-14 DIAGNOSIS — M5440 Lumbago with sciatica, unspecified side: Secondary | ICD-10-CM | POA: Diagnosis not present

## 2016-07-14 NOTE — Therapy (Signed)
[place], [place], [address] Phone: [phone]   Fax:  [phone]  Physical Therapy Treatment  Patient Details  Name: Curtis Riley MRN: [id number] Date of Birth: 04-22-1935 Referring Provider: Jill Alexanders MD  Encounter Date: 07/14/2016      PT End of Session - 07/14/16 1237    Visit Number 7   Number of Visits 12   Date for PT Re-Evaluation 07/30/16   PT Start Time [id number]   PT Stop Time 1230   PT Time Calculation (min) 45 min   Activity Tolerance Patient tolerated treatment well   Behavior During Therapy [facility] for tasks assessed/performed      Past Medical History:  Diagnosis Date  . Angina    occasional chest pain  never been treated for this ...   . Arthritis   . BPH (benign prostatic hyperplasia)   . COPD (chronic obstructive pulmonary disease) ([place])   . CVA (cerebral infarction)   . Diverticulosis   . Dyslipidemia   . Family history of coronary artery disease    brothers, sisters  . History of nuclear stress test 03/2010   dobutamine; mild ischemia in mid inferior and apical inferior regions; ekg negative for ischemia; low risk scan   . Hypertension   . Kidney stones    10-15 yrs ago  . Neuromuscular disorder ([place])   . Shortness of breath    with exertion   . Sleep apnea    uses cpap  . Smoker     Past Surgical History:  Procedure Laterality Date  . ANTERIOR CERVICAL DECOMP/DISCECTOMY FUSION  10/06/2011   Procedure: ANTERIOR CERVICAL DECOMPRESSION/DISCECTOMY FUSION 3 LEVELS;  Surgeon: Faythe Ghee, MD;  Location: [facility];  Service: Neurosurgery;  Laterality: Bilateral;  Cervical three-four, four-five, five-six anterior cervical decompression, fusion  . CARDIAC CATHETERIZATION  10/2010   small bridging segment in LAD  . TONSILLECTOMY  age 8  . TRANSTHORACIC ECHOCARDIOGRAM  2004   normal LV size and function, aortic root dilated  . TRANSURETHRAL RESECTION OF PROSTATE   08/1996  . TRANSURETHRAL RESECTION OF PROSTATE     long time ago per pt.  10-15 yrs    There were no vitals filed for this visit.      Subjective Assessment - 07/14/16 1150    Subjective I did not do my exercises over Christmas and I can tell.  I'm stiff.   Currently in Pain? Yes   Pain Score 5    Pain Location Knee   Pain Orientation Right;Left   Pain Descriptors / Indicators Aching   Pain Frequency Intermittent   Aggravating Factors  Walking, standing too long   Pain Relieving Factors sitting   Multiple Pain Sites No            [facility] PT Assessment - 07/14/16 0001      Flexibility   Hamstrings Started HS at 60 both,  improved with 3 stretches to 70.                      Time Adult PT Treatment/Exercise - 07/14/16 0001      Lumbar Exercises: Stretches   Passive Hamstring Stretch 3 reps;30 seconds     Lumbar Exercises: Aerobic   Stationary Bike L5 9 minutes     Lumbar Exercises: Supine   Glut Set 10 reps   Bridge 10 reps     Knee/Hip Exercises: Standing   Heel Raises 10 reps  Heel Raises Limitations m0derate use of hands  toe lifts, both   Hip Abduction 10 reps;2 sets   Abduction Limitations yellow band 1 hand     Knee/Hip Exercises: Seated   Hamstring Curl 3 sets;10 reps   Hamstring Limitations red band, both                     PT Long Term Goals - 07/14/16 1242      PT LONG TERM GOAL #1   Title "Independent with advanced HEP   Baseline independent with exercises issued so far, non compliant with HEP   Time 6   Period Weeks   Status On-going     PT LONG TERM GOAL #2   Title "Pain will decrease to  3/10 or less  with all functional activities   Baseline 5/10   Time 6   Period Weeks   Status On-going     PT LONG TERM GOAL #3   Title "FOTO will improve from 64% limitation   to  40% limitation   indicating improved functional mobility    Time 6   Period Weeks   Status Unable to assess     PT LONG TERM GOAL #4    Title Improve gait velocity from 2.38ft.sec to at least 2.78ft/sec which is limited communtiy ambulator with LRAD   Time 6   Period Weeks   Status Unable to assess     PT LONG TERM GOAL #5   Title "Demonstrate and verbalize techniques to reduce the risk of re-injury including: lifting, posture, body mechanics.    Time 6   Period Weeks   Status Unable to assess     PT LONG TERM GOAL #6   Title "Pt will improve bil knee hip strength to >/= 4/5 with </= 2/10 pain in back  to promote safety with walking/standing activities   Baseline knees  4-4+/5   Time 6   Period Weeks   Status On-going               Plan - 07/14/16 1237    Clinical Impression Statement patient inconsistant with HEP due to Out of town for [employer].  MMT knees: 4/5 hamstringd both.  Quads 4 to 4+/5  progress toward strength goals.  hamstrings continue to be limited to 60 prior to stretching.    PT Next Visit Plan stretch, MMT hips, closed chain   PT Home Exercise Plan trunk rotation, single limb and double limb to chest, clam, bridge with DF, SLR with core brace    Consulted and Agree with Plan of Care Patient      Patient will benefit from skilled therapeutic intervention in order to improve the following deficits and impairments:  Cardiopulmonary status limiting activity, Decreased balance, Decreased mobility, Decreased strength, Postural dysfunction, Improper body mechanics, Pain, Obesity, Decreased range of motion, Abnormal gait, Hypomobility  Visit Diagnosis: Chronic midline low back pain with sciatica, sciatica laterality unspecified  Muscle weakness (generalized)  Abnormal posture  Unsteadiness on feet     Problem List Patient Active Problem List   Diagnosis Date Noted  . Chronic back pain 06/09/2016  . Hearing loss due to cerumen impaction 12/06/2015  . Congestive heart failure ([place]) 10/19/2014  . OSA on CPAP 10/18/2014  . Leukocytosis 10/09/2014  . Chronic respiratory failure with hypoxia  ([place]) 03/29/2014  . Coronary artery spasm ([place]) 03/29/2013  . Hypertension 03/26/2011    HARRIS,KAREN PTA 07/14/2016, 12:44 PM  [facility]  [place], [place], [address] Phone: [phone]   Fax:  [phone]  Name: Curtis Riley MRN: [id number] Date of Birth: 02-12-1935

## 2016-07-16 ENCOUNTER — Ambulatory Visit: Payer: Medicare Other | Admitting: Physical Therapy

## 2016-07-16 DIAGNOSIS — R293 Abnormal posture: Secondary | ICD-10-CM | POA: Diagnosis not present

## 2016-07-16 DIAGNOSIS — G8929 Other chronic pain: Secondary | ICD-10-CM

## 2016-07-16 DIAGNOSIS — M6281 Muscle weakness (generalized): Secondary | ICD-10-CM | POA: Diagnosis not present

## 2016-07-16 DIAGNOSIS — M5440 Lumbago with sciatica, unspecified side: Secondary | ICD-10-CM | POA: Diagnosis not present

## 2016-07-16 DIAGNOSIS — R2681 Unsteadiness on feet: Secondary | ICD-10-CM

## 2016-07-16 NOTE — Therapy (Addendum)
[place], [place], [address] Phone: [phone]   Fax:  [phone]  Physical Therapy Treatment  Patient Details  Name: Curtis Riley MRN: [id number] Date of Birth: May 09, 1935 Referring Provider: Jill Alexanders MD  Encounter Date: 07/16/2016      PT End of Session - 07/16/16 1059    Visit Number 8   Number of Visits 12   Date for PT Re-Evaluation 07/30/16   Authorization Type Medicare   PT Start Time 1100      Past Medical History:  Diagnosis Date  . Angina    occasional chest pain  never been treated for this ...   . Arthritis   . BPH (benign prostatic hyperplasia)   . COPD (chronic obstructive pulmonary disease) ([place])   . CVA (cerebral infarction)   . Diverticulosis   . Dyslipidemia   . Family history of coronary artery disease    brothers, sisters  . History of nuclear stress test 03/2010   dobutamine; mild ischemia in mid inferior and apical inferior regions; ekg negative for ischemia; low risk scan   . Hypertension   . Kidney stones    10-15 yrs ago  . Neuromuscular disorder ([place])   . Shortness of breath    with exertion   . Sleep apnea    uses cpap  . Smoker     Past Surgical History:  Procedure Laterality Date  . ANTERIOR CERVICAL DECOMP/DISCECTOMY FUSION  10/06/2011   Procedure: ANTERIOR CERVICAL DECOMPRESSION/DISCECTOMY FUSION 3 LEVELS;  Surgeon: Faythe Ghee, MD;  Location: [facility];  Service: Neurosurgery;  Laterality: Bilateral;  Cervical three-four, four-five, five-six anterior cervical decompression, fusion  . CARDIAC CATHETERIZATION  10/2010   small bridging segment in LAD  . TONSILLECTOMY  age 7  . TRANSTHORACIC ECHOCARDIOGRAM  2004   normal LV size and function, aortic root dilated  . TRANSURETHRAL RESECTION OF PROSTATE  08/1996  . TRANSURETHRAL RESECTION OF PROSTATE     long time ago per pt.  10-15 yrs    There were no vitals filed for this visit.       Subjective Assessment - 07/16/16 1102    Subjective I did some of my exercises over Christmas   Pertinent History COPD, HTN, chronic respiratory failure with hypoxia  ACDF C-3/4,4/5  5/6 in 2013, several TIA's   Limitations Standing;Walking;House hold activities   How long can you sit comfortably? unlimited but I am a little stiff   How long can you stand comfortably? 10-15   How long can you walk comfortably? 10-15   Diagnostic tests x ray low back   Patient Stated Goals I would like to stand for 30 minutes to do stuff around the house and walk without a cane   Currently in Pain? Yes   Pain Score 2    Pain Location Knee   Pain Orientation Right;Left   Pain Descriptors / Indicators Aching   Multiple Pain Sites Yes   Pain Score 0   Pain Location Back            [facility] PT Assessment - 07/16/16 1135      Observation/Other Assessments   Focus on Therapeutic Outcomes (FOTO)  FOTO intake 44% limitation 56%  predicted 53%     Strength   Overall Strength Comments abdominal 3/5   Right Hip Flexion 4/5   Right Hip Extension 3-/5   Right Hip ABduction 3-/5   Left Hip Flexion 4-/5   Left Hip  Extension 3-/5   Left Hip ABduction 4-/5                     [facility] Adult PT Treatment/Exercise - 07/16/16 1130      Lumbar Exercises: Stretches   Passive Hamstring Stretch 3 reps;30 seconds   Passive Hamstring Stretch Limitations use of green strap   Lower Trunk Rotation 3 reps;20 seconds  bil     Lumbar Exercises: Aerobic   Stationary Bike L5 6 minutes     Lumbar Exercises: Standing   Other Standing Lumbar Exercises 4 way SLR x 10 with add, abd, flex ( marching) and extension holding onto counter bil     Lumbar Exercises: Supine   Glut Set 10 reps   Glut Set Limitations pelvic tilt   Bent Knee Raise 20 reps   Bent Knee Raise Limitations with ab set    Bridge 10 reps     Knee/Hip Exercises: Standing   Heel Raises 10 reps   Heel Raises Limitations m0derate use of  hands  toe lifts, both   Hip Abduction 10 reps;2 sets   Abduction Limitations yellow band 1 hand     Knee/Hip Exercises: Seated   Hamstring Curl 3 sets;10 reps   Hamstring Limitations red band, both     Ankle Exercises: Stretches   Gastroc Stretch 30 seconds;4 reps   Gastroc Stretch Limitations bil 2 standing at counter and two seated                PT Education - 07/16/16 1115    Education provided Yes   Education Details 4 way SLR and gastroc stretches for hip strength   Person(s) Educated Patient   Methods Explanation;Demonstration;Verbal cues;Handout   Comprehension Verbalized understanding;Returned demonstration             PT Long Term Goals - 07/16/16 1710      PT LONG TERM GOAL #1   Title "Independent with advanced HEP   Baseline benefits from cues for correct execution of exercises   Time 6   Period Weeks   Status On-going     PT LONG TERM GOAL #2   Title "Pain will decrease to  3/10 or less  with all functional activities   Baseline Pt back 0/10 and 2/10 in knee   Time 6   Period Weeks   Status Achieved     PT LONG TERM GOAL #3   Title "FOTO will improve from 64% limitation   to  40% limitation   indicating improved functional mobility    Baseline FOTO 56%     Time 6   Period Weeks   Status On-going     PT LONG TERM GOAL #4   Title Improve gait velocity from 2.15ft.sec to at least 2.13ft/sec which is limited communtiy ambulator with LRAD   Time 6   Period Weeks   Status Unable to assess     PT LONG TERM GOAL #5   Title "Demonstrate and verbalize techniques to reduce the risk of re-injury including: lifting, posture, body mechanics.    Time 6   Period Weeks   Status On-going     PT LONG TERM GOAL #6   Title "Pt will improve bil knee hip strength to >/= 4/5 with </= 2/10 pain in back  to promote safety with walking/standing activities   Baseline hip core strength 3-/5  4-/5   Time 6   Period Weeks   Status On-going  Plan - 07-25-2016 1101    Clinical Impression Statement Pt enters clinic with 0/10 pain in back and 2/10 pain in knees.  He believes exercises are making a difference even though he at. times is inconsistent.   Pt will benefit from continued PT to address core and hip weakness especially on Right hip.  FOTO improved from 64% limitation to 56%   Rehab Potential Good   PT Frequency 2x / week   PT Duration 6 weeks   PT Treatment/Interventions ADLs/Self Care Home Management;Cryotherapy;Electrical Stimulation;Iontophoresis 4mg /ml Dexamethasone;Moist Heat;Traction;Ultrasound;Gait training;Stair training;Therapeutic exercise;Neuromuscular re-education;Balance training;Patient/family education;Manual techniques;Dry needling;Taping   PT Next Visit Plan Continue stabilization exercises Begin Closed chain.     PT Home Exercise Plan trunk rotation, single limb and double limb to chest, clam, bridge with DF, SLR with core brace calf stretch   Consulted and Agree with Plan of Care Patient      Patient will benefit from skilled therapeutic intervention in order to improve the following deficits and impairments:     Visit Diagnosis: Chronic midline low back pain with sciatica, sciatica laterality unspecified  Muscle weakness (generalized)  Abnormal posture  Unsteadiness on feet       G-Codes - 25-Jul-2016 1709    Functional Assessment Tool Used FOTO   Functional Limitation Mobility: Walking and moving around   Mobility: Walking and Moving Around Current Status [phone]) At least 40% but less than 60% impaired, liimted or restricted 56%   Mobility: Walking and Moving Around Goal Status [phone]) At least 20 percent but less than 40 percent impaired, limited or restricted      Problem List Patient Active Problem List   Diagnosis Date Noted  . Chronic back pain 06/09/2016  . Hearing loss due to cerumen impaction 12/06/2015  . Congestive heart failure ([place]) 10/19/2014  . OSA on CPAP  10/18/2014  . Leukocytosis 10/09/2014  . Chronic respiratory failure with hypoxia ([place]) 03/29/2014  . Coronary artery spasm ([place]) 03/29/2013  . Hypertension 03/26/2011    Voncille Lo, PT Exercise Expert for the Aging Adult  2016/07/25 5:23 PM Phone: [phone] Fax: [place] [address] [place], [place], [address] Phone: [phone]   Fax:  [phone]  Name: JERONE BOURLIER MRN: [id number] Date of Birth: January 11, 1935

## 2016-07-16 NOTE — Patient Instructions (Signed)
   SEATED Soleus Stretch: ANKLE: Dorsiflexion - Sitting   Sitting, place strap around foot. Pull foot toward body, keeping heel on floor. Keep foot straight. Hold _30__ seconds. _3__ reps per set, __3_ sets per day, _7__ days per week  Achilles / Gastroc, Standing   Stand, right foot behind, heel on floor and turned slightly out, leg straight, forward leg bent. Move hips forward. Hold _30__ seconds. Repeat _3__ times per session. Do _3__ sessions per day.   Knee High   Holding stable object, raise knee to hip level, then lower knee. Repeat with other knee. Complete __10_ repetitions. Do __2__ sessions per day.  ABDUCTION: Standing (Active)   Stand, feet flat. Lift right leg out to side. Use _0__ lbs. Complete __10_ repetitions. Perform __2_ sessions per day.  ADDUCTION: Standing - Stable (Active)   Stand, right leg out to side as far as possible. Draw leg in across midline. Use _0__ lbs. Complete 10_ repetitions. Perform _2__ sessions per day.       EXTENSION: Standing (Active)  Stand, both feet flat. Draw right leg behind body as far as possible. Use 0___ lbs. Complete 10 repetitions. Perform __2_ sessions per day.  Copyright  VHI. All rights reserved.   Voncille Lo, PT Exercise Expert for the Aging Adult  07/16/16 11:15 AM Phone: [phone] Fax: [phone]

## 2016-07-21 ENCOUNTER — Ambulatory Visit: Payer: Medicare Other | Attending: Family Medicine | Admitting: Physical Therapy

## 2016-07-21 DIAGNOSIS — M6281 Muscle weakness (generalized): Secondary | ICD-10-CM | POA: Diagnosis not present

## 2016-07-21 DIAGNOSIS — G8929 Other chronic pain: Secondary | ICD-10-CM | POA: Diagnosis not present

## 2016-07-21 DIAGNOSIS — R2681 Unsteadiness on feet: Secondary | ICD-10-CM | POA: Diagnosis not present

## 2016-07-21 DIAGNOSIS — R293 Abnormal posture: Secondary | ICD-10-CM | POA: Diagnosis not present

## 2016-07-21 DIAGNOSIS — M5440 Lumbago with sciatica, unspecified side: Secondary | ICD-10-CM | POA: Diagnosis not present

## 2016-07-21 NOTE — Patient Instructions (Signed)
Knee High   Holding stable object, raise knee to hip level, then lower knee. Repeat with other knee. Complete __10_ repetitions. Do __2__ sessions per day.  ABDUCTION: Standing (Active)   Stand, feet flat. Lift right leg out to side. Use _0__ lbs. Complete __10_ repetitions. Perform __2_ sessions per day.   EXTENSION: Standing (Active)  Stand, both feet flat. Draw right leg behind body as far as possible. Use 0___ lbs. Complete 10 repetitions. Perform __2_ sessions per day.

## 2016-07-21 NOTE — Therapy (Signed)
[place], [place], [address] Phone: [phone]   Fax:  [phone]  Physical Therapy Treatment  Patient Details  Name: Curtis Riley MRN: [id number] Date of Birth: 1934/10/30 Referring Provider: Jill Alexanders MD  Encounter Date: 07/21/2016      PT End of Session - 07/21/16 1056    Visit Number 9   Number of Visits 12   Date for PT Re-Evaluation 07/30/16   Authorization Type Medicare   PT Start Time 1053   PT Stop Time 1143   PT Time Calculation (min) 50 min      Past Medical History:  Diagnosis Date  . Angina    occasional chest pain  never been treated for this ...   . Arthritis   . BPH (benign prostatic hyperplasia)   . COPD (chronic obstructive pulmonary disease) ([place])   . CVA (cerebral infarction)   . Diverticulosis   . Dyslipidemia   . Family history of coronary artery disease    brothers, sisters  . History of nuclear stress test 03/2010   dobutamine; mild ischemia in mid inferior and apical inferior regions; ekg negative for ischemia; low risk scan   . Hypertension   . Kidney stones    10-15 yrs ago  . Neuromuscular disorder ([place])   . Shortness of breath    with exertion   . Sleep apnea    uses cpap  . Smoker     Past Surgical History:  Procedure Laterality Date  . ANTERIOR CERVICAL DECOMP/DISCECTOMY FUSION  10/06/2011   Procedure: ANTERIOR CERVICAL DECOMPRESSION/DISCECTOMY FUSION 3 LEVELS;  Surgeon: Faythe Ghee, MD;  Location: [facility];  Service: Neurosurgery;  Laterality: Bilateral;  Cervical three-four, four-five, five-six anterior cervical decompression, fusion  . CARDIAC CATHETERIZATION  10/2010   small bridging segment in LAD  . TONSILLECTOMY  age 74  . TRANSTHORACIC ECHOCARDIOGRAM  2004   normal LV size and function, aortic root dilated  . TRANSURETHRAL RESECTION OF PROSTATE  08/1996  . TRANSURETHRAL RESECTION OF PROSTATE     long time ago per pt.  10-15 yrs     There were no vitals filed for this visit.      Subjective Assessment - 07/21/16 1058    Subjective Since I started PT I can stand longer.    Currently in Pain? Yes   Pain Score 3    Pain Location Knee   Pain Orientation Left;Right   Pain Radiating Towards lower legs    Pain Score 0   Pain Location Back                         [facility] Adult PT Treatment/Exercise - 07/21/16 0001      Ambulation/Gait   Ambulation Distance (Feet) 43 Feet  14 sec   Assistive device Straight cane   Gait velocity 3.07 ft/sec     Lumbar Exercises: Standing   Other Standing Lumbar Exercises 4 way SLR x 10 with add, abd, flex ( marching) and extension holding onto counter bil     Lumbar Exercises: Supine   Glut Set 20 reps   Glut Set Limitations pelvic tilt , requires mod cues   Clam 20 reps   Clam Limitations blue band   Heel Slides 10 reps   Heel Slides Limitations with ab set    Bent Knee Raise 20 reps   Bent Knee Raise Limitations with ab set    Bridge 10 reps  Bridge Limitations then with clam x 10    Straight Leg Raise 10 reps   Straight Leg Raises Limitations with ab draw in     Knee/Hip Exercises: Standing   Heel Raises 20 reps   Heel Raises Limitations and toe raises      Ankle Exercises: Stretches   Gastroc Stretch 30 seconds;4 reps                PT Education - 07/21/16 1151    Education provided Yes   Education Details Importance of HEP, end of POC   Person(s) Educated Patient   Methods Explanation;Handout   Comprehension Verbalized understanding             PT Long Term Goals - 07/21/16 1110      PT LONG TERM GOAL #1   Title "Independent with advanced HEP   Baseline benefits from cues for correct execution of exercises   Time 6   Period Weeks   Status On-going     PT LONG TERM GOAL #2   Title "Pain will decrease to  3/10 or less  with all functional activities   Baseline Pt back 0/10 and 2/10 in knee, increases after prolonged  standing more than 15 minutes   Time 6   Period Weeks   Status Achieved     PT LONG TERM GOAL #3   Title "FOTO will improve from 64% limitation   to  40% limitation   indicating improved functional mobility    Baseline FOTO 56%     Time 6   Period Weeks   Status On-going     PT LONG TERM GOAL #4   Title Improve gait velocity from 2.83f.sec to at least 2.636fsec which is limited communtiy ambulator with LRAD   Baseline 3.07 ft/sec   Time 6   Period Weeks   Status Achieved     PT LONG TERM GOAL #5   Title "Demonstrate and verbalize techniques to reduce the risk of re-injury including: lifting, posture, body mechanics.    Time 6   Period Weeks   Status On-going     PT LONG TERM GOAL #6   Title "Pt will improve bil knee hip strength to >/= 4/5 with </= 2/10 pain in back  to promote safety with walking/standing activities   Baseline hip core strength 3-/5  4-/5   Time 6   Period Weeks   Status On-going               Plan - 07/21/16 1059    Clinical Impression Statement Able to stand 10-15 minutes without limitation by back pain. On initial eval, limited to 5 minutes at most. Gait speed has improved from 2.05 ft/sec to 3.07 ft /sec. LTG# 4 Met.    PT Next Visit Plan Continue stabilization exercises; review closed chain, reinforce HEP, 3 more visits.    PT Home Exercise Plan trunk rotation, single limb and double limb to chest, clam, bridge with DF, SLR with core brace calf stretch, standing 3 way hip, gastroc/hip flexor stretch in standing    Consulted and Agree with Plan of Care Patient      Patient will benefit from skilled therapeutic intervention in order to improve the following deficits and impairments:  Cardiopulmonary status limiting activity, Decreased balance, Decreased mobility, Decreased strength, Postural dysfunction, Improper body mechanics, Pain, Obesity, Decreased range of motion, Abnormal gait, Hypomobility  Visit Diagnosis: Chronic midline low back  pain with sciatica, sciatica laterality unspecified  Muscle  weakness (generalized)  Abnormal posture  Unsteadiness on feet     Problem List Patient Active Problem List   Diagnosis Date Noted  . Chronic back pain 06/09/2016  . Hearing loss due to cerumen impaction 12/06/2015  . Congestive heart failure ([place]) 10/19/2014  . OSA on CPAP 10/18/2014  . Leukocytosis 10/09/2014  . Chronic respiratory failure with hypoxia ([place]) 03/29/2014  . Coronary artery spasm ([place]) 03/29/2013  . Hypertension 03/26/2011    Dorene Ar, PTA 07/21/2016, 11:55 AM  [facility] [address] [place], [place], [address] Phone: [phone]   Fax:  [phone]  Name: Curtis Riley MRN: [id number] Date of Birth: 1934-10-15

## 2016-07-23 ENCOUNTER — Ambulatory Visit: Payer: Medicare Other | Admitting: Physical Therapy

## 2016-07-27 ENCOUNTER — Other Ambulatory Visit: Payer: Self-pay | Admitting: Family Medicine

## 2016-07-28 ENCOUNTER — Ambulatory Visit: Payer: Medicare Other | Admitting: Physical Therapy

## 2016-07-28 DIAGNOSIS — M6281 Muscle weakness (generalized): Secondary | ICD-10-CM

## 2016-07-28 DIAGNOSIS — M5440 Lumbago with sciatica, unspecified side: Secondary | ICD-10-CM | POA: Diagnosis not present

## 2016-07-28 DIAGNOSIS — R2681 Unsteadiness on feet: Secondary | ICD-10-CM | POA: Diagnosis not present

## 2016-07-28 DIAGNOSIS — R293 Abnormal posture: Secondary | ICD-10-CM | POA: Diagnosis not present

## 2016-07-28 DIAGNOSIS — G8929 Other chronic pain: Secondary | ICD-10-CM | POA: Diagnosis not present

## 2016-07-28 NOTE — Telephone Encounter (Signed)
Is this ok to refill?  

## 2016-07-28 NOTE — Therapy (Signed)
[place], [place], [address] Phone: [phone]   Fax:  [phone]  Physical Therapy Treatment  Patient Details  Name: Curtis Riley MRN: [id number] Date of Birth: 10-13-1934 Referring Provider: Jill Alexanders MD  Encounter Date: 07/28/2016      PT End of Session - 07/28/16 1103    Visit Number 10   Number of Visits 12   Date for PT Re-Evaluation 07/30/16   Authorization Type Medicare   PT Start Time 1101   PT Stop Time 1145   PT Time Calculation (min) 44 min   Activity Tolerance Patient tolerated treatment well   Behavior During Therapy [facility] for tasks assessed/performed      Past Medical History:  Diagnosis Date  . Angina    occasional chest pain  never been treated for this ...   . Arthritis   . BPH (benign prostatic hyperplasia)   . COPD (chronic obstructive pulmonary disease) ([place])   . CVA (cerebral infarction)   . Diverticulosis   . Dyslipidemia   . Family history of coronary artery disease    brothers, sisters  . History of nuclear stress test 03/2010   dobutamine; mild ischemia in mid inferior and apical inferior regions; ekg negative for ischemia; low risk scan   . Hypertension   . Kidney stones    10-15 yrs ago  . Neuromuscular disorder ([place])   . Shortness of breath    with exertion   . Sleep apnea    uses cpap  . Smoker     Past Surgical History:  Procedure Laterality Date  . ANTERIOR CERVICAL DECOMP/DISCECTOMY FUSION  10/06/2011   Procedure: ANTERIOR CERVICAL DECOMPRESSION/DISCECTOMY FUSION 3 LEVELS;  Surgeon: Faythe Ghee, MD;  Location: [facility];  Service: Neurosurgery;  Laterality: Bilateral;  Cervical three-four, four-five, five-six anterior cervical decompression, fusion  . CARDIAC CATHETERIZATION  10/2010   small bridging segment in LAD  . TONSILLECTOMY  age 82  . TRANSTHORACIC ECHOCARDIOGRAM  2004   normal LV size and function, aortic root dilated  .  TRANSURETHRAL RESECTION OF PROSTATE  08/1996  . TRANSURETHRAL RESECTION OF PROSTATE     long time ago per pt.  10-15 yrs    There were no vitals filed for this visit.      Subjective Assessment - 07/28/16 1107    Subjective I know I am better.  I can walk longer and I can stand longer.    Pertinent History COPD, HTN, chronic respiratory failure with hypoxia  ACDF C-3/4,4/5  5/6 in 2013, several TIA's   Limitations Standing;Walking;House hold activities   How long can you sit comfortably? unlimited but I am a little stiff   How long can you stand comfortably? 15 min no problem at all now   How long can you walk comfortably? 15 minu no problem at all now   Diagnostic tests x ray low back   Patient Stated Goals I would like to stand for 30 minutes to do stuff around the house and walk without a cane   Currently in Pain? Yes   Pain Score 2    Pain Location Knee   Pain Orientation Right;Left   Pain Descriptors / Indicators Aching   Pain Type Chronic pain   Pain Onset More than a month ago   Pain Score 0   Pain Location Back            [facility] PT Assessment - 07/28/16 1109  Observation/Other Assessments   Focus on Therapeutic Outcomes (FOTO)  --     Not taken but sent via email for 11th visit                [facility] Adult PT Treatment/Exercise - 07/28/16 1113      Lumbar Exercises: Stretches   Passive Hamstring Stretch 3 reps;30 seconds  sitting    Passive Hamstring Stretch Limitations use of green strap   Lower Trunk Rotation 3 reps;20 seconds  bil     Lumbar Exercises: Aerobic   Stationary Bike L5 52minutes       Lumbar Exercises: Standing   Other Standing Lumbar Exercises --   Other Standing Lumbar Exercises sit ot stand x 9  using ther ex pad on mat to elevate.     Lumbar Exercises: Supine   Glut Set 20 reps   Glut Set Limitations pelvic tilt , requires mod cues   Clam 20 reps   Clam Limitations blue band   Heel Slides 10 reps   Heel Slides  Limitations with ab set    Bent Knee Raise 20 reps   Bent Knee Raise Limitations with ab set    Bridge 10 reps   Bridge Limitations then with clam x 10    Straight Leg Raise 10 reps  with 2 lb weights   Straight Leg Raises Limitations with ab draw in     Knee/Hip Exercises: Standing   Heel Raises 20 reps   Heel Raises Limitations and toe raises   at counter   Lateral Step Up 15 reps;Hand Hold: 2;Step Height: 6"   Lateral Step Up Limitations bil no pain   Forward Step Up 15 reps;Step Height: 6";Hand Hold: 2   Forward Step Up Limitations bil no pain     Ankle Exercises: Stretches   Development worker, international aid Limitations bil 2 standing at counter and two seated                PT Education - 07/28/16 1120    Education provided Yes   Education Details Added sit to stand to HEP and knee step ups laterally and forward with UE assist for safety   Person(s) Educated Patient   Methods Explanation;Handout;Demonstration   Comprehension Verbalized understanding;Returned demonstration             PT Long Term Goals - 07/21/16 1110      PT LONG TERM GOAL #1   Title "Independent with advanced HEP   Baseline 80 benefits from cues for correct execution of exercises   Time 6   Period Weeks   Status On-going     PT LONG TERM GOAL #2   Title "Pain will decrease to  3/10 or less  with all functional activities   Baseline Pt back 0/10 and 2/10 in knee, increases after prolonged standing more than 15 minutes   Time 6   Period Weeks   Status Achieved     PT LONG TERM GOAL #3   Title "FOTO will improve from 64% limitation   to  40% limitation   indicating improved functional mobility    Baseline FOTO 56%     Time 6   Period Weeks   Status On-going     PT LONG TERM GOAL #4   Title Improve gait velocity from 2.70ft.sec to at least 2.68ft/sec which is limited communtiy ambulator with LRAD   Baseline 3.07 ft/sec   Time 6   Period Weeks   Status  Achieved     PT  LONG TERM GOAL #5   Title "Demonstrate and verbalize techniques to reduce the risk of re-injury including: lifting, posture, body mechanics.    Time 6   Period Weeks   Status On-going     PT LONG TERM GOAL #6   Title "Pt will improve bil knee hip strength to >/= 4/5 with </= 2/10 pain in back  to promote safety with walking/standing activities   Baseline hip core strength 3-/5  4-/5   Time 6   Period Weeks   Status On-going               Plan - 07/28/16 1111    Clinical Impression Statement able to stand 15 minutes without limitation by back pain.  Pt is glad he can walk longer and better and faster.  Pt able to utilize 2lb wts for SLR and step ups with body weight on steps.   PT Frequency 2x / week   PT Duration 6 weeks   PT Treatment/Interventions ADLs/Self Care Home Management;Cryotherapy;Electrical Stimulation;Iontophoresis 4mg /ml Dexamethasone;Moist Heat;Traction;Ultrasound;Gait training;Stair training;Therapeutic exercise;Neuromuscular re-education;Balance training;Patient/family education;Manual techniques;Dry needling;Taping   PT Next Visit Plan Continue stabilization exercises; review closed chain, reinforce HEP, DC next visit  REinforce HEP FOTO sent for 11th visit by email   PT Home Exercise Plan trunk rotation, single limb and double limb to chest, clam, bridge with DF, SLR with core brace calf stretch, standing 3 way hip, gastroc/hip flexor stretch in standing    Consulted and Agree with Plan of Care Patient      Patient will benefit from skilled therapeutic intervention in order to improve the following deficits and impairments:  Cardiopulmonary status limiting activity, Decreased balance, Decreased mobility, Decreased strength, Postural dysfunction, Improper body mechanics, Pain, Obesity, Decreased range of motion, Abnormal gait, Hypomobility  Visit Diagnosis: Chronic midline low back pain with sciatica, sciatica laterality unspecified  Muscle weakness  (generalized)  Abnormal posture  Unsteadiness on feet     Problem List Patient Active Problem List   Diagnosis Date Noted  . Chronic back pain 06/09/2016  . Hearing loss due to cerumen impaction 12/06/2015  . Congestive heart failure ([place]) 10/19/2014  . OSA on CPAP 10/18/2014  . Leukocytosis 10/09/2014  . Chronic respiratory failure with hypoxia ([place]) 03/29/2014  . Coronary artery spasm ([place]) 03/29/2013  . Hypertension 03/26/2011   Voncille Lo, PT Exercise Expert for the Aging Adult  07/28/16 11:53 AM Phone: [phone] Fax: [facility] [address] [place], [place], [address] Phone: [phone]   Fax:  [phone]  Name: ROYALTY REINHARDT MRN: [id number] Date of Birth: 06-03-1935

## 2016-07-28 NOTE — Patient Instructions (Addendum)
Stand to Sit / Sit to Stand     Copyright  VHI. All rights reserved.   Use a chair in front of your to hold onto and pretend to sit down. All the way and just before you make total contact with seat come back up to standing.  Try 5 x at a time and progress to 10 x and 15 x.    Step Down: Anterior   Copyright  VHI. All rights reserved.  Step-Up: Lateral   Step up to side with right leg. Bring other foot up onto _6___ inch step. Return to floor position with left leg. Repeat _15___ times per session. Do __1__ sessions per day. Do both legs. Repeat in dimly lit room. Repeat with eyes closed.  Copyright  VHI. All rights reserved.  Forward   Facing step, place one leg on step, flexed at hip. Step up slowly, bringing hips in line with knee and shoulder. Bring other foot onto step. Reverse process to step back down. Repeat with other leg. Do _15___ repetitions, __1__ sets. Both legs  http://bt.exer.us/154   Copyright  VHI. All rights reserved.    Curtis Riley, PT Exercise Expert for the Aging Adult  07/28/16 11:20 AM Phone: [phone] Fax: [phone]

## 2016-07-30 ENCOUNTER — Ambulatory Visit: Payer: Medicare Other | Admitting: Physical Therapy

## 2016-07-30 DIAGNOSIS — R293 Abnormal posture: Secondary | ICD-10-CM | POA: Diagnosis not present

## 2016-07-30 DIAGNOSIS — M6281 Muscle weakness (generalized): Secondary | ICD-10-CM | POA: Diagnosis not present

## 2016-07-30 DIAGNOSIS — G8929 Other chronic pain: Secondary | ICD-10-CM

## 2016-07-30 DIAGNOSIS — M5440 Lumbago with sciatica, unspecified side: Principal | ICD-10-CM

## 2016-07-30 DIAGNOSIS — R2681 Unsteadiness on feet: Secondary | ICD-10-CM

## 2016-07-30 NOTE — Therapy (Signed)
[place], [place], [address] Phone: [phone]   Fax:  [phone]  Physical Therapy Treatment/Discharge Note  Patient Details  Name: Curtis Riley MRN: [id number] Date of Birth: September 03, 1934 Referring Provider: Jill Alexanders MD  Encounter Date: 07/30/2016      PT End of Session - 07/30/16 1114    Visit Number 11   Number of Visits 12   Date for PT Re-Evaluation 07/30/16   Authorization Type Medicare   PT Start Time 1101   PT Stop Time 1145   PT Time Calculation (min) 44 min   Activity Tolerance Patient tolerated treatment well   Behavior During Therapy [facility] for tasks assessed/performed      Past Medical History:  Diagnosis Date  . Angina    occasional chest pain  never been treated for this ...   . Arthritis   . BPH (benign prostatic hyperplasia)   . COPD (chronic obstructive pulmonary disease) ([place])   . CVA (cerebral infarction)   . Diverticulosis   . Dyslipidemia   . Family history of coronary artery disease    brothers, sisters  . History of nuclear stress test 03/2010   dobutamine; mild ischemia in mid inferior and apical inferior regions; ekg negative for ischemia; low risk scan   . Hypertension   . Kidney stones    10-15 yrs ago  . Neuromuscular disorder ([place])   . Shortness of breath    with exertion   . Sleep apnea    uses cpap  . Smoker     Past Surgical History:  Procedure Laterality Date  . ANTERIOR CERVICAL DECOMP/DISCECTOMY FUSION  10/06/2011   Procedure: ANTERIOR CERVICAL DECOMPRESSION/DISCECTOMY FUSION 3 LEVELS;  Surgeon: Faythe Ghee, MD;  Location: [facility];  Service: Neurosurgery;  Laterality: Bilateral;  Cervical three-four, four-five, five-six anterior cervical decompression, fusion  . CARDIAC CATHETERIZATION  10/2010   small bridging segment in LAD  . TONSILLECTOMY  age 64  . TRANSTHORACIC ECHOCARDIOGRAM  2004   normal LV size and function, aortic root  dilated  . TRANSURETHRAL RESECTION OF PROSTATE  08/1996  . TRANSURETHRAL RESECTION OF PROSTATE     long time ago per pt.  10-15 yrs    There were no vitals filed for this visit.      Subjective Assessment - 07/30/16 1109    Subjective I know I am better ,, Today I am stiff due to the weather but overall I am better   Pertinent History COPD, HTN, chronic respiratory failure with hypoxia  ACDF C-3/4,4/5  5/6 in 2013, several TIA's   Limitations Standing;Walking;House hold activities   How long can you sit comfortably? unlimited but I am a little stiff   How long can you stand comfortably? 15 -20 min no problem at all now   How long can you walk comfortably? 15 -20  min no problem at all now   Diagnostic tests x ray low back   Patient Stated Goals I would like to stand for 30 minutes to do stuff around the house and walk without a cane   Currently in Pain? Yes   Pain Score 2    Pain Location Knee   Pain Orientation Right;Left   Pain Score 0   Pain Location Back            [facility] PT Assessment - 07/30/16 1127      Strength   Overall Strength Comments abdominal 3+/5   Right Hip Flexion  4/5   Right Hip Extension 3-/5   Right Hip ABduction 3-/5   Left Hip Flexion 4-/5   Left Hip Extension 3+/5   Left Hip ABduction 4-/5     Ambulation/Gait   Ambulation Distance (Feet) 20 Feet   Assistive device Straight cane   Gait Pattern Step-through pattern   Ambulation Surface Level   Gait velocity 3.15 ft/sec                     [facility] Adult PT Treatment/Exercise - 07/30/16 1111      Lumbar Exercises: Stretches   Passive Hamstring Stretch --   Passive Hamstring Stretch Limitations --   Lower Trunk Rotation --     Lumbar Exercises: Aerobic   Stationary Bike L6 6 minutes       Lumbar Exercises: Standing   Other Standing Lumbar Exercises 4 way SLR x 10 with add, abd, flex ( marching) and extension holding onto counter bil   Other Standing Lumbar Exercises sit ot  stand x 10  using ther ex pad on mat to elevate.     Lumbar Exercises: Supine   Glut Set 20 reps   Glut Set Limitations pelvic tilt , requires mod cues   Clam 20 reps   Clam Limitations blue band   Heel Slides 10 reps   Heel Slides Limitations with ab set    Bent Knee Raise 20 reps   Bent Knee Raise Limitations with ab set    Bridge 10 reps   Bridge Limitations then with clam x 10 and with ball squeeze   Straight Leg Raise 10 reps  with 2 lb weights   Straight Leg Raises Limitations with ab draw in     Knee/Hip Exercises: Standing   Heel Raises 20 reps   Heel Raises Limitations and toe raises   at counter   Lateral Step Up --   Lateral Step Up Limitations --   Forward Step Up --   Forward Step Up Limitations --     Ankle Exercises: Stretches   Gastroc Stretch Limitations bil 2 standing at counter and two seated                PT Education - 07/30/16 1204    Education provided Yes   Education Details discussed community wellness benefit and encouraged group silver sneakers.  Reviewed HEP   Person(s) Educated Patient   Methods Explanation;Demonstration   Comprehension Verbalized understanding;Returned demonstration             PT Long Term Goals - 07/30/16 1123      PT LONG TERM GOAL #1   Title "Independent with advanced HEP   Time 6   Period Weeks   Status Achieved     PT LONG TERM GOAL #2   Title "Pain will decrease to  3/10 or less  with all functional activities   Baseline Pt back 0/10 and 2/10 in knee, increases after prolonged standing more than 15 minutes to 20 minutes   Time 6   Period Weeks   Status Achieved     PT LONG TERM GOAL #3   Title "FOTO will improve from 64% limitation   to  40% limitation   indicating improved functional mobility    Baseline FOTO predicted limtation was 56%,  actual was 46% from intake 64% limitation   Time 6   Period Weeks   Status Partially Met     PT LONG TERM GOAL #4   Title  Improve gait velocity from  2.63f.sec to at least 2.654fsec which is limited communtiy ambulator with LRAD   Baseline 3.07 ft/sec   Time 6   Period Weeks   Status Achieved     PT LONG TERM GOAL #5   Title "Demonstrate and verbalize techniques to reduce the risk of re-injury including: lifting, posture, body mechanics.    Time 6   Period Weeks   Status Achieved     PT LONG TERM GOAL #6   Title "Pt will improve bil knee hip strength to >/= 4/5 with </= 2/10 pain in back  to promote safety with walking/standing activities   Baseline hip core strength 3-/5  to 4/5 and no pain in back   Time 6   Period Weeks   Status Partially Met               Plan - [id number]    Clinical Impression Statement Pt has completed back exercises program with independence. and will continues community wellness after DC.  Pt still with Hip extension/hip abduction weakness but improved from evaluation.  Pt core abdominal strength improved from 3-/5 to 3+/5.  Pt gait velocity has improved to 3.15 ft/sec. for community ambulation with cane. Pt has partially met or achieved all goals.  Pt back pain is 0/10 and knee is 2/10 depending on weather according to patient.  Pt is pleased with current progress and is ready for DC   Rehab Potential Good   PT Frequency 2x / week   PT Duration 6 weeks   PT Treatment/Interventions ADLs/Self Care Home Management;Cryotherapy;Electrical Stimulation;Iontophoresis 52m43ml Dexamethasone;Moist Heat;Traction;Ultrasound;Gait training;Stair training;Therapeutic exercise;Neuromuscular re-education;Balance training;Patient/family education;Manual techniques;Dry needling;Taping   PT Next Visit Plan DC   PT Home Exercise Plan trunk rotation, single limb and double limb to chest, clam, bridge with DF, SLR with core brace calf stretch, standing 3 way hip, gastroc/hip flexor stretch in standing bridging, SLRx 4 standing at counter continueing community wellness   Consulted and Agree with Plan of Care Patient       Patient will benefit from skilled therapeutic intervention in order to improve the following deficits and impairments:  Cardiopulmonary status limiting activity, Decreased balance, Decreased mobility, Decreased strength, Postural dysfunction, Improper body mechanics, Pain, Obesity, Decreased range of motion, Abnormal gait, Hypomobility  Visit Diagnosis: Chronic midline low back pain with sciatica, sciatica laterality unspecified  Muscle weakness (generalized)  Abnormal posture  Unsteadiness on feet       G-Codes - 07/21/08/1816    Functional Assessment Tool Used FOTO   Functional Limitation Mobility: Walking and moving around   Mobility: Walking and Moving Around Current Status ([id number]   Mobility: Walking and Moving Around Goal Status ([id number] least 20 percent but less than 40 percent impaired, limited or restricted   Mobility: Walking and Moving Around Discharge Status ([phone] least 40 percent but less than 60 percent impaired, limited or restricted  46% from 64% on eval      Problem List Patient Active Problem List   Diagnosis Date Noted  . Chronic back pain 06/09/2016  . Hearing loss due to cerumen impaction 12/06/2015  . Congestive heart failure ([place]/07/2014  . OSA on CPAP 10/18/2014  . Leukocytosis 10/09/2014  . Chronic [place] with hypoxia ([place]/04/2014  . [place] spasm ([place]/04/2013  . Hypertension 03/26/2011   LawVoncille LoT Exercise Expert for the Aging Adult  01/Aug 29, 2016:15 PM Phone: [phone]: [phone]  [place], [place], [address] Phone: [phone]   Fax:  [phone]  Name: Curtis Riley MRN: [id number] Date of Birth: 1935-02-03   PHYSICAL THERAPY DISCHARGE SUMMARY  Visits from Start of Care: 11  Current functional level related to goals / functional outcomes: As above. Pt ambulates with a cane.  Can ambulate at  3.15 ft/sec   0/10 pain in back  Knee fluctuates with weather according to pt.,   2/10 most of time    Remaining deficits: As above,  See flowsheet for MMT. Pt with bil hip ext/hip abd weakness 3-/5 to 3+/5   Education / Equipment: Advanced HEP and going to participate in community wellness Plan: Patient agrees to discharge.  Patient goals were partially met. Patient is being discharged due to meeting the stated rehab goals.  ????? and being pleased with current functional level.         Voncille Lo, PT Exercise Expert for the Aging Adult  07/30/16 12:18 PM Phone: [phone] Fax: [phone]

## 2016-08-24 ENCOUNTER — Telehealth: Payer: Self-pay | Admitting: Family Medicine

## 2016-08-24 NOTE — Telephone Encounter (Signed)
Pt requesting information or resources on how to get info about the Silver Sneakers program. He recently completed physical therapy and was advised at therapy to enroll in [employer] but pt can not find info on the program.

## 2016-08-24 NOTE — Telephone Encounter (Signed)
LMTCB

## 2016-08-24 NOTE — Telephone Encounter (Signed)
[facility] Have him also check with his insurance covers sometimes he can get that covered

## 2016-08-25 NOTE — Telephone Encounter (Signed)
Advised pt of Dr Lanice Shirts instructions

## 2016-09-02 ENCOUNTER — Other Ambulatory Visit: Payer: Self-pay | Admitting: Family Medicine

## 2016-09-03 NOTE — Telephone Encounter (Signed)
Is this okay to refill? 

## 2016-09-15 ENCOUNTER — Encounter: Payer: Self-pay | Admitting: *Deleted

## 2016-09-21 ENCOUNTER — Encounter: Payer: Self-pay | Admitting: Internal Medicine

## 2016-09-21 ENCOUNTER — Ambulatory Visit (INDEPENDENT_AMBULATORY_CARE_PROVIDER_SITE_OTHER): Payer: Medicare Other | Admitting: Internal Medicine

## 2016-09-21 ENCOUNTER — Telehealth: Payer: Self-pay | Admitting: Family Medicine

## 2016-09-21 VITALS — BP 134/80 | HR 63 | Ht 76.0 in | Wt 263.0 lb

## 2016-09-21 DIAGNOSIS — I5030 Unspecified diastolic (congestive) heart failure: Secondary | ICD-10-CM

## 2016-09-21 DIAGNOSIS — Z77090 Contact with and (suspected) exposure to asbestos: Secondary | ICD-10-CM | POA: Diagnosis not present

## 2016-09-21 DIAGNOSIS — I209 Angina pectoris, unspecified: Secondary | ICD-10-CM | POA: Diagnosis not present

## 2016-09-21 DIAGNOSIS — J449 Chronic obstructive pulmonary disease, unspecified: Secondary | ICD-10-CM

## 2016-09-21 DIAGNOSIS — I201 Angina pectoris with documented spasm: Secondary | ICD-10-CM | POA: Diagnosis not present

## 2016-09-21 DIAGNOSIS — I10 Essential (primary) hypertension: Secondary | ICD-10-CM | POA: Diagnosis not present

## 2016-09-21 MED ORDER — FUROSEMIDE 40 MG PO TABS: ORAL_TABLET | ORAL | 3 refills | Status: DC

## 2016-09-21 MED ORDER — IPRATROPIUM-ALBUTEROL 20-100 MCG/ACT IN AERS: INHALATION_SPRAY | RESPIRATORY_TRACT | 3 refills | Status: DC

## 2016-09-21 MED ORDER — LISINOPRIL 40 MG PO TABS: 40.0000 mg | ORAL_TABLET | Freq: Every day | ORAL | 3 refills | Status: DC

## 2016-09-21 MED ORDER — NITROGLYCERIN 0.4 MG SL SUBL: 0.4000 mg | SUBLINGUAL_TABLET | SUBLINGUAL | 3 refills | Status: AC | PRN

## 2016-09-21 NOTE — Patient Instructions (Addendum)
Your physician has recommended you make the following change in your medication: INCREASE lasix to 60mg  daily  You have been referred to [name] (pulmonolgist)  Your physician wants you to follow-up in: [place] with Dr. Debara Pickett. You will receive a reminder letter in the mail two months in advance. If you don't receive a letter, please call our office to schedule the follow-up appointment.

## 2016-09-21 NOTE — Telephone Encounter (Signed)
Requesting refill on Combivent Respimat 20-100 mcg inhaler to [facility]. Pt tried to request from [facility] but this med has been removed from his medication list with them so they told him that he need to contact Dr Redmond [employer] to get a new script

## 2016-09-22 DIAGNOSIS — I5032 Chronic diastolic (congestive) heart failure: Secondary | ICD-10-CM | POA: Insufficient documentation

## 2016-09-22 DIAGNOSIS — Z77090 Contact with and (suspected) exposure to asbestos: Secondary | ICD-10-CM | POA: Insufficient documentation

## 2016-09-22 NOTE — Progress Notes (Signed)
OFFICE NOTE  Chief Complaint:  Dyspnea  Primary Care Physician: Wyatt Haste, MD  HPI:  Curtis Riley is a 82 year old gentleman with a history of cervical neuropathy and recent C3-C4 surgery. He also has dyslipidemia, hypertension, sleep apnea, on CPAP, and mild COPD. He underwent catheterization, as you know, in 2012 for chest pain. It was negative except for a small bridging segment in the LAD. When I last saw him he had 2 episodes of chest discomfort and I prescribed some nitroglycerin. He reports that he has not needed to take that in the past 6 months. He has had some increase in mucus and secretions related to his COPD and shortness of breath. He feels that that could be better controlled and is planning to see his primary care provider about that.   This Perch returns today for followup. He is generally doing well. He denies any chest pain. He has had progressive shortness of breath and recently has been approved for oxygen. He plans to wear that 24/7. He does struggle with some lower extremity edema and wears compression stockings.  I saw Curtis Riley back in the office today. He reports that he's had some progressive shortness of breath again, despite using oxygen and CPAP. He's had some worsening leg swelling, more than typical in the past. He wears compression stockings, however has also noted some weight gain and mild orthopnea. I'm concerned that there may be some overlying right heart failure.  I saw Curtis Riley back in the office today. He reports an improvement in his breathing. There was some signs of mild congestive heart failure, possibly right heart failure. Increase his diuretics seem to have helped although he developed a pneumonia and was hospitalized. Subsequently was discharged on antibiotics which he's completed and a nebulizer. He was ordered have an echocardiogram however the LV was not well visualized due to his COPD, therefore no EF was provided. I  recommended a repeat limited echocardiogram with contrast however that was not yet obtained.   Curtis Riley returns today for follow-up. He reports no significant changes in his shortness of breath. He recently was admitted with a pneumonia and is recovered from that. Aced occasionally gets short-lived atypical chest pains. He did have chronic catheterization 2012 which showed no obstructive coronary disease.  12/06/2015  Curtis Riley returns for follow-up. He reports shortness of breath with minimal exertion however relates this to COPD. In fact she's lost about 6 pounds. There is no evidence that he's had any worsening diastolic heart failure. He continues on Lasix 40 mg daily. He's also noted some recent hearing loss in the right ear. Blood pressure is at goal today.  09/24/2016  Curtis Riley was again seen today in follow-up. Overall he continues to have shortness of breath with minimal exertion but he feels this is related to COPD. He does continue to wear oxygen at night. He tells me today that he does have a history of his spasticity exposure in the past and wonders if he could have lung disease related to this or whether it was related to smoking. I suspect this both. He does want to get screened to make sure he doesn't have any lung cancer such as mesothelioma or other findings. He is interested in a pulmonary referral which will place today per his request. He reports recently he's had more swelling than usual. He has been taking 80 mg of Lasix alternating with 40 mg of Lasix. He asked to be easier to take  60 mg daily.  PMHx:  Past Medical History:  Diagnosis Date  . Angina    occasional chest pain  never been treated for this ...   . Arthritis   . BPH (benign prostatic hyperplasia)   . COPD (chronic obstructive pulmonary disease) ([place])   . CVA (cerebral infarction)   . Diverticulosis   . Dyslipidemia   . Family history of coronary artery disease    brothers, sisters  . History of  nuclear stress test 03/2010   dobutamine; mild ischemia in mid inferior and apical inferior regions; ekg negative for ischemia; low risk scan   . Hypertension   . Kidney stones    10-15 yrs ago  . Neuromuscular disorder ([place])   . Shortness of breath    with exertion   . Sleep apnea    uses cpap  . Smoker     Past Surgical History:  Procedure Laterality Date  . ANTERIOR CERVICAL DECOMP/DISCECTOMY FUSION  10/06/2011   Procedure: ANTERIOR CERVICAL DECOMPRESSION/DISCECTOMY FUSION 3 LEVELS;  Surgeon: Faythe Ghee, MD;  Location: [facility];  Service: Neurosurgery;  Laterality: Bilateral;  Cervical three-four, four-five, five-six anterior cervical decompression, fusion  . CARDIAC CATHETERIZATION  10/2010   small bridging segment in LAD  . TONSILLECTOMY  age 30  . TRANSTHORACIC ECHOCARDIOGRAM  2004   normal LV size and function, aortic root dilated  . TRANSURETHRAL RESECTION OF PROSTATE  08/1996  . TRANSURETHRAL RESECTION OF PROSTATE     long time ago per pt.  10-15 yrs    FAMHx:  Family History  Problem Relation Age of Onset  . Arthritis Mother   . Hypertension Mother   . Arthritis Father   . Hypertension Father   . Arthritis Sister   . Diabetes Sister   . Hypertension Sister   . Arthritis Brother   . Hypertension Brother     SOCHx:   reports that he quit smoking about 18 years ago. His smoking use included Cigarettes. He has a 20.00 pack-year smoking history. He has never used smokeless tobacco. He reports that he does not drink alcohol or use drugs.  ALLERGIES:  Allergies  Allergen Reactions  . Penicillins Anaphylaxis    ROS: Pertinent items noted in HPI and remainder of comprehensive ROS otherwise negative.  HOME MEDS: Current Outpatient Prescriptions  Medication Sig Dispense Refill  . aspirin 81 MG tablet Take 81 mg by mouth daily.    . carvedilol (COREG) 25 MG tablet Take 1 tablet (25 mg total) by mouth 2 (two) times daily. 180 tablet 2  . furosemide (LASIX)  40 MG tablet Take 1.5 tablets (60mg ) by mouth daily 135 tablet 3  . Green Tea, Camillia sinensis, (GREEN TEA PO) Take 1 tablet by mouth daily.    Marland Kitchen ibuprofen (ADVIL,MOTRIN) 200 MG tablet Take by mouth every 6 (six) hours as needed for mild pain.     . Ipratropium-Albuterol (COMBIVENT RESPIMAT) 20-100 MCG/ACT AERS respimat USE 1 INHALATION EVERY 6 HOURS 12 g 3  . ipratropium-albuterol (DUONEB) 0.5-2.5 (3) MG/3ML SOLN Take 2-3 times daily as needed    . lisinopril (PRINIVIL,ZESTRIL) 40 MG tablet Take 1 tablet (40 mg total) by mouth daily. 90 tablet 3  . nitroGLYCERIN (NITROSTAT) 0.4 MG SL tablet Place 1 tablet (0.4 mg total) under the tongue every 5 (five) minutes as needed for chest pain. 25 tablet 3  . NON FORMULARY at bedtime. CPAP    . potassium chloride (K-DUR) 10 MEQ tablet Take 1 tablet (10 mEq  total) by mouth daily. 90 tablet 3   No current facility-administered medications for this visit.     LABS/IMAGING: No results found for this or any previous visit (from the past 48 hour(s)). No results found.  VITALS: BP 134/80   Pulse 63   Ht 6\' 4"  (1.93 m)   Wt 263 lb (119.3 kg)   BMI 32.01 kg/m   EXAM: General appearance: alert and no distress Neck: no carotid bruit, no JVD and Cerumen impaction in the right ear canal Lungs: clear to auscultation bilaterally Heart: regular rate and rhythm, S1, S2 normal, no murmur, click, rub or gallop Abdomen: soft, non-tender; bowel sounds normal; no masses,  no organomegaly Extremities: extremities normal, atraumatic, no cyanosis or edema Pulses: 2+ and symmetric Skin: Skin color, texture, turgor normal. No rashes or lesions Neurologic: Grossly normal  EKG: Sinus rhythm at 63  ASSESSMENT: 1. Hypertension-controlled 2. Dyslipidemia 3. COPD - now on oxygen 4. Intermittent coronary spasm 5. History of asbestos exposure  PLAN: 1.   Curtis Riley denies any significant exertional chest pain. He does have a history of intermittent coronary  spasm and gets some sharp chest twinges from time to time. Blood pressure is well-controlled today. He does report progressive dyspnea and is on oxygen at night. Although he only uses Combivent inhalers and nebulizers, there seems to be indication for additional treatment. Apparently he was on Spiriva and was intolerant of that. He also reports a history of spasticity exposure. He would like referral to pulmonary which will place today. I agree with his request to increase daily Lasix to 60 mg which should be more consistent. Follow-up with me in 6 months.  Pixie Casino, MD, [facility] Attending Cardiologist [place] 09/22/2016, 8:31 AM

## 2016-09-24 ENCOUNTER — Encounter: Payer: Self-pay | Admitting: Pulmonary Disease

## 2016-09-24 ENCOUNTER — Ambulatory Visit (INDEPENDENT_AMBULATORY_CARE_PROVIDER_SITE_OTHER)
Admission: RE | Admit: 2016-09-24 | Discharge: 2016-09-24 | Disposition: A | Discharge status: Home / Self Care | Payer: Medicare Other | Source: Ambulatory Visit | Attending: Pulmonary Disease | Admitting: Pulmonary Disease

## 2016-09-24 ENCOUNTER — Ambulatory Visit (INDEPENDENT_AMBULATORY_CARE_PROVIDER_SITE_OTHER): Payer: Medicare Other | Admitting: Pulmonary Disease

## 2016-09-24 VITALS — BP 142/92 | HR 88 | Ht 76.0 in | Wt 263.4 lb

## 2016-09-24 DIAGNOSIS — R0602 Shortness of breath: Secondary | ICD-10-CM

## 2016-09-24 DIAGNOSIS — I201 Angina pectoris with documented spasm: Secondary | ICD-10-CM

## 2016-09-24 DIAGNOSIS — J439 Emphysema, unspecified: Secondary | ICD-10-CM

## 2016-09-24 DIAGNOSIS — R0600 Dyspnea, unspecified: Secondary | ICD-10-CM | POA: Diagnosis not present

## 2016-09-24 DIAGNOSIS — G4733 Obstructive sleep apnea (adult) (pediatric): Secondary | ICD-10-CM

## 2016-09-24 MED ORDER — BUDESONIDE-FORMOTEROL FUMARATE 160-4.5 MCG/ACT IN AERO: 2.0000 | INHALATION_SPRAY | Freq: Two times a day (BID) | RESPIRATORY_TRACT | 0 refills | Status: DC

## 2016-09-24 MED ORDER — BUDESONIDE-FORMOTEROL FUMARATE 160-4.5 MCG/ACT IN AERO: 2.0000 | INHALATION_SPRAY | Freq: Two times a day (BID) | RESPIRATORY_TRACT | 6 refills | Status: DC

## 2016-09-24 NOTE — Progress Notes (Signed)
Subjective:    Patient ID: Curtis Riley, male    DOB: September 06, 1934, 82 y.o.   MRN: [id number]  HPI   This is the case of Curtis Riley, 82 y.o. Male, who made this follow up visit for his copd and chronic hypoxemic respiratory failure.   He was last seen by Dr. Halford Chessman  in June 2015. He has severe COPD based on PFTs. His FEV1 was 1.19 or 38% with significant bronchodilator response. Diffusion capacity was 65%. During that time, he did not feel better under Breo. Patient also has sleep apnea for which CPAP therapy was prescribed together with 2 L oxygen.  Through the years, his dyspnea has slowly gotten worse. Nowadays, he gets winded with doing his usual ADLs. Denies any recent admission or any exacerbation of COPD. He ends up using DuoNeb 2-3 times a day as well as Combivent once a day.   Patient has a 59 PY smoking history, quit in his 35s.  He was born and raised in [place] and was exposed to coal dust.  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  He worked for [employer] from 1987 for 10 years.  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 He was in the service from 1955 until 1975. He was in [place], [place]. 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   He has a cpap for OSA with 2L. He was diagnosed with OSA in 2003. He uses cpap, feels better using it, more energy, less sleepiness.  He is complaint.  No issues with current machine. Feels benefit of cpap. He gets supplies. Unfortunately, no download has been done recently.     Review of Systems  Constitutional: Negative.  Negative for fever and unexpected weight change.  HENT: Positive for dental problem. Negative for congestion, ear pain, nosebleeds, postnasal drip, rhinorrhea, sinus pressure, sneezing, sore throat and trouble swallowing.   Eyes: Negative.  Negative for redness and itching.    Respiratory: Positive for cough and shortness of breath. Negative for chest tightness and wheezing.   Cardiovascular: Negative.  Negative for palpitations and leg swelling.  Gastrointestinal: Negative.  Negative for nausea and vomiting.  Endocrine: Negative.   Genitourinary: Negative.  Negative for dysuria.  Musculoskeletal: Positive for joint swelling.  Skin: Negative.  Negative for rash.  Allergic/Immunologic: Negative.  Negative for environmental allergies, food allergies and immunocompromised state.  Neurological: Negative.  Negative for headaches.  Hematological: Negative.  Does not bruise/bleed easily.  Psychiatric/Behavioral: Negative.  Negative for dysphoric mood. The patient is not nervous/anxious.    Past Medical History:  Diagnosis Date  . Angina    occasional chest pain  never been treated for this ...   . Arthritis   . BPH (benign prostatic hyperplasia)   . COPD (chronic obstructive pulmonary disease) ([place])   . CVA (cerebral infarction)   . Diverticulosis   . Dyslipidemia   . Family history of coronary artery disease    brothers, sisters  . History of nuclear stress test 03/2010   dobutamine; mild ischemia in mid inferior and apical inferior regions; ekg negative for ischemia; low risk scan   . Hypertension   . Kidney stones    10-15 yrs ago  . Neuromuscular disorder ([place])   . Shortness of breath    with exertion   . Sleep apnea  uses cpap  . Smoker    (-) CA, DVT  Family History  Problem Relation Age of Onset  . Arthritis Mother   . Hypertension Mother   . Arthritis Father   . Hypertension Father   . Arthritis Sister   . Diabetes Sister   . Hypertension Sister   . Arthritis Brother   . Hypertension Brother      Past Surgical History:  Procedure Laterality Date  . ANTERIOR CERVICAL DECOMP/DISCECTOMY FUSION  10/06/2011   Procedure: ANTERIOR CERVICAL DECOMPRESSION/DISCECTOMY FUSION 3 LEVELS;  Surgeon: Faythe Ghee, MD;  Location: [facility];   Service: Neurosurgery;  Laterality: Bilateral;  Cervical three-four, four-five, five-six anterior cervical decompression, fusion  . CARDIAC CATHETERIZATION  10/2010   small bridging segment in LAD  . TONSILLECTOMY  age 19  . TRANSTHORACIC ECHOCARDIOGRAM  2004   normal LV size and function, aortic root dilated  . TRANSURETHRAL RESECTION OF PROSTATE  08/1996  . TRANSURETHRAL RESECTION OF PROSTATE     long time ago per pt.  10-15 yrs    Social History   Social History  . Marital status: Divorced    Spouse name: N/A  . Number of children: 2  . Years of education: N/A   Occupational History  . Not on file.   Social History Main Topics  . Smoking status: Former Smoker    Packs/day: 1.00    Years: 20.00    Types: Cigarettes    Quit date: 07/20/1998  . Smokeless tobacco: Never Used  . Alcohol use No  . Drug use: No  . Sexual activity: Not on file   Other Topics Concern  . Not on file   Social History Narrative  . No narrative on file   Lives in [place].   Allergies  Allergen Reactions  . Penicillins Anaphylaxis     Outpatient Medications Prior to Visit  Medication Sig Dispense Refill  . aspirin 81 MG tablet Take 81 mg by mouth daily.    . carvedilol (COREG) 25 MG tablet Take 1 tablet (25 mg total) by mouth 2 (two) times daily. 180 tablet 2  . furosemide (LASIX) 40 MG tablet Take 1.5 tablets (60mg ) by mouth daily 135 tablet 3  . Green Tea, Camillia sinensis, (GREEN TEA PO) Take 1 tablet by mouth daily.    Marland Kitchen ibuprofen (ADVIL,MOTRIN) 200 MG tablet Take by mouth every 6 (six) hours as needed for mild pain.     . Ipratropium-Albuterol (COMBIVENT RESPIMAT) 20-100 MCG/ACT AERS respimat USE 1 INHALATION EVERY 6 HOURS 12 g 3  . ipratropium-albuterol (DUONEB) 0.5-2.5 (3) MG/3ML SOLN Take 2-3 times daily as needed    . lisinopril (PRINIVIL,ZESTRIL) 40 MG tablet Take 1 tablet (40 mg total) by mouth daily. 90 tablet 3  . nitroGLYCERIN (NITROSTAT) 0.4 MG SL tablet Place 1 tablet (0.4 mg  total) under the tongue every 5 (five) minutes as needed for chest pain. 25 tablet 3  . NON FORMULARY at bedtime. CPAP    . potassium chloride (K-DUR) 10 MEQ tablet Take 1 tablet (10 mEq total) by mouth daily. 90 tablet 3   No facility-administered medications prior to visit.    Meds ordered this encounter  Medications  . budesonide-formoterol (SYMBICORT) 160-4.5 MCG/ACT inhaler    Sig: Inhale 2 puffs into the lungs 2 (two) times daily.    Dispense:  1 Inhaler    Refill:  6         Objective:   Physical Exam  Vitals:  Vitals:  09/24/16 1426  BP: (!) 142/92  Pulse: 88  SpO2: 96%  Weight: 263 lb 6.4 oz (119.5 kg)  Height: 6\' 4"  (1.93 m)    Constitutional/General:  Pleasant, well-nourished, well-developed, not in any distress,  Comfortably seating.  Well kempt  Body mass index is 32.06 kg/m. Wt Readings from Last 3 Encounters:  09/24/16 263 lb 6.4 oz (119.5 kg)  09/21/16 263 lb (119.3 kg)  03/31/16 266 lb (120.7 kg)     HEENT: Pupils equal and reactive to light and accommodation. Anicteric sclerae. Normal nasal mucosa.   No oral  lesions,  mouth clear,  oropharynx clear, no postnasal drip. (-) Oral thrush. No dental caries.  Airway - Mallampati class III-IV  Neck: No masses. Midline trachea. No JVD, (-) LAD. (-) bruits appreciated.  Respiratory/Chest: Grossly normal chest. (-) deformity. (-) Accessory muscle use.  Symmetric expansion. (-) Tenderness on palpation.  Resonant on percussion.  Diminished BS on both lower lung zones. (-) wheezing, crackles, rhonchi (-) egophony  Cardiovascular: Regular rate and  rhythm, heart sounds normal, no murmur or gallops, no peripheral edema  Gastrointestinal:  Normal bowel sounds. Soft, non-tender. No hepatosplenomegaly.  (-) masses.   Musculoskeletal:  Normal muscle tone. Normal gait.   Extremities: Grossly normal. (-) clubbing, cyanosis.  (-) edema  Skin: (-) rash,lesions seen.   Neurological/Psychiatric : alert,  oriented to time, place, person. Normal mood and affect          Assessment & Plan:  Chronic obstructive pulmonary disease ([place]) Pt has severe COPD based on PFTs. His FEV1 was 1.19 or 38% with significant bronchodilator response. Diffusion capacity was 65%. During that time, he did not feel better under Breo. Patient also has sleep apnea for which CPAP therapy was prescribed together with 2 L oxygen.  Through the years, his dyspnea has slowly gotten worse. Nowadays, he gets winded with doing his usual ADLs. Denies any recent admission or any exacerbation of COPD. He ends up using DuoNeb 2-3 times a day as well as Combivent once a day.   Patient has a 59 PY smoking history, quit in his 16s.  He was born and raised in [place] and was exposed to coal dust.  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  He worked for [employer] from 1987 for 10 years.  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 He was in the service from 1955 until 1975. He was in [place], [place]. 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   Plan : We extensively discussed the diagnosis, pathophysiology, and medications  for COPD.   Patient will need : Chest XRay PA-L >> Last chest x-ray done in 2015 showed right lower lobe pneumonia. Significant smoking history as well as exposure to asbestos. He is concerned about asbestosis. Most likely he will need a chest CT scan. He wants to discuss chest x-ray and chest CT scan and follow-up sooner rather than later.  Alpha one antitrypsin level >> on f/u.  6MWT >> if o2 sats are less < 88% on f/u.  Nocturnal oximetry >> on f/u.   We will start patient on  -- Symbicort, 160/4.5, 2 puffs twice a day. Consider Trelegy on f/u.  Continue with Duoneb every 4 hrs prn as well as combivent q4-6 hrs pnr.   Patient was instructed to rinse mouth each time he/she  uses : Symbicort.   Influenza vaccine -- 2017 Pneumococcal vaccine -- he got it last 1-2 yrs.  He said he is UTD  Patient was instructed to call the office if he/she has issues with the medications.   Patient was instructed to call the office if he/she is having issues with shortness of breath (i.e. COPD exacerbation).   Patient was instructed to call the office if with increased shortness of breath, cough, sputum production, fevers. Patient may need to be seen at the office for work-up.   Counseled patient on the importance of NOT smoking.     OSA on CPAP Continue CPAP therapy with 2 L oxygen. Feels better using it. More energy. Less sleepiness. He gets supplies. We'll contact [facility] to get a download as well as pt to get supplies. Will need ONO once with good DL.    Return to clinic in 3-4 weeks >> he wants to be seen sooner to make sure he is better.    I personally reviewed previous images (Chest Xray, Chest Ct scan) done on this patient. I reviewed the reports on the images as well.   Monica Becton, MD 09/24/2016, 3:30 PM [facility] Pager [phone] After 3 pm or if no answer, call [phone]

## 2016-09-24 NOTE — Patient Instructions (Signed)
It was a pleasure taking care of you today!  You are diagnosed with Chronic Obstructive Pulmonary Disease or COPD.  COPD is a preventable and treatable disease that makes it difficult to empty air out of the lungs (airflow obstruction).  This can lead to shortness of breath.   Sometimes, when you have a lung infection, this can make your breathing worse, and will cause you to have a COPD flare-up or an acute exacerbation of COPD. Please call your primary care doctor or the office if you are having a COPD flare-up.   Smoking makes COPD worse.   We will get a chest Xray.  Continue using your cpap machine with 2 L o2.   Make sure you use your medications for COPD -- Maintenance medications : Symbicort 160/4.5 2puffs 2x/day  Rescue medications: Combivent  2 puffs every 4-6  hours as needed for shortness of breath. Duoneb every 4 hrs as needed as well for shortness of breath.   Please rinse your mouth each time you use your maintenance medication.  Please call the office if you are having issues with your medications  Return to clinic in 3-4 weeks.

## 2016-09-24 NOTE — Assessment & Plan Note (Signed)
Continue CPAP therapy with 2 L oxygen. Feels better using it. More energy. Less sleepiness. He gets supplies. We'll contact [facility] to get a download as well as pt to get supplies. Will need ONO once with good DL.

## 2016-09-24 NOTE — Addendum Note (Signed)
Addended by: Benson Setting L on: 09/24/2016 04:05 PM   Modules accepted: Orders

## 2016-09-24 NOTE — Assessment & Plan Note (Addendum)
Pt has severe COPD based on PFTs. His FEV1 was 1.19 or 38% with significant bronchodilator response. Diffusion capacity was 65%. During that time, he did not feel better under Breo. Patient also has sleep apnea for which CPAP therapy was prescribed together with 2 L oxygen.  Through the years, his dyspnea has slowly gotten worse. Nowadays, he gets winded with doing his usual ADLs. Denies any recent admission or any exacerbation of COPD. He ends up using DuoNeb 2-3 times a day as well as Combivent once a day.   Patient has a 14 PY smoking history, quit in his 84s.  He was born and raised in [place] and was exposed to coal dust.  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  He worked for [employer] from 1987 for 10 years.  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 He was in the service from 1955 until 1975. He was in [place], [place]. 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   Plan : We extensively discussed the diagnosis, pathophysiology, and medications  for COPD.   Patient will need : Chest XRay PA-L >> Last chest x-ray done in 2015 showed right lower lobe pneumonia. Significant smoking history as well as exposure to asbestos. He is concerned about asbestosis. Most likely he will need a chest CT scan. He wants to discuss chest x-ray and chest CT scan and follow-up sooner rather than later.  Alpha one antitrypsin level >> on f/u.  6MWT >> if o2 sats are less < 88% on f/u.  Nocturnal oximetry >> on f/u.   We will start patient on  -- Symbicort, 160/4.5, 2 puffs twice a day. Consider Trelegy on f/u.  Continue with Duoneb every 4 hrs prn as well as combivent q4-6 hrs pnr.   Patient was instructed to rinse mouth each time he/she uses : Symbicort.   Influenza vaccine -- 2017 Pneumococcal vaccine -- he got it last 1-2 yrs.  He said he is UTD  Patient was  instructed to call the office if he/she has issues with the medications.   Patient was instructed to call the office if he/she is having issues with shortness of breath (i.e. COPD exacerbation).   Patient was instructed to call the office if with increased shortness of breath, cough, sputum production, fevers. Patient may need to be seen at the office for work-up.   Counseled patient on the importance of NOT smoking.

## 2016-09-29 ENCOUNTER — Encounter: Payer: Self-pay | Admitting: Pulmonary Disease

## 2016-10-01 ENCOUNTER — Telehealth: Payer: Self-pay | Admitting: Pulmonary Disease

## 2016-10-01 NOTE — Telephone Encounter (Signed)
Received a PA request for Symbicort 160. PA stated patient needed to try and fail Advair Diskus or HFA. Pt failed diskus 250/50 on 11/20/13.   Determined PA was not needed based on this information. [facility] on [address] in [place] is aware.

## 2016-10-03 ENCOUNTER — Other Ambulatory Visit: Payer: Self-pay | Admitting: Family Medicine

## 2016-10-05 NOTE — Telephone Encounter (Signed)
Ok to refill 

## 2016-10-06 ENCOUNTER — Telehealth: Payer: Self-pay | Admitting: Pulmonary Disease

## 2016-10-06 NOTE — Telephone Encounter (Signed)
   When I saw the patient recently, he mentioned he was on "CPAP" for OSA.  Got a download of his machine. DME is [facility]. He actually has an NIV or Trilogy. Download the last month: 87%, tidal volume set at 600 mls, average IPAP 20cm water, average CPAP 5 cm water.  Jasmine : pls tell pt we got a DL of his machine and it is working well. Thanks.    Monica Becton, MD 10/06/2016, 1:26 PM [facility] Pager [phone] After 3 pm or if no answer, call [phone]

## 2016-10-07 NOTE — Telephone Encounter (Signed)
lmomtcb x1 

## 2016-10-08 NOTE — Telephone Encounter (Signed)
lmomtcb x 2  

## 2016-10-17 ENCOUNTER — Encounter: Payer: Self-pay | Admitting: Pulmonary Disease

## 2016-10-19 ENCOUNTER — Telehealth: Payer: Self-pay | Admitting: Pulmonary Disease

## 2016-10-19 NOTE — Telephone Encounter (Signed)
A fax was received from [employer] for a PA needed for Symbicort 160. A PA was initiated an approved using covermymeds from 09/19/2016 through 07/19/2098. [facility] [id number]) was called and informed of the approval. Pt was called, but did not answer. Nothing further is needed.   Covermymeds key: [id number]

## 2016-10-19 NOTE — Telephone Encounter (Signed)
lmom tcb x3 will send a letter if we have not heard from him by the end of day

## 2016-10-20 ENCOUNTER — Other Ambulatory Visit: Payer: Medicare Other

## 2016-10-20 ENCOUNTER — Encounter: Payer: Self-pay | Admitting: Pulmonary Disease

## 2016-10-20 ENCOUNTER — Ambulatory Visit (INDEPENDENT_AMBULATORY_CARE_PROVIDER_SITE_OTHER): Payer: Medicare Other | Admitting: Pulmonary Disease

## 2016-10-20 VITALS — BP 136/68 | HR 64 | Ht 76.0 in | Wt 261.0 lb

## 2016-10-20 DIAGNOSIS — J439 Emphysema, unspecified: Secondary | ICD-10-CM

## 2016-10-20 DIAGNOSIS — R0602 Shortness of breath: Secondary | ICD-10-CM

## 2016-10-20 DIAGNOSIS — I201 Angina pectoris with documented spasm: Secondary | ICD-10-CM

## 2016-10-20 DIAGNOSIS — R0609 Other forms of dyspnea: Secondary | ICD-10-CM | POA: Diagnosis not present

## 2016-10-20 DIAGNOSIS — G4733 Obstructive sleep apnea (adult) (pediatric): Secondary | ICD-10-CM

## 2016-10-20 MED ORDER — BUDESONIDE-FORMOTEROL FUMARATE 160-4.5 MCG/ACT IN AERO: 2.0000 | INHALATION_SPRAY | Freq: Two times a day (BID) | RESPIRATORY_TRACT | 6 refills | Status: DC

## 2016-10-20 NOTE — Assessment & Plan Note (Signed)
Patient has chronic exertional dyspnea. He sees Dr. Wynonia Lawman for diastolic heart failure. He is chronic edema. He had a cath in 2012. He is on Lasix.  He feels his dyspnea is related to COPD as well as his "lung damage" from chemical exposure related to work and cigarette smoking.  Plan to treat COPD. Need to get PFT as well as chest CT scan.  I think at the end of today, his dyspnea is related to severe COPD as well as diastolic heart failure.

## 2016-10-20 NOTE — Assessment & Plan Note (Addendum)
Continue NIV therapy with 2 L oxygen. Feels better using it. More energy. Less sleepiness. He gets supplies. Will need ONO on 2L and NIV

## 2016-10-20 NOTE — Progress Notes (Signed)
Subjective:    Patient ID: Curtis Riley, male    DOB: 20-Apr-1935, 82 y.o.   MRN: [id number]  HPI   This is the case of Curtis Riley, 82 y.o. Male, who made this follow up visit for his copd and chronic hypoxemic respiratory failure.   He was last seen by Dr. Halford Chessman  in June 2015. He has severe COPD based on PFTs. His FEV1 was 1.19 or 38% with significant bronchodilator response. Diffusion capacity was 65%. During that time, he did not feel better under Breo. Patient also has sleep apnea for which CPAP therapy was prescribed together with 2 L oxygen.  Through the years, his dyspnea has slowly gotten worse. Nowadays, he gets winded with doing his usual ADLs. Denies any recent admission or any exacerbation of COPD. He ends up using DuoNeb 2-3 times a day as well as Combivent once a day.   Patient has a 75 PY smoking history, quit in his 67s.  He was born and raised in [place] and was exposed to coal dust.  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  He worked for [employer] from 1987 for 10 years.  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 He was in the service from 1955 until 1975. He was in [place], [place], [place]. 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   He has a cpap for OSA with 2L. He was diagnosed with OSA in 2003. He uses cpap, feels better using it, more energy, less sleepiness.  He is complaint.  No issues with current machine. Feels benefit of cpap. He gets supplies. Unfortunately, no download has been done recently.  ROV 10/20/2016 Patient returns to the office as follow-up on his COPD and dyspnea. He started using Symbicort, some better but overall he still remains short of breath. He is fixated on getting a chest CT scan to determine if he has "lung damage" from all the exposures he had with work. Chest x-ray only showed COPD changes. He has  congestion in the morning when he wakes up and eventually gets better throughout the day. He continues to use his NIV with 2 L o2 (not cpap as I have written before). He takes Lasix every day for his chronic diastolic heart failure.   Review of Systems  Constitutional: Negative.  Negative for fever and unexpected weight change.  HENT: Positive for dental problem. Negative for congestion, ear pain, nosebleeds, postnasal drip, rhinorrhea, sinus pressure, sneezing, sore throat and trouble swallowing.   Eyes: Negative.  Negative for redness and itching.  Respiratory: Positive for cough and shortness of breath. Negative for chest tightness and wheezing.   Cardiovascular: Negative.  Negative for palpitations and leg swelling.  Gastrointestinal: Negative.  Negative for nausea and vomiting.  Endocrine: Negative.   Genitourinary: Negative.  Negative for dysuria.  Musculoskeletal: Positive for joint swelling.  Skin: Negative.  Negative for rash.  Allergic/Immunologic: Negative.  Negative for environmental allergies, food allergies and immunocompromised state.  Neurological: Negative.  Negative for headaches.  Hematological: Negative.  Does not bruise/bleed easily.  Psychiatric/Behavioral: Negative.  Negative for dysphoric mood. The patient is not nervous/anxious.       Objective:   Physical Exam  Vitals:  Vitals:   10/20/16 1509  BP: 136/68  Pulse: 64  SpO2: 98%  Weight: 261 lb (118.4 kg)  Height:  6\' 4"  (1.93 m)    Constitutional/General:  Pleasant, well-nourished, well-developed, not in any distress,  Comfortably seating.  Well kempt  Body mass index is 31.77 kg/m. Wt Readings from Last 3 Encounters:  10/20/16 261 lb (118.4 kg)  09/24/16 263 lb 6.4 oz (119.5 kg)  09/21/16 263 lb (119.3 kg)     HEENT: Pupils equal and reactive to light and accommodation. Anicteric sclerae. Normal nasal mucosa.   No oral  lesions,  mouth clear,  oropharynx clear, no postnasal drip. (-) Oral  thrush. No dental caries.  Airway - Mallampati class III-IV  Neck: No masses. Midline trachea. No JVD, (-) LAD. (-) bruits appreciated.  Respiratory/Chest: Grossly normal chest. (-) deformity. (-) Accessory muscle use.  Symmetric expansion. (-) Tenderness on palpation.  Resonant on percussion.  Diminished BS on both lower lung zones. (-) wheezing, crackles, rhonchi (-) egophony  Cardiovascular: Regular rate and  rhythm, heart sounds normal, no murmur or gallops, Gr 1-2  peripheral edema  Gastrointestinal:  Normal bowel sounds. Soft, non-tender. No hepatosplenomegaly.  (-) masses.   Musculoskeletal:  Normal muscle tone. Normal gait.   Extremities: Grossly normal. (-) clubbing, cyanosis.  Gr 1-2 edema  Skin: (-) rash,lesions seen.   Neurological/Psychiatric : alert, oriented to time, place, person. Normal mood and affect          Assessment & Plan:  Chronic obstructive pulmonary disease ([place]) Pt has severe COPD based on PFTs in 2015. His FEV1 was 1.19 or 38% with significant bronchodilator response. Diffusion capacity was 65%. During that time, he did not feel better under Breo. Patient also has sleep apnea for which CPAP therapy was prescribed together with 2 L oxygen.  Through the years, his dyspnea has slowly gotten worse. Nowadays, he gets winded with doing his usual ADLs. Denies any recent admission or any exacerbation of COPD. He ends up using DuoNeb 2-3 times a day as well as Combivent once a day.   Patient has a 30 PY smoking history, quit in his 4s.  He was born and raised in [place] and was exposed to coal dust.  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  He worked for [employer] from 1987 for 10 years.  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 He was in the service from 1955 until 1975. He was in [place], [place]. 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   CXR (09/2016)  >> COPD changes.   Plan : We extensively discussed the diagnosis, pathophysiology, and medications  for COPD.   Patient will need : Chest ct scan  >> to help with w/u for SOB. Will discuss in a separate problem. Alpha one antitrypsin level ordered today.  6MWT >> if o2 sats are less < 88%  Nocturnal oximetry on 2l and NIV.  PFT >> compare to 2015 PFT and see if COPD severity is the same.   Cont Symbicort, 160/4.5, 2 puffs twice a day. Consider Trelegy on f/u. Wanted to try trelegy but he wanted to hold off and see how symbicort helps him.  We'll consider daliresp on f/u as well.  Continue with Duoneb every 4 hrs prn as well as combivent q4-6 hrs prn.  Cont NIV with 2L O2. Download in February showed good compliance. 87%. Tidal volume set at 600, IPAP usually at 20 cm water, CPAP usually  at 5 cm water.  Patient was instructed to rinse mouth each time he/she uses : Symbicort.   Influenza vaccine -- 2017 Pneumococcal vaccine -- he got it last 1-2 yrs.  He said he is UTD  Patient was instructed to call the office if he/she has issues with the medications.   Patient was instructed to call the office if he/she is having issues with shortness of breath (i.e. COPD exacerbation).   Patient was instructed to call the office if with increased shortness of breath, cough, sputum production, fevers. Patient may need to be seen at the office for work-up.   Counseled patient on the importance of NOT smoking.     OSA (obstructive sleep apnea) Continue NIV therapy with 2 L oxygen. Feels better using it. More energy. Less sleepiness. He gets supplies. Will need ONO on 2L and NIV  Exertional dyspnea Patient has chronic exertional dyspnea. He sees Dr. Wynonia Lawman for diastolic heart failure. He is chronic edema. He had a cath in 2012. He is on Lasix.  He feels his dyspnea is related to COPD as well as his "lung damage" from chemical exposure related to work  and cigarette smoking.  Plan to treat COPD. Need to get PFT as well as chest CT scan.  I think at the end of today, his dyspnea is related to severe COPD as well as diastolic heart failure.   Return to clinic in 4-6 weeks   J. Shirl Harris, MD 10/20/2016, 4:08 PM [facility] Pager [phone] After 3 pm or if no answer, call [phone]

## 2016-10-20 NOTE — Patient Instructions (Signed)
It was a pleasure taking care of you today!  You are diagnosed with Chronic Obstructive Pulmonary Disease or COPD.  COPD is a preventable and treatable disease that makes it difficult to empty air out of the lungs (airflow obstruction).  This can lead to shortness of breath.   Sometimes, when you have a lung infection, this can make your breathing worse, and will cause you to have a COPD flare-up or an acute exacerbation of COPD. Please call your primary care doctor or the office if you are having a COPD flare-up.   Smoking makes COPD worse.   Make sure you use your medications for COPD -- Maintenance medications : Symbicort 160/4.5 2Puffs. 2x/day.   Rescue medications: Albuterol 2 puffs every 4 hours as needed for shortness of breath.   Please rinse your mouth each time you use your maintenance medication.  Please call the office if you are having issues with your medications  We will get a breathing test as well as a chest CT scan.  Return to clinic in 4-6 weeks with [name] or NP

## 2016-10-20 NOTE — Assessment & Plan Note (Addendum)
Pt has severe COPD based on PFTs in 2015. His FEV1 was 1.19 or 38% with significant bronchodilator response. Diffusion capacity was 65%. During that time, he did not feel better under Breo. Patient also has sleep apnea for which CPAP therapy was prescribed together with 2 L oxygen.  Through the years, his dyspnea has slowly gotten worse. Nowadays, he gets winded with doing his usual ADLs. Denies any recent admission or any exacerbation of COPD. He ends up using DuoNeb 2-3 times a day as well as Combivent once a day.   Patient has a 49 PY smoking history, quit in his 74s.  He was born and raised in [place] and was exposed to coal dust.  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  He worked for [employer] from 1987 for 10 years.  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 He was in the service from 1955 until 1975. He was in [place], [place]. 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   CXR (09/2016)  >> COPD changes.   Plan : We extensively discussed the diagnosis, pathophysiology, and medications  for COPD.   Patient will need : Chest ct scan  >> to help with w/u for SOB. Will discuss in a separate problem. Alpha one antitrypsin level ordered today.  6MWT >> if o2 sats are less < 88%  Nocturnal oximetry on 2l and NIV.  PFT >> compare to 2015 PFT and see if COPD severity is the same.   Cont Symbicort, 160/4.5, 2 puffs twice a day. Consider Trelegy on f/u. Wanted to try trelegy but he wanted to hold off and see how symbicort helps him.  We'll consider daliresp on f/u as well.  Continue with Duoneb every 4 hrs prn as well as combivent q4-6 hrs prn.  Cont NIV with 2L O2. Download in February showed good compliance. 87%. Tidal volume set at 600, IPAP usually at 20 cm water, CPAP usually at 5 cm water.  Patient was instructed to rinse mouth each time he/she uses :  Symbicort.   Influenza vaccine -- 2017 Pneumococcal vaccine -- he got it last 1-2 yrs.  He said he is UTD  Patient was instructed to call the office if he/she has issues with the medications.   Patient was instructed to call the office if he/she is having issues with shortness of breath (i.e. COPD exacerbation).   Patient was instructed to call the office if with increased shortness of breath, cough, sputum production, fevers. Patient may need to be seen at the office for work-up.   Counseled patient on the importance of NOT smoking.

## 2016-10-20 NOTE — Telephone Encounter (Signed)
Unable to reach letter sent. Will close message as three attempts has been made

## 2016-10-21 ENCOUNTER — Ambulatory Visit (INDEPENDENT_AMBULATORY_CARE_PROVIDER_SITE_OTHER)
Admission: RE | Admit: 2016-10-21 | Discharge: 2016-10-21 | Disposition: A | Discharge status: Home / Self Care | Payer: Medicare Other | Source: Ambulatory Visit | Attending: Pulmonary Disease | Admitting: Pulmonary Disease

## 2016-10-21 DIAGNOSIS — R0602 Shortness of breath: Secondary | ICD-10-CM

## 2016-10-21 DIAGNOSIS — J439 Emphysema, unspecified: Secondary | ICD-10-CM

## 2016-10-21 DIAGNOSIS — R918 Other nonspecific abnormal finding of lung field: Secondary | ICD-10-CM | POA: Diagnosis not present

## 2016-10-21 DIAGNOSIS — J984 Other disorders of lung: Secondary | ICD-10-CM | POA: Diagnosis not present

## 2016-10-23 LAB — ALPHA-1 ANTITRYPSIN PHENOTYPE: A1 ANTITRYPSIN: 130 mg/dL (ref 83–199)

## 2016-10-28 ENCOUNTER — Telehealth: Payer: Self-pay | Admitting: Pulmonary Disease

## 2016-10-28 MED ORDER — BUDESONIDE-FORMOTEROL FUMARATE 160-4.5 MCG/ACT IN AERO: 2.0000 | INHALATION_SPRAY | Freq: Two times a day (BID) | RESPIRATORY_TRACT | 3 refills | Status: DC

## 2016-10-28 NOTE — Telephone Encounter (Signed)
Pt requesting 90 day supply of symbicort to [facility].  This has been sent.  Nothing further needed.

## 2016-10-29 ENCOUNTER — Telehealth: Payer: Self-pay | Admitting: Pulmonary Disease

## 2016-10-29 DIAGNOSIS — J439 Emphysema, unspecified: Secondary | ICD-10-CM

## 2016-10-29 NOTE — Telephone Encounter (Signed)
Pt aware of results and voiced his understanding. CT has been ordered for 1y. Nothing further needed.    Notes recorded by Rush Landmark, MD on 10/22/2016 at 9:01 AM EDT pls tell pt the ct scan showed COPD changes and several "very tiny spots" in both lungs. Nothing to worry about spots for now. Could be related to previous infection. Spots are too small to be biopsied. Best thing to do is rpt chest ct scan, no contrast, in a year.

## 2016-11-05 ENCOUNTER — Telehealth: Payer: Self-pay | Admitting: Pulmonary Disease

## 2016-11-05 NOTE — Telephone Encounter (Signed)
   ONO on NIV and 2L O2 >> no significant desatn.   Monica Becton, MD 11/05/2016, 1:44 PM [facility] Pager [phone] After 3 pm or if no answer, call [phone]

## 2016-11-25 ENCOUNTER — Ambulatory Visit (INDEPENDENT_AMBULATORY_CARE_PROVIDER_SITE_OTHER): Payer: Medicare Other | Admitting: Family Medicine

## 2016-11-25 ENCOUNTER — Encounter: Payer: Self-pay | Admitting: Family Medicine

## 2016-11-25 VITALS — BP 128/84 | HR 80 | Ht 76.0 in | Wt 269.0 lb

## 2016-11-25 DIAGNOSIS — G4733 Obstructive sleep apnea (adult) (pediatric): Secondary | ICD-10-CM | POA: Diagnosis not present

## 2016-11-25 DIAGNOSIS — J439 Emphysema, unspecified: Secondary | ICD-10-CM

## 2016-11-25 DIAGNOSIS — J9611 Chronic respiratory failure with hypoxia: Secondary | ICD-10-CM

## 2016-11-25 DIAGNOSIS — Z23 Encounter for immunization: Secondary | ICD-10-CM | POA: Diagnosis not present

## 2016-11-25 DIAGNOSIS — G8929 Other chronic pain: Secondary | ICD-10-CM

## 2016-11-25 DIAGNOSIS — I10 Essential (primary) hypertension: Secondary | ICD-10-CM

## 2016-11-25 DIAGNOSIS — Z9989 Dependence on other enabling machines and devices: Secondary | ICD-10-CM | POA: Diagnosis not present

## 2016-11-25 DIAGNOSIS — M545 Low back pain, unspecified: Secondary | ICD-10-CM

## 2016-11-25 DIAGNOSIS — I5032 Chronic diastolic (congestive) heart failure: Secondary | ICD-10-CM | POA: Diagnosis not present

## 2016-11-25 LAB — CBC WITH DIFFERENTIAL/PLATELET
BASOS ABS: 0 {cells}/uL (ref 0–200)
Basophils Relative: 0 %
EOS ABS: 57 {cells}/uL (ref 15–500)
Eosinophils Relative: 1 %
HCT: 39.2 % (ref 38.5–50.0)
Hemoglobin: 12.6 g/dL — ABNORMAL LOW (ref 13.2–17.1)
LYMPHS PCT: 26 %
Lymphs Abs: 1482 cells/uL (ref 850–3900)
MCH: 31.1 pg (ref 27.0–33.0)
MCHC: 32.1 g/dL (ref 32.0–36.0)
MCV: 96.8 fL (ref 80.0–100.0)
MONOS PCT: 7 %
MPV: 9 fL (ref 7.5–12.5)
Monocytes Absolute: 399 cells/uL (ref 200–950)
Neutro Abs: 3762 cells/uL (ref 1500–7800)
Neutrophils Relative %: 66 %
PLATELETS: 210 10*3/uL (ref 140–400)
RBC: 4.05 MIL/uL — ABNORMAL LOW (ref 4.20–5.80)
RDW: 13.1 % (ref 11.0–15.0)
WBC: 5.7 10*3/uL (ref 4.0–10.5)

## 2016-11-25 LAB — COMPREHENSIVE METABOLIC PANEL
ALT: 16 U/L (ref 9–46)
AST: 21 U/L (ref 10–35)
Albumin: 3.5 g/dL — ABNORMAL LOW (ref 3.6–5.1)
Alkaline Phosphatase: 69 U/L (ref 40–115)
BUN: 14 mg/dL (ref 7–25)
CHLORIDE: 107 mmol/L (ref 98–110)
CO2: 25 mmol/L (ref 20–31)
Calcium: 9.2 mg/dL (ref 8.6–10.3)
Creat: 1.39 mg/dL — ABNORMAL HIGH (ref 0.70–1.18)
GLUCOSE: 88 mg/dL (ref 65–99)
POTASSIUM: 4.4 mmol/L (ref 3.5–5.3)
Sodium: 140 mmol/L (ref 135–146)
TOTAL PROTEIN: 6.5 g/dL (ref 6.1–8.1)
Total Bilirubin: 0.7 mg/dL (ref 0.2–1.2)

## 2016-11-25 NOTE — Progress Notes (Signed)
   Subjective:    Patient ID: Curtis Riley, male    DOB: Feb 28, 1935, 82 y.o.   MRN: [id number]  HPI He is here for an interval evaluation. He is being followed by cardiology as well as pulmonary. Presently he is on multiple inhalers and has had some difficulty with slight sore throat especially in the morning in spite of his irrigating his throat after he uses the inhaler. He does not complain of dyspnea on exertion or shortness of breath. Also sees cardiology and again has had no chest pain, dyspnea on exertion, PND. Continues on his CPAP machine and seems be doing well with this. His back does continue to give him difficulty but he seems be handling this fairly well. He continues on lisinopril to treat his hypertension. Family and social history as well as health maintenance and immunizations were reviewed.  Review of Systems     Objective:   Physical Exam Alert and in no distress. Tympanic membranes and canals are normal. Pharyngeal area is normal. Neck is supple without adenopathy or thyromegaly. Cardiac exam shows a regular sinus rhythm without murmurs or gallops. Lungs are clear to auscultation.       Assessment & Plan:  Essential hypertension - Plan: Pneumococcal conjugate vaccine 13-valent IM, CBC with Differential/Platelet, Comprehensive metabolic panel  Chronic respiratory failure with hypoxia ([facility]) - Plan: Pneumococcal conjugate vaccine 13-valent IM, CBC with Differential/Platelet, Comprehensive metabolic panel  Pulmonary emphysema, unspecified emphysema type ([place]) - Plan: Comprehensive metabolic panel  OSA on CPAP  Chronic diastolic congestive heart failure ([place]) - Plan: Comprehensive metabolic panel  Chronic low back pain without sciatica, unspecified back pain laterality  Need for vaccination with 13-polyvalent pneumococcal conjugate vaccine - Plan: Pneumococcal conjugate vaccine 13-valent IM  Chronic diastolic heart failure ([place]) His cardiac and pulmonary status  seemed to be adequately controlled on the present medicines. He will continue on his CPAP. He does have appointment scheduled with cardiology and pulmonary in the future. His immunizations were updated. Also recommend he get Shingrix and TDaP at the drugstore.

## 2016-11-26 ENCOUNTER — Other Ambulatory Visit: Payer: Self-pay

## 2016-11-26 DIAGNOSIS — R944 Abnormal results of kidney function studies: Secondary | ICD-10-CM

## 2016-12-11 ENCOUNTER — Ambulatory Visit: Payer: Medicare Other | Admitting: Pulmonary Disease

## 2016-12-17 ENCOUNTER — Ambulatory Visit (INDEPENDENT_AMBULATORY_CARE_PROVIDER_SITE_OTHER): Payer: Medicare Other | Admitting: Pulmonary Disease

## 2016-12-17 ENCOUNTER — Encounter: Payer: Self-pay | Admitting: Pulmonary Disease

## 2016-12-17 DIAGNOSIS — Z9989 Dependence on other enabling machines and devices: Secondary | ICD-10-CM

## 2016-12-17 DIAGNOSIS — J439 Emphysema, unspecified: Secondary | ICD-10-CM | POA: Diagnosis not present

## 2016-12-17 DIAGNOSIS — I201 Angina pectoris with documented spasm: Secondary | ICD-10-CM

## 2016-12-17 DIAGNOSIS — R918 Other nonspecific abnormal finding of lung field: Secondary | ICD-10-CM | POA: Insufficient documentation

## 2016-12-17 DIAGNOSIS — G4733 Obstructive sleep apnea (adult) (pediatric): Secondary | ICD-10-CM | POA: Diagnosis not present

## 2016-12-17 DIAGNOSIS — R0602 Shortness of breath: Secondary | ICD-10-CM

## 2016-12-17 LAB — PULMONARY FUNCTION TEST
DL/VA % PRED: 99 %
DL/VA: 4.66 ml/min/mmHg/L
DLCO COR % PRED: 76 %
DLCO cor: 26.75 ml/min/mmHg
DLCO unc % pred: 74 %
DLCO unc: 26.05 ml/min/mmHg
FEF 25-75 Post: 0.66 L/sec
FEF 25-75 Pre: 0.59 L/sec
FEF2575-%CHANGE-POST: 11 %
FEF2575-%Pred-Post: 29 %
FEF2575-%Pred-Pre: 26 %
FEV1-%CHANGE-POST: 3 %
FEV1-%PRED-PRE: 45 %
FEV1-%Pred-Post: 46 %
FEV1-POST: 1.36 L
FEV1-Pre: 1.31 L
FEV1FVC-%Change-Post: 0 %
FEV1FVC-%Pred-Pre: 68 %
FEV6-%Change-Post: 3 %
FEV6-%PRED-PRE: 67 %
FEV6-%Pred-Post: 69 %
FEV6-POST: 2.6 L
FEV6-PRE: 2.52 L
FEV6FVC-%Change-Post: 0 %
FEV6FVC-%PRED-POST: 103 %
FEV6FVC-%Pred-Pre: 103 %
FVC-%CHANGE-POST: 3 %
FVC-%PRED-POST: 66 %
FVC-%PRED-PRE: 64 %
FVC-POST: 2.64 L
FVC-PRE: 2.55 L
POST FEV6/FVC RATIO: 98 %
PRE FEV6/FVC RATIO: 99 %
Post FEV1/FVC ratio: 51 %
Pre FEV1/FVC ratio: 52 %
RV % pred: 200 %
RV: 5.54 L
TLC % PRED: 116 %
TLC: 8.67 L

## 2016-12-17 NOTE — Progress Notes (Signed)
PFT done today. 

## 2016-12-17 NOTE — Patient Instructions (Signed)
It was a pleasure taking care of you today!  You are diagnosed with Chronic Obstructive Pulmonary Disease or COPD.  COPD is a preventable and treatable disease that makes it difficult to empty air out of the lungs (airflow obstruction).  This can lead to shortness of breath.   Sometimes, when you have a lung infection, this can make your breathing worse, and will cause you to have a COPD flare-up or an acute exacerbation of COPD. Please call your primary care doctor or the office if you are having a COPD flare-up.   Smoking makes COPD worse.   Make sure you use your medications for COPD -- Maintenance medications : Symbicort, 160/4.5, 2 puffs daily.  Rescue medications: Albuterol 2 puffs every 4 hours as needed for shortness of breath.   Please rinse your mouth each time you use your maintenance medication.  Please call the office if you are having issues with your medications  Return to clinic in as needed.   (patient will make an appointment to be seen after chest ct scan in 10/2016).

## 2016-12-17 NOTE — Progress Notes (Signed)
Subjective:    Patient ID: Curtis Riley, male    DOB: [birth year], 82 y.o.   MRN: [id number]  HPI   This is the case of Curtis Riley, 82 y.o. Male, who made this follow up visit for his copd and chronic hypoxemic respiratory failure.   He was last seen by Dr. Halford Chessman  in June 2015. He has severe COPD based on PFTs. His FEV1 was 1.19 or 38% with significant bronchodilator response. Diffusion capacity was 65%. During that time, he did not feel better under Breo. Patient also has sleep apnea for which CPAP therapy was prescribed together with 2 L oxygen.  Through the years, his dyspnea has slowly gotten worse. Nowadays, he gets winded with doing his usual ADLs. Denies any recent admission or any exacerbation of COPD. He ends up using DuoNeb 2-3 times a day as well as Combivent once a day.   Patient has a 18 PY smoking history, quit in his 1s.  He was born and raised in [place] and was exposed to coal dust.  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  He worked for [employer] from 1987 for 10 years.  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 He was in the service from 1955 until 1975. He was in [place], [place], [place]. 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   He has a cpap for OSA with 2L. He was diagnosed with OSA in 2003. He uses cpap, feels better using it, more energy, less sleepiness.  He is complaint.  No issues with current machine. Feels benefit of cpap. He gets supplies. Unfortunately, no download has been done recently.  ROV 10/20/2016 Patient returns to the office as follow-up on his COPD and dyspnea. He started using Symbicort, some better but overall he still remains short of breath. He is fixated on getting a chest CT scan to determine if he has "lung damage" from all the exposures he had with work. Chest x-ray only showed COPD changes. He has  congestion in the morning when he wakes up and eventually gets better throughout the day. He continues to use his NIV with 2 L o2 (not cpap as I have written before). He takes Lasix every day for his chronic diastolic heart failure.   ROV 12/17/2016 patient returns to the office as follow-up on his COPD and dyspnea. Since last seen, Symbicort and has made him cough more and sore throat.  He can not tolerate BID symbicort. On 1x/d of symbiocrt, he is happy. Uses NIV with 2 L at HS. NO issues.    Review of Systems  Constitutional: Negative.  Negative for fever and unexpected weight change.  HENT: Positive for dental problem. Negative for congestion, ear pain, nosebleeds, postnasal drip, rhinorrhea, sinus pressure, sneezing, sore throat and trouble swallowing.   Eyes: Negative.  Negative for redness and itching.  Respiratory: Positive for cough and shortness of breath. Negative for chest tightness and wheezing.   Cardiovascular: Negative.  Negative for palpitations and leg swelling.  Gastrointestinal: Negative.  Negative for nausea and vomiting.  Endocrine: Negative.   Genitourinary: Negative.  Negative for dysuria.  Musculoskeletal: Positive for joint swelling.  Skin: Negative.  Negative for rash.  Allergic/Immunologic: Negative.  Negative for environmental allergies, food allergies and immunocompromised state.  Neurological: Negative.  Negative for headaches.  Hematological: Negative.  Does not bruise/bleed easily.  Psychiatric/Behavioral: Negative.  Negative for dysphoric mood. The patient is not nervous/anxious.       Objective:   Physical Exam  Vitals:  Vitals:   12/17/16 1506  BP: 132/88  Pulse: 85  SpO2: 96%  Weight: 261 lb 6.4 oz (118.6 kg)  Height: 6\' 4"  (1.93 m)    Constitutional/General:  Pleasant, well-nourished, well-developed, not in any distress,  Comfortably seating.  Well kempt  Body mass index is 31.82 kg/m. Wt Readings from Last 3 Encounters:  12/17/16 261 lb  6.4 oz (118.6 kg)  11/25/16 269 lb (122 kg)  10/20/16 261 lb (118.4 kg)     HEENT: Pupils equal and reactive to light and accommodation. Anicteric sclerae. Normal nasal mucosa.   No oral  lesions,  mouth clear,  oropharynx clear, no postnasal drip. (-) Oral thrush. No dental caries.  Airway - Mallampati class III-IV  Neck: No masses. Midline trachea. No JVD, (-) LAD. (-) bruits appreciated.  Respiratory/Chest: Grossly normal chest. (-) deformity. (-) Accessory muscle use.  Symmetric expansion. (-) Tenderness on palpation.  Resonant on percussion.  Diminished BS on both lower lung zones. (-) wheezing, crackles, rhonchi (-) egophony  Cardiovascular: Regular rate and  rhythm, heart sounds normal, no murmur or gallops, Gr 1-2  peripheral edema  Gastrointestinal:  Normal bowel sounds. Soft, non-tender. No hepatosplenomegaly.  (-) masses.   Musculoskeletal:  Normal muscle tone. Normal gait.   Extremities: Grossly normal. (-) clubbing, cyanosis.  Gr 1-2 edema  Skin: (-) rash,lesions seen.   Neurological/Psychiatric : alert, oriented to time, place, person. Normal mood and affect          Assessment & Plan:  Chronic obstructive pulmonary disease ([place]) Pt has severe COPD based on PFTs in 2015. His FEV1 was 1.19 or 38% with significant bronchodilator response. Diffusion capacity was 65%. During that time, he did not feel better under Breo. Patient also has sleep apnea for which CPAP therapy was prescribed together with 2 L oxygen.  Through the years, his dyspnea has slowly gotten worse. Nowadays, he gets winded with doing his usual ADLs. Denies any recent admission or any exacerbation of COPD. He ends up using DuoNeb 2-3 times a day as well as Combivent once a day.   Patient has a 40 PY smoking history, quit in his 88s.  He was born and raised in [place] and was exposed to coal dust.  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  He worked for [employer] from 1987 for 10 years.  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 He was in the service from 1955 until 1975. He was in [place], [place]. 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   Pt's SOB is better. He did not tolerate the twice a day dose of Symbicort. Started having sore throat and cough. He is better on once a day Symbicort.  PFT done 12/17/16 >> FEV1  1.31  45% CXR (09/2016)  >> COPD changes.  Chest CT scan (April 2018) >> several subcm nodules related to smoking Alpha one level was normal.  ONO on 2L NIV >> no significant desatn   Plan : We extensively discussed the diagnosis, pathophysiology, and medications  for COPD.    Cont Symbicort, 160/4.5, 2 puffs  a day. Consider Trelegy on f/u If he gets more  symptomatic. He is happy with the way his dyspnea is. He is satisfied with his COPD treatment.  Continue with Duoneb every 4 hrs prn as well as combivent q4-6 hrs prn.  Cont NIV with 2L O2. Download in February showed good compliance. 87%. Tidal volume set at 600, IPAP usually at 20 cm water, CPAP usually at 5 cm water.  Patient was instructed to rinse mouth each time he/she uses : Symbicort.   Influenza vaccine -- 2017 Pneumococcal vaccine -- he got it last 1-2 yrs.  He said he is UTD  Patient was instructed to call the office if he/she has issues with the medications.   Patient was instructed to call the office if he/she is having issues with shortness of breath (i.e. COPD exacerbation).   Patient was instructed to call the office if with increased shortness of breath, cough, sputum production, fevers. Patient may need to be seen at the office for work-up.   Counseled patient on the importance of NOT smoking.     OSA on CPAP Continue NIV therapy with 2 L oxygen. Feels better using it. More energy. Less sleepiness. He gets supplies. ONO on 2L and NIV >> no  desaturation  Lung nodules Chest CT scan in April 2018: Subcentimeter nodules bilaterally. Plan for chest CT scan in 04//2019. Significant exposure history.   Return to clinic after chest ct scan   J. Shirl Harris, MD 12/17/2016, 3:48 PM [facility] Pager [phone] After 3 pm or if no answer, call [phone]

## 2016-12-17 NOTE — Assessment & Plan Note (Addendum)
Pt has severe COPD based on PFTs in 2015. His FEV1 was 1.19 or 38% with significant bronchodilator response. Diffusion capacity was 65%. During that time, he did not feel better under Breo. Patient also has sleep apnea for which CPAP therapy was prescribed together with 2 L oxygen.  Through the years, his dyspnea has slowly gotten worse. Nowadays, he gets winded with doing his usual ADLs. Denies any recent admission or any exacerbation of COPD. He ends up using DuoNeb 2-3 times a day as well as Combivent once a day.   Patient has a 36 PY smoking history, quit in his 67s.  He was born and raised in [place] and was exposed to coal dust.  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  He worked for [employer] from 1987 for 10 years.  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 He was in the service from 1955 until 1975. He was in [place], [place]. 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   Pt's SOB is better. He did not tolerate the twice a day dose of Symbicort. Started having sore throat and cough. He is better on once a day Symbicort.  PFT done 12/17/16 >> FEV1  1.31  45% CXR (09/2016)  >> COPD changes.  Chest CT scan (April 2018) >> several subcm nodules related to smoking Alpha one level was normal.  ONO on 2L NIV >> no significant desatn   Plan : We extensively discussed the diagnosis, pathophysiology, and medications  for COPD.    Cont Symbicort, 160/4.5, 2 puffs  a day. Consider Trelegy on f/u If he gets more symptomatic. He is happy with the way his dyspnea is. He is satisfied with his COPD treatment.  Continue with Duoneb every 4 hrs prn as well as combivent q4-6 hrs prn.  Cont NIV with 2L O2. Download in February showed good compliance. 87%. Tidal volume set at 600, IPAP usually at 20 cm water, CPAP usually at 5 cm water.  Patient was instructed to rinse  mouth each time he/she uses : Symbicort.   Influenza vaccine -- 2017 Pneumococcal vaccine -- he got it last 1-2 yrs.  He said he is UTD  Patient was instructed to call the office if he/she has issues with the medications.   Patient was instructed to call the office if he/she is having issues with shortness of breath (i.e. COPD exacerbation).   Patient was instructed to call the office if with increased shortness of breath, cough, sputum production, fevers. Patient may need to be seen at the office for work-up.   Counseled patient on the importance of NOT smoking.

## 2016-12-17 NOTE — Assessment & Plan Note (Signed)
Chest CT scan in April 2018: Subcentimeter nodules bilaterally. Plan for chest CT scan in 04//2019. Significant exposure history.

## 2016-12-17 NOTE — Assessment & Plan Note (Addendum)
Continue NIV therapy with 2 L oxygen. Feels better using it. More energy. Less sleepiness. He gets supplies. ONO on 2L and NIV >> no desaturation

## 2016-12-21 ENCOUNTER — Ambulatory Visit (INDEPENDENT_AMBULATORY_CARE_PROVIDER_SITE_OTHER): Payer: Medicare Other | Admitting: Family Medicine

## 2016-12-21 ENCOUNTER — Encounter: Payer: Self-pay | Admitting: Family Medicine

## 2016-12-21 VITALS — BP 122/78 | HR 78 | Resp 18 | Wt 258.8 lb

## 2016-12-21 DIAGNOSIS — M1990 Unspecified osteoarthritis, unspecified site: Secondary | ICD-10-CM | POA: Diagnosis not present

## 2016-12-21 DIAGNOSIS — M542 Cervicalgia: Secondary | ICD-10-CM

## 2016-12-21 DIAGNOSIS — I201 Angina pectoris with documented spasm: Secondary | ICD-10-CM

## 2016-12-21 MED ORDER — CYCLOBENZAPRINE HCL 5 MG PO TABS: 5.0000 mg | ORAL_TABLET | Freq: Three times a day (TID) | ORAL | 0 refills | Status: DC | PRN

## 2016-12-21 NOTE — Progress Notes (Signed)
   Subjective:    Patient ID: Curtis Riley, male    DOB: Dec 10, 1934, 82 y.o.   MRN: [id number]  HPI He has been having difficulty with neck pain for the last several weeks. He notes that it occurs on both sides and does not have radiation. He notes difficulty with range of motion of his neck. He has been using topical medication as well as ibuprofen and heat. He thinks it is slightly better today.   Review of Systems     Objective:   Physical Exam Alert and in no distress. Limited range of motion of the neck with extension as well as left and right lateral motion. Normal motor, sensory and DTRs of the arms.       Assessment & Plan:  Neck pain - Plan: cyclobenzaprine (FLEXERIL) 5 MG tablet  Arthritis I explained that this is probably related to underlying arthritis. Recommend heat for 20 minutes 3 times per day, stretching after using the heat, ibuprofen for relief and will also call in the muscle relaxer to be used mainly at night. Continued difficulty we'll refer to physical therapy.

## 2016-12-21 NOTE — Patient Instructions (Signed)
Heat for 20 minutes 3 times per day and then gentle stretching after that. He did take 800 mg of that 3 times a day. Use the muscle relaxer mainly at night

## 2016-12-22 ENCOUNTER — Telehealth: Payer: Self-pay

## 2016-12-22 DIAGNOSIS — M542 Cervicalgia: Secondary | ICD-10-CM

## 2016-12-22 MED ORDER — CYCLOBENZAPRINE HCL 5 MG PO TABS: 5.0000 mg | ORAL_TABLET | Freq: Three times a day (TID) | ORAL | 0 refills | Status: DC | PRN

## 2016-12-22 NOTE — Telephone Encounter (Signed)
Order for flexeril called into to [facility].

## 2016-12-28 ENCOUNTER — Other Ambulatory Visit: Payer: Medicare Other

## 2016-12-28 DIAGNOSIS — R944 Abnormal results of kidney function studies: Secondary | ICD-10-CM | POA: Diagnosis not present

## 2016-12-28 LAB — COMPREHENSIVE METABOLIC PANEL
ALK PHOS: 55 U/L (ref 40–115)
ALT: 14 U/L (ref 9–46)
AST: 27 U/L (ref 10–35)
Albumin: 3.4 g/dL — ABNORMAL LOW (ref 3.6–5.1)
BILIRUBIN TOTAL: 0.8 mg/dL (ref 0.2–1.2)
BUN: 15 mg/dL (ref 7–25)
CO2: 17 mmol/L — AB (ref 20–31)
Calcium: 8.9 mg/dL (ref 8.6–10.3)
Chloride: 108 mmol/L (ref 98–110)
Creat: 1.55 mg/dL — ABNORMAL HIGH (ref 0.70–1.18)
GLUCOSE: 96 mg/dL (ref 65–99)
Potassium: 4.9 mmol/L (ref 3.5–5.3)
Sodium: 139 mmol/L (ref 135–146)
Total Protein: 6.6 g/dL (ref 6.1–8.1)

## 2017-03-13 ENCOUNTER — Other Ambulatory Visit: Payer: Self-pay | Admitting: Internal Medicine

## 2017-03-15 NOTE — Telephone Encounter (Signed)
Rx(s) sent to pharmacy electronically.  

## 2017-03-23 ENCOUNTER — Ambulatory Visit (INDEPENDENT_AMBULATORY_CARE_PROVIDER_SITE_OTHER): Payer: Medicare Other | Admitting: Internal Medicine

## 2017-03-23 ENCOUNTER — Encounter: Payer: Self-pay | Admitting: Internal Medicine

## 2017-03-23 VITALS — BP 119/70 | HR 70 | Ht 76.0 in | Wt 255.4 lb

## 2017-03-23 DIAGNOSIS — Z789 Other specified health status: Secondary | ICD-10-CM

## 2017-03-23 DIAGNOSIS — R079 Chest pain, unspecified: Secondary | ICD-10-CM | POA: Insufficient documentation

## 2017-03-23 DIAGNOSIS — J449 Chronic obstructive pulmonary disease, unspecified: Secondary | ICD-10-CM | POA: Diagnosis not present

## 2017-03-23 DIAGNOSIS — G4733 Obstructive sleep apnea (adult) (pediatric): Secondary | ICD-10-CM | POA: Diagnosis not present

## 2017-03-23 DIAGNOSIS — Z9989 Dependence on other enabling machines and devices: Secondary | ICD-10-CM | POA: Diagnosis not present

## 2017-03-23 DIAGNOSIS — I201 Angina pectoris with documented spasm: Secondary | ICD-10-CM

## 2017-03-23 DIAGNOSIS — I10 Essential (primary) hypertension: Secondary | ICD-10-CM | POA: Diagnosis not present

## 2017-03-23 NOTE — Patient Instructions (Addendum)
Your physician has requested that you have a lexiscan myoview. For further information please visit [email or web address]. Please follow instruction sheet, as given.  Your physician recommends that you schedule a follow-up appointment with Dr. Hilty after your stress test.   

## 2017-03-23 NOTE — Progress Notes (Signed)
OFFICE NOTE  Chief Complaint:  Chest pain, left arm pain  Primary Care Physician: Denita Lung, MD  HPI:  Curtis Riley is a 82 year old gentleman with a history of cervical neuropathy and recent C3-C4 surgery. He also has dyslipidemia, hypertension, sleep apnea, on CPAP, and mild COPD. He underwent catheterization, as you know, in 2012 for chest pain. It was negative except for a small bridging segment in the LAD. When I last saw him he had 2 episodes of chest discomfort and I prescribed some nitroglycerin. He reports that he has not needed to take that in the past 6 months. He has had some increase in mucus and secretions related to his COPD and shortness of breath. He feels that that could be better controlled and is planning to see his primary care provider about that.   This Pfluger returns today for followup. He is generally doing well. He denies any chest pain. He has had progressive shortness of breath and recently has been approved for oxygen. He plans to wear that 24/7. He does struggle with some lower extremity edema and wears compression stockings.  I saw Curtis Riley back in the office today. He reports that he's had some progressive shortness of breath again, despite using oxygen and CPAP. He's had some worsening leg swelling, more than typical in the past. He wears compression stockings, however has also noted some weight gain and mild orthopnea. I'm concerned that there may be some overlying right heart failure.  I saw Curtis Riley back in the office today. He reports an improvement in his breathing. There was some signs of mild congestive heart failure, possibly right heart failure. Increase his diuretics seem to have helped although he developed a pneumonia and was hospitalized. Subsequently was discharged on antibiotics which he's completed and a nebulizer. He was ordered have an echocardiogram however the LV was not well visualized due to his COPD, therefore no EF was  provided. I recommended a repeat limited echocardiogram with contrast however that was not yet obtained.   Curtis Riley returns today for follow-up. He reports no significant changes in his shortness of breath. He recently was admitted with a pneumonia and is recovered from that. Aced occasionally gets short-lived atypical chest pains. He did have chronic catheterization 2012 which showed no obstructive coronary disease.  12/06/2015  Curtis Riley returns for follow-up. He reports shortness of breath with minimal exertion however relates this to COPD. In fact she's lost about 6 pounds. There is no evidence that he's had any worsening diastolic heart failure. He continues on Lasix 40 mg daily. He's also noted some recent hearing loss in the right ear. Blood pressure is at goal today.  09/24/2016  Curtis Riley was again seen today in follow-up. Overall he continues to have shortness of breath with minimal exertion but he feels this is related to COPD. He does continue to wear oxygen at night. He tells me today that he does have a history of his spasticity exposure in the past and wonders if he could have lung disease related to this or whether it was related to smoking. I suspect this both. He does want to get screened to make sure he doesn't have any lung cancer such as mesothelioma or other findings. He is interested in a pulmonary referral which will place today per his request. He reports recently he's had more swelling than usual. He has been taking 80 mg of Lasix alternating with 40 mg of Lasix. He asked  to be easier to take 60 mg daily.  03/23/2017  Curtis Riley returns a for follow-up. He reports she's having some new left chest pain and left arm aching. These seem to be new symptoms over the past 6 months. Is not necessarily noting that worse with exertion or being relieved by rest. There were reports some stable dyspnea. He was put on another inhaler by his pulmonologist but he says is not necessarily  helped or improved his breathing. He mostly notes the chest discomfort at night. He says he wakes up in the middle the night with chest discomfort. He does wear CPAP and has been compliant with this. He had left heart catheterization in 10/2010 by Dr. Rex Kras which showed no occlusive coronary artery disease, but has not had ischemia testing since then. Unfortunately has low back pain and walks with a cane. He'll be unable to exercise on a treadmill.  PMHx:  Past Medical History:  Diagnosis Date  . Angina    occasional chest pain  never been treated for this ...   . Arthritis   . BPH (benign prostatic hyperplasia)   . COPD (chronic obstructive pulmonary disease) ([place])   . CVA (cerebral infarction)   . Diverticulosis   . Dyslipidemia   . Family history of coronary artery disease    brothers, sisters  . History of nuclear stress test 03/2010   dobutamine; mild ischemia in mid inferior and apical inferior regions; ekg negative for ischemia; low risk scan   . Hypertension   . Kidney stones    10-15 yrs ago  . Neuromuscular disorder ([place])   . Shortness of breath    with exertion   . Sleep apnea    uses cpap  . Smoker     Past Surgical History:  Procedure Laterality Date  . ANTERIOR CERVICAL DECOMP/DISCECTOMY FUSION  10/06/2011   Procedure: ANTERIOR CERVICAL DECOMPRESSION/DISCECTOMY FUSION 3 LEVELS;  Surgeon: Faythe Ghee, MD;  Location: [facility];  Service: Neurosurgery;  Laterality: Bilateral;  Cervical three-four, four-five, five-six anterior cervical decompression, fusion  . CARDIAC CATHETERIZATION  10/2010   small bridging segment in LAD  . TONSILLECTOMY  age 66  . TRANSTHORACIC ECHOCARDIOGRAM  2004   normal LV size and function, aortic root dilated  . TRANSURETHRAL RESECTION OF PROSTATE  08/1996  . TRANSURETHRAL RESECTION OF PROSTATE     long time ago per pt.  10-15 yrs    FAMHx:  Family History  Problem Relation Age of Onset  . Arthritis Mother   . Hypertension Mother     . Arthritis Father   . Hypertension Father   . Arthritis Sister   . Diabetes Sister   . Hypertension Sister   . Arthritis Brother   . Hypertension Brother     SOCHx:   reports that he quit smoking about 18 years ago. His smoking use included Cigarettes. He has a 20.00 pack-year smoking history. He has never used smokeless tobacco. He reports that he does not drink alcohol or use drugs.  ALLERGIES:  Allergies  Allergen Reactions  . Penicillins Anaphylaxis    ROS: Pertinent items noted in HPI and remainder of comprehensive ROS otherwise negative.  HOME MEDS: Current Outpatient Prescriptions  Medication Sig Dispense Refill  . aspirin 81 MG tablet Take 81 mg by mouth daily.    . budesonide-formoterol (SYMBICORT) 160-4.5 MCG/ACT inhaler Inhale 2 puffs into the lungs 2 (two) times daily. 3 Inhaler 3  . carvedilol (COREG) 25 MG tablet Take 1 tablet (  25 mg total) by mouth 2 (two) times daily. 180 tablet 0  . COMBIVENT RESPIMAT 20-100 MCG/ACT AERS respimat USE 1 INHALATION EVERY 6 HOURS 12 g 11  . cyclobenzaprine (FLEXERIL) 5 MG tablet Take 1 tablet (5 mg total) by mouth 3 (three) times daily as needed for muscle spasms. 12 tablet 0  . furosemide (LASIX) 40 MG tablet Take 1.5 tablets (60mg ) by mouth daily 135 tablet 3  . Green Tea, Camillia sinensis, (GREEN TEA PO) Take 1 tablet by mouth daily.    Marland Kitchen ibuprofen (ADVIL,MOTRIN) 200 MG tablet Take by mouth every 6 (six) hours as needed for mild pain.     Marland Kitchen ipratropium-albuterol (DUONEB) 0.5-2.5 (3) MG/3ML SOLN Take 2-3 times daily as needed    . lisinopril (PRINIVIL,ZESTRIL) 40 MG tablet Take 1 tablet (40 mg total) by mouth daily. 90 tablet 3  . nitroGLYCERIN (NITROSTAT) 0.4 MG SL tablet Place 1 tablet (0.4 mg total) under the tongue every 5 (five) minutes as needed for chest pain. 25 tablet 3  . NON FORMULARY at bedtime. CPAP    . potassium chloride (K-DUR) 10 MEQ tablet Take 1 tablet (10 mEq total) by mouth daily. 90 tablet 3   No current  facility-administered medications for this visit.     LABS/IMAGING: No results found for this or any previous visit (from the past 48 hour(s)). No results found.  VITALS: BP 119/70 (BP Location: Right Arm)   Pulse 70   Ht 6\' 4"  (1.93 m)   Wt 255 lb 6.4 oz (115.8 kg)   BMI 31.09 kg/m   EXAM: General appearance: alert and no distress Neck: no carotid bruit, no JVD and Cerumen impaction in the right ear canal Lungs: clear to auscultation bilaterally Heart: regular rate and rhythm, S1, S2 normal, no murmur, click, rub or gallop Abdomen: soft, non-tender; bowel sounds normal; no masses,  no organomegaly Extremities: extremities normal, atraumatic, no cyanosis or edema Pulses: 2+ and symmetric Skin: Skin color, texture, turgor normal. No rashes or lesions Neurologic: Grossly normal  EKG: Normal sinus rhythm at 70, low voltage QRS, nonspecific T-wave changes-personally reviewed  ASSESSMENT: 1. Left chest and arm pain-no significant obstructive coronary disease by cath 2012 2. Hypertension-controlled 3. Dyslipidemia 4. COPD - now on oxygen 5. OSA on CPAP 6. History of asbestos exposure  PLAN: 1.   Curtis Riley has recently been having some worsening left chest and left arm pain. There are multiple possible etiologies for this however he says is waking him up at night. He is wearing his CPAP. It seems to be worse over the past 6 months. He had no significant coronary disease by cath in 2012 however it's been 6 years since that study. Given his new symptoms, a repeat ischemia evaluation is warranted. The report some mild increase in his lower extremity edema. I'll plan to get a Lexiscan Myoview is is unable to walk on a treadmill. She give [place] information on LV function as well. I advise continued wearing of compression stockings and Lasix. Follow-up with me afterwards.  Pixie Casino, MD, [facility] Attending Cardiologist [place] 03/23/2017, 1:46 PM

## 2017-03-29 ENCOUNTER — Other Ambulatory Visit: Payer: Self-pay | Admitting: Family Medicine

## 2017-03-30 NOTE — Telephone Encounter (Signed)
Is this okay to refill? 

## 2017-03-31 ENCOUNTER — Ambulatory Visit (HOSPITAL_COMMUNITY)
Admission: RE | Admit: 2017-03-31 | Discharge: 2017-03-31 | Disposition: A | Discharge status: Home / Self Care | Payer: Medicare Other | Source: Ambulatory Visit | Attending: Cardiovascular Disease | Admitting: Cardiovascular Disease

## 2017-03-31 DIAGNOSIS — Z789 Other specified health status: Secondary | ICD-10-CM | POA: Diagnosis not present

## 2017-03-31 DIAGNOSIS — J449 Chronic obstructive pulmonary disease, unspecified: Secondary | ICD-10-CM | POA: Diagnosis not present

## 2017-03-31 DIAGNOSIS — Z8249 Family history of ischemic heart disease and other diseases of the circulatory system: Secondary | ICD-10-CM | POA: Diagnosis not present

## 2017-03-31 DIAGNOSIS — R0609 Other forms of dyspnea: Secondary | ICD-10-CM | POA: Diagnosis not present

## 2017-03-31 DIAGNOSIS — I2510 Atherosclerotic heart disease of native coronary artery without angina pectoris: Secondary | ICD-10-CM | POA: Diagnosis not present

## 2017-03-31 DIAGNOSIS — Z8673 Personal history of transient ischemic attack (TIA), and cerebral infarction without residual deficits: Secondary | ICD-10-CM | POA: Insufficient documentation

## 2017-03-31 DIAGNOSIS — R079 Chest pain, unspecified: Secondary | ICD-10-CM | POA: Insufficient documentation

## 2017-03-31 DIAGNOSIS — G4733 Obstructive sleep apnea (adult) (pediatric): Secondary | ICD-10-CM | POA: Insufficient documentation

## 2017-03-31 DIAGNOSIS — I509 Heart failure, unspecified: Secondary | ICD-10-CM | POA: Diagnosis not present

## 2017-03-31 DIAGNOSIS — Z87891 Personal history of nicotine dependence: Secondary | ICD-10-CM | POA: Insufficient documentation

## 2017-03-31 DIAGNOSIS — I110 Hypertensive heart disease with heart failure: Secondary | ICD-10-CM | POA: Insufficient documentation

## 2017-03-31 MED ORDER — TECHNETIUM TC 99M TETROFOSMIN IV KIT
10.4000 | PACK | Freq: Once | INTRAVENOUS | Status: AC | PRN
  Administered 2017-03-31: 10.4 via INTRAVENOUS
  Filled 2017-03-31: qty 11

## 2017-03-31 MED ORDER — TECHNETIUM TC 99M TETROFOSMIN IV KIT
32.1000 | PACK | Freq: Once | INTRAVENOUS | Status: AC | PRN
  Administered 2017-03-31: 32.1 via INTRAVENOUS
  Filled 2017-03-31: qty 33

## 2017-03-31 MED ORDER — REGADENOSON 0.4 MG/5ML IV SOLN
0.4000 mg | Freq: Once | INTRAVENOUS | Status: AC
  Administered 2017-03-31: 0.4 mg via INTRAVENOUS

## 2017-04-01 LAB — MYOCARDIAL PERFUSION IMAGING
CHL CUP NUCLEAR SDS: 0
CHL CUP NUCLEAR SSS: 9
CSEPPHR: 87 {beats}/min
LV dias vol: 150 mL (ref 62–150)
LV sys vol: 78 mL
NUC STRESS TID: 1.01
Rest HR: 63 {beats}/min
SRS: 9

## 2017-04-29 ENCOUNTER — Encounter: Payer: Self-pay | Admitting: Internal Medicine

## 2017-04-29 ENCOUNTER — Ambulatory Visit (INDEPENDENT_AMBULATORY_CARE_PROVIDER_SITE_OTHER): Payer: Medicare Other | Admitting: Internal Medicine

## 2017-04-29 VITALS — BP 104/70 | HR 76 | Ht 76.0 in | Wt 251.0 lb

## 2017-04-29 DIAGNOSIS — J449 Chronic obstructive pulmonary disease, unspecified: Secondary | ICD-10-CM

## 2017-04-29 DIAGNOSIS — I201 Angina pectoris with documented spasm: Secondary | ICD-10-CM

## 2017-04-29 DIAGNOSIS — I10 Essential (primary) hypertension: Secondary | ICD-10-CM | POA: Diagnosis not present

## 2017-04-29 DIAGNOSIS — R0600 Dyspnea, unspecified: Secondary | ICD-10-CM

## 2017-04-29 DIAGNOSIS — Z77090 Contact with and (suspected) exposure to asbestos: Secondary | ICD-10-CM

## 2017-04-29 MED ORDER — FUROSEMIDE 40 MG PO TABS: ORAL_TABLET | ORAL | 3 refills | Status: DC

## 2017-04-29 NOTE — Progress Notes (Signed)
OFFICE NOTE  Chief Complaint:  Dyspnea is worse  Primary Care Physician: Denita Lung, MD  HPI:  Curtis Riley is a 82 year old gentleman with a history of cervical neuropathy and recent C3-C4 surgery. He also has dyslipidemia, hypertension, sleep apnea, on CPAP, and mild COPD. He underwent catheterization, as you know, in 2012 for chest pain. It was negative except for a small bridging segment in the LAD. When I last saw him he had 2 episodes of chest discomfort and I prescribed some nitroglycerin. He reports that he has not needed to take that in the past 6 months. He has had some increase in mucus and secretions related to his COPD and shortness of breath. He feels that that could be better controlled and is planning to see his primary care provider about that.   This Riley returns today for followup. He is generally doing well. He denies any chest pain. He has had progressive shortness of breath and recently has been approved for oxygen. He plans to wear that 24/7. He does struggle with some lower extremity edema and wears compression stockings.  I saw Curtis Riley back in the office today. He reports that he's had some progressive shortness of breath again, despite using oxygen and CPAP. He's had some worsening leg swelling, more than typical in the past. He wears compression stockings, however has also noted some weight gain and mild orthopnea. I'm concerned that there may be some overlying right heart failure.  I saw Curtis Riley back in the office today. He reports an improvement in his breathing. There was some signs of mild congestive heart failure, possibly right heart failure. Increase his diuretics seem to have helped although he developed a pneumonia and was hospitalized. Subsequently was discharged on antibiotics which he's completed and a nebulizer. He was ordered have an echocardiogram however the LV was not well visualized due to his COPD, therefore no EF was provided. I  recommended a repeat limited echocardiogram with contrast however that was not yet obtained.   Curtis Riley returns today for follow-up. He reports no significant changes in his shortness of breath. He recently was admitted with a pneumonia and is recovered from that. Aced occasionally gets short-lived atypical chest pains. He did have chronic catheterization 2012 which showed no obstructive coronary disease.  12/06/2015  Curtis Riley returns for follow-up. He reports shortness of breath with minimal exertion however relates this to COPD. In fact she's lost about 6 pounds. There is no evidence that he's had any worsening diastolic heart failure. He continues on Lasix 40 mg daily. He's also noted some recent hearing loss in the right ear. Blood pressure is at goal today.  09/24/2016  Curtis Riley was again seen today in follow-up. Overall he continues to have shortness of breath with minimal exertion but he feels this is related to COPD. He does continue to wear oxygen at night. He tells me today that he does have a history of his spasticity exposure in the past and wonders if he could have lung disease related to this or whether it was related to smoking. I suspect this both. He does want to get screened to make sure he doesn't have any lung cancer such as mesothelioma or other findings. He is interested in a pulmonary referral which will place today per his request. He reports recently he's had more swelling than usual. He has been taking 80 mg of Lasix alternating with 40 mg of Lasix. He asked to be  easier to take 60 mg daily.  03/23/2017  Curtis Riley returns a for follow-up. He reports she's having some new left chest pain and left arm aching. These seem to be new symptoms over the past 6 months. Is not necessarily noting that worse with exertion or being relieved by rest. There were reports some stable dyspnea. He was put on another inhaler by his pulmonologist but he says is not necessarily helped or  improved his breathing. He mostly notes the chest discomfort at night. He says he wakes up in the middle the night with chest discomfort. He does wear CPAP and has been compliant with this. He had left heart catheterization in 10/2010 by Dr. Rex Kras which showed no occlusive coronary artery disease, but has not had ischemia testing since then. Unfortunately has low back pain and walks with a cane. He'll be unable to exercise on a treadmill.  04/29/2017  Curtis Riley was seen today in follow-up. He underwent nuclear stress testing for chest pain which she says is improved however he's had worsening shortness of breath. He's also had worsening lower extremity edema. Stress test was negative for ischemia however showed an EF of 49%. I suspect this is a gating abnormality. His shortness of breath may be worsening pulmonary disease. He was previously placed on Symbicort but discontinued it. He's using a Combivent inhaler but very frequently. He has not returned to see pulmonary and his pulmonologist has since left the practice. I suspect the EF may be higher than the gated EF on his nuclear stress test. He may benefit from some more Lasix and we discussed increasing that today.   PMHx:  Past Medical History:  Diagnosis Date  . Angina    occasional chest pain  never been treated for this ...   . Arthritis   . BPH (benign prostatic hyperplasia)   . COPD (chronic obstructive pulmonary disease) ([place])   . CVA (cerebral infarction)   . Diverticulosis   . Dyslipidemia   . Family history of coronary artery disease    brothers, sisters  . History of nuclear stress test 03/2010   dobutamine; mild ischemia in mid inferior and apical inferior regions; ekg negative for ischemia; low risk scan   . Hypertension   . Kidney stones    10-15 yrs ago  . Neuromuscular disorder ([place])   . Shortness of breath    with exertion   . Sleep apnea    uses cpap  . Smoker     Past Surgical History:  Procedure Laterality  Date  . ANTERIOR CERVICAL DECOMP/DISCECTOMY FUSION  10/06/2011   Procedure: ANTERIOR CERVICAL DECOMPRESSION/DISCECTOMY FUSION 3 LEVELS;  Surgeon: Faythe Ghee, MD;  Location: [facility];  Service: Neurosurgery;  Laterality: Bilateral;  Cervical three-four, four-five, five-six anterior cervical decompression, fusion  . CARDIAC CATHETERIZATION  10/2010   small bridging segment in LAD  . TONSILLECTOMY  age 17  . TRANSTHORACIC ECHOCARDIOGRAM  2004   normal LV size and function, aortic root dilated  . TRANSURETHRAL RESECTION OF PROSTATE  08/1996  . TRANSURETHRAL RESECTION OF PROSTATE     long time ago per pt.  10-15 yrs    FAMHx:  Family History  Problem Relation Age of Onset  . Arthritis Mother   . Hypertension Mother   . Arthritis Father   . Hypertension Father   . Arthritis Sister   . Diabetes Sister   . Hypertension Sister   . Arthritis Brother   . Hypertension Brother  SOCHx:   reports that he quit smoking about 18 years ago. His smoking use included Cigarettes. He has a 20.00 pack-year smoking history. He has never used smokeless tobacco. He reports that he does not drink alcohol or use drugs.  ALLERGIES:  Allergies  Allergen Reactions  . Penicillins Anaphylaxis    ROS: Pertinent items noted in HPI and remainder of comprehensive ROS otherwise negative.  HOME MEDS: Current Outpatient Prescriptions  Medication Sig Dispense Refill  . aspirin 81 MG tablet Take 81 mg by mouth daily.    . budesonide-formoterol (SYMBICORT) 160-4.5 MCG/ACT inhaler Inhale 2 puffs into the lungs 2 (two) times daily. 3 Inhaler 3  . carvedilol (COREG) 25 MG tablet Take 1 tablet (25 mg total) by mouth 2 (two) times daily. 180 tablet 0  . COMBIVENT RESPIMAT 20-100 MCG/ACT AERS respimat USE 1 INHALATION EVERY 6 HOURS 12 g 11  . furosemide (LASIX) 40 MG tablet Take 1 tablet by mouth twice daily. 180 tablet 3  . Green Tea, Camillia sinensis, (GREEN TEA PO) Take 1 tablet by mouth daily.    Marland Kitchen  ibuprofen (ADVIL,MOTRIN) 200 MG tablet Take by mouth every 6 (six) hours as needed for mild pain.     Marland Kitchen ipratropium-albuterol (DUONEB) 0.5-2.5 (3) MG/3ML SOLN INHALE 1 VIAL VIA NEBULIZER EVERY 4 HOURS AS NEEDED 1080 mL 3  . lisinopril (PRINIVIL,ZESTRIL) 40 MG tablet Take 1 tablet (40 mg total) by mouth daily. 90 tablet 3  . nitroGLYCERIN (NITROSTAT) 0.4 MG SL tablet Place 1 tablet (0.4 mg total) under the tongue every 5 (five) minutes as needed for chest pain. 25 tablet 3  . NON FORMULARY at bedtime. CPAP    . potassium chloride (K-DUR) 10 MEQ tablet Take 1 tablet (10 mEq total) by mouth daily. 90 tablet 3   No current facility-administered medications for this visit.     LABS/IMAGING: No results found for this or any previous visit (from the past 48 hour(s)). No results found.  VITALS: BP 104/70   Pulse 76   Ht 6\' 4"  (1.93 m)   Wt 251 lb (113.9 kg)   BMI 30.55 kg/m   EXAM: Deferred  EKG: Deferred  ASSESSMENT: 1. Left chest and arm pain-no significant obstructive coronary disease by cath 2012 - low risk myoview, EF 49% (03/2017) 2. Hypertension-controlled 3. Dyslipidemia 4. COPD - now on oxygen 5. OSA on CPAP 6. History of asbestos exposure  PLAN: 1.   Mr. Recinos is describing worsening shortness of breath and less significant chest pressure. He had a low risk Myoview with a low normal EF which could've been a gating abnormality. He may benefit from increase in Lasix due to persistent swelling and I've increased his Lasix now to 40 mg twice a day. He is using oxygen which is related to CPAP at night and would likely benefit from additional pulmonary function testing and referral back to pulmonary.  Follow-up with me 3 months.  Pixie Casino, MD, [facility] Attending Cardiologist [place] 04/29/2017, 2:09 PM

## 2017-04-29 NOTE — Patient Instructions (Addendum)
Your physician has recommended you make the following change in your medication: INCREASE lasix to 40mg  twice daily  You have been referred to [name], MD (pulmonary)  Your physician wants you to follow-up in: 3 months with Dr. Debara Pickett. You will receive a reminder letter in the mail two months in advance. If you don't receive a letter, please call our office to schedule the follow-up appointment.

## 2017-05-11 ENCOUNTER — Encounter: Payer: Self-pay | Admitting: Pulmonary Disease

## 2017-05-11 ENCOUNTER — Ambulatory Visit (INDEPENDENT_AMBULATORY_CARE_PROVIDER_SITE_OTHER): Payer: Medicare Other | Admitting: Pulmonary Disease

## 2017-05-11 VITALS — BP 142/80 | HR 74 | Ht 76.0 in | Wt 255.0 lb

## 2017-05-11 DIAGNOSIS — R911 Solitary pulmonary nodule: Secondary | ICD-10-CM | POA: Diagnosis not present

## 2017-05-11 DIAGNOSIS — Z01818 Encounter for other preprocedural examination: Secondary | ICD-10-CM | POA: Diagnosis not present

## 2017-05-11 DIAGNOSIS — I201 Angina pectoris with documented spasm: Secondary | ICD-10-CM

## 2017-05-11 DIAGNOSIS — Z23 Encounter for immunization: Secondary | ICD-10-CM

## 2017-05-11 MED ORDER — FLUTICASONE-UMECLIDIN-VILANT 100-62.5-25 MCG/INH IN AEPB: 1.0000 | INHALATION_SPRAY | Freq: Every day | RESPIRATORY_TRACT | 0 refills | Status: DC

## 2017-05-11 NOTE — Progress Notes (Signed)
Subjective:    Patient ID: Curtis Riley, male    DOB: 12/24/1934, 82 y.o.   MRN: [id number]  Former patient of [name] who has COPD and chronic respiratory failure with hypoxemia.  Also has obstructive sleep apnea on CPAP [name] described his situation as follows: Patient has a 76 PY smoking history, quit in his 37s.  He was born and raised in [place] and was exposed to coal dust.  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  He worked for [employer] from 1987 for 10 years.  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 He was in the service from 1955 until 1975. He was in [place], [place], [place]. 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   HPI Chief Complaint  Patient presents with  . Advice Only    Former AD pt- re-refered back to clinic by Dr. Debara Pickett for COPD. CAT: 72.   [name] was referred back to see [place] for his COPD by his cardiologist.  He tells me that he thinks that his breathing problems may have worsened since the last visit.  He says that since then he has been having sore throat problems with the symbicort so he stopped taking it.  However after he spoke to [facility] he was advised to start using the symbicort again so he did this.  He says that he doesn't think his breathing has improved much, but he's happy that he doesn't have the sore throat.    Dyspnea: > he says there is very little he can do without getting short of breath > he is limited by his back pain as well as his lung problems.   > he says that he has a lot of foot and ankle swelling > no change in breathing with weather changes > he tries to avoid smoke > hot weather will make him more short of breath  He has been prescribed CPAP and an oxygen concentrator.  Rare cough if ever, perhaps a little mucus production in the mornings.    He gets a lot of sinus congestion and notes that  food will make his nose run.     Past Medical History:  Diagnosis Date  . Angina    occasional chest pain  never been treated for this ...   . Arthritis   . BPH (benign prostatic hyperplasia)   . COPD (chronic obstructive pulmonary disease) ([place])   . CVA (cerebral infarction)   . Diverticulosis   . Dyslipidemia   . Family history of coronary artery disease    brothers, sisters  . History of nuclear stress test 03/2010   dobutamine; mild ischemia in mid inferior and apical inferior regions; ekg negative for ischemia; low risk scan   . Hypertension   . Kidney stones    10-15 yrs ago  . Neuromuscular disorder ([place])   . Shortness of breath    with exertion   . Sleep apnea    uses cpap  . Smoker      Family History  Problem Relation Age of Onset  . Arthritis Mother   . Hypertension Mother   . Arthritis Father   . Hypertension Father   . Arthritis Sister   . Diabetes Sister   . Hypertension Sister   . Arthritis Brother   . Hypertension Brother  Social History   Social History  . Marital status: Divorced    Spouse name: N/A  . Number of children: 2  . Years of education: N/A   Occupational History  . Not on file.   Social History Main Topics  . Smoking status: Former Smoker    Packs/day: 1.00    Years: 40.00    Types: Cigarettes    Quit date: 07/20/1998  . Smokeless tobacco: Never Used  . Alcohol use No  . Drug use: No  . Sexual activity: Not on file   Other Topics Concern  . Not on file   Social History Narrative  . No narrative on file     Allergies  Allergen Reactions  . Penicillins Anaphylaxis     Outpatient Medications Prior to Visit  Medication Sig Dispense Refill  . aspirin 81 MG tablet Take 81 mg by mouth daily.    . budesonide-formoterol (SYMBICORT) 160-4.5 MCG/ACT inhaler Inhale 2 puffs into the lungs 2 (two) times daily. 3 Inhaler 3  . carvedilol (COREG) 25 MG tablet Take 1 tablet (25 mg total) by mouth 2 (two) times daily. 180  tablet 0  . COMBIVENT RESPIMAT 20-100 MCG/ACT AERS respimat USE 1 INHALATION EVERY 6 HOURS 12 g 11  . furosemide (LASIX) 40 MG tablet Take 1 tablet by mouth twice daily. 180 tablet 3  . Green Tea, Camillia sinensis, (GREEN TEA PO) Take 1 tablet by mouth daily.    Marland Kitchen ibuprofen (ADVIL,MOTRIN) 200 MG tablet Take by mouth every 6 (six) hours as needed for mild pain.     Marland Kitchen ipratropium-albuterol (DUONEB) 0.5-2.5 (3) MG/3ML SOLN INHALE 1 VIAL VIA NEBULIZER EVERY 4 HOURS AS NEEDED 1080 mL 3  . lisinopril (PRINIVIL,ZESTRIL) 40 MG tablet Take 1 tablet (40 mg total) by mouth daily. 90 tablet 3  . nitroGLYCERIN (NITROSTAT) 0.4 MG SL tablet Place 1 tablet (0.4 mg total) under the tongue every 5 (five) minutes as needed for chest pain. 25 tablet 3  . NON FORMULARY at bedtime. CPAP    . potassium chloride (K-DUR) 10 MEQ tablet Take 1 tablet (10 mEq total) by mouth daily. 90 tablet 3   No facility-administered medications prior to visit.       Review of Systems  Constitutional: Negative for fever and unexpected weight change.  HENT: Negative for congestion, dental problem, ear pain, nosebleeds, postnasal drip, rhinorrhea, sinus pressure, sneezing, sore throat and trouble swallowing.   Eyes: Negative for redness and itching.  Respiratory: Positive for shortness of breath. Negative for cough, chest tightness and wheezing.   Cardiovascular: Negative for palpitations and leg swelling.  Gastrointestinal: Negative for nausea and vomiting.  Genitourinary: Negative for dysuria.  Musculoskeletal: Negative for joint swelling.  Skin: Negative for rash.  Neurological: Negative for headaches.  Hematological: Does not bruise/bleed easily.  Psychiatric/Behavioral: Negative for dysphoric mood. The patient is not nervous/anxious.        Objective:   Physical Exam  Vitals:   05/11/17 1442  BP: (!) 142/80  Pulse: 74  SpO2: 95%  Weight: 255 lb (115.7 kg)  Height: 6\' 4"  (1.93 m)   Gen: well appearing, no acute  distress HENT: NCAT, OP clear, neck supple without masses Eyes: PERRL, EOMi Lymph: no cervical lymphadenopathy PULM: CTA B CV: RRR, no mgr, no JVD GI: BS+, soft, nontender, no hsm Derm: no rash or skin breakdown MSK: normal bulk and tone Neuro: A&Ox4, CN II-XII intact, strength 5/5 in all 4 extremities Psyche: normal mood and affect  Chest imaging: April 2018 CT chest images independently reviewed showing mild to moderate centrilobular emphysema and multiple scattered pulmonary nodules, they haven't changed since prior;   Pulmonary function test: May 2018 ratio 51%, FEV1 1.36 L 46% predicted, FVC 2.64 L 66% predicted, total lung capacity 8.7 L 116% predicted, residual volume 200% predicted, DLCO 26.05 74% predicted      Assessment & Plan:   Pre-procedural examination - Plan: CANCELED: Basic Metabolic Panel (BMET)  Solitary pulmonary nodule - Plan: CANCELED: CT Chest W Contrast  Encounter for immunization - Plan: Flu vaccine HIGH DOSE PF  Discussion: Curtis Riley comes back to clinic today complaining of shortness of breath which is primarily due to his severe COPD and physical deconditioning.  He has been noncompliant with Symbicort until recently and he says that this is not been as helpful.  Given the severity of his airflow obstruction and air trapping seen on lung function testing I think the addition of an antimuscarinic would be helpful.  I will change his Symbicort to the Trelegy in sample form today.  More concerning is what appears to be an endobronchial lesion seen in the right lower lobe.  I called radiology today to discuss this finding and they feel that this is likely a true abnormality and they recommend a repeat CT chest now.  If this shows persistent endobronchial lesion he will need to have a bronchoscopy considering his smoking history.  After discussing with him at length today he really doesn't want to undergo any more CT scans at this time.  He is willing to  reconsider this by the next visit.  Plan: For the pulmonary nodule and right lower lobe endobronchial nodule: As we discussed today in clinic I recommend that you have a repeat CT, if you reconsider your decision on this then please call me back and I will order the test between now and the next visit.  Otherwise we will plan on talking about it in the next visit.  For your COPD: I will give you a sample of Trelegy, take 1 puff a day no matter how you feel Do not take Symbicort while you are taking Trelegy Call me after you have taken the Trelegy for 1-2 weeks and let me know if it is making her feel better, if so then I will call in a new prescription Flu shot today  For 4 physical deconditioning: Try to stay as active as possible Walk 5 minutes daily, if this goes okay then in a week walk 6 minutes daily and saline  Obstructive sleep apnea: Keep taking CPAP nightly with oxygen  For dyspnea: We will check your oxygen level when you are walking around today  Follow up with me in 3-4 weeks or sooner if needed  > 50% of this 45 minute visit spent face to face    Current Outpatient Prescriptions:  .  aspirin 81 MG tablet, Take 81 mg by mouth daily., Disp: , Rfl:  .  budesonide-formoterol (SYMBICORT) 160-4.5 MCG/ACT inhaler, Inhale 2 puffs into the lungs 2 (two) times daily., Disp: 3 Inhaler, Rfl: 3 .  carvedilol (COREG) 25 MG tablet, Take 1 tablet (25 mg total) by mouth 2 (two) times daily., Disp: 180 tablet, Rfl: 0 .  COMBIVENT RESPIMAT 20-100 MCG/ACT AERS respimat, USE 1 INHALATION EVERY 6 HOURS, Disp: 12 g, Rfl: 11 .  furosemide (LASIX) 40 MG tablet, Take 1 tablet by mouth twice daily., Disp: 180 tablet, Rfl: 3 .  Green Tea, Camillia sinensis, (GREEN TEA  PO), Take 1 tablet by mouth daily., Disp: , Rfl:  .  ibuprofen (ADVIL,MOTRIN) 200 MG tablet, Take by mouth every 6 (six) hours as needed for mild pain. , Disp: , Rfl:  .  ipratropium-albuterol (DUONEB) 0.5-2.5 (3) MG/3ML SOLN,  INHALE 1 VIAL VIA NEBULIZER EVERY 4 HOURS AS NEEDED, Disp: 1080 mL, Rfl: 3 .  lisinopril (PRINIVIL,ZESTRIL) 40 MG tablet, Take 1 tablet (40 mg total) by mouth daily., Disp: 90 tablet, Rfl: 3 .  nitroGLYCERIN (NITROSTAT) 0.4 MG SL tablet, Place 1 tablet (0.4 mg total) under the tongue every 5 (five) minutes as needed for chest pain., Disp: 25 tablet, Rfl: 3 .  NON FORMULARY, at bedtime. CPAP, Disp: , Rfl:  .  potassium chloride (K-DUR) 10 MEQ tablet, Take 1 tablet (10 mEq total) by mouth daily., Disp: 90 tablet, Rfl: 3 .  Fluticasone-Umeclidin-Vilant (TRELEGY ELLIPTA) 100-62.5-25 MCG/INH AEPB, Inhale 1 puff into the lungs daily., Disp: 1 each, Rfl: 0

## 2017-05-11 NOTE — Patient Instructions (Addendum)
For the pulmonary nodule and right lower lobe endobronchial nodule: As we discussed today in clinic I recommend that you have a repeat CT, if you reconsider your decision on this then please call me back and I will order the test between now and the next visit.  Otherwise we will plan on talking about it in the next visit.  For your COPD: I will give you a sample of Trelegy, take 1 puff a day no matter how you feel Do not take Symbicort while you are taking Trelegy Call me after you have taken the Trelegy for 1-2 weeks and let me know if it is making her feel better, if so then I will call in a new prescription Flu shot today  For 4 physical deconditioning: Try to stay as active as possible Walk 5 minutes daily, if this goes okay then in a week walk 6 minutes daily and saline  Obstructive sleep apnea: Keep taking CPAP nightly with oxygen  For dyspnea: We will check your oxygen level when you are walking around today  Follow up with me in 3-4 weeks or sooner if needed

## 2017-05-12 ENCOUNTER — Institutional Professional Consult (permissible substitution): Payer: Medicare Other | Admitting: Pulmonary Disease

## 2017-05-31 ENCOUNTER — Ambulatory Visit (INDEPENDENT_AMBULATORY_CARE_PROVIDER_SITE_OTHER): Payer: Medicare Other | Admitting: Family Medicine

## 2017-05-31 ENCOUNTER — Encounter: Payer: Self-pay | Admitting: Family Medicine

## 2017-05-31 VITALS — BP 140/80 | HR 56 | Resp 16 | Wt 259.6 lb

## 2017-05-31 DIAGNOSIS — B353 Tinea pedis: Secondary | ICD-10-CM

## 2017-05-31 DIAGNOSIS — R609 Edema, unspecified: Secondary | ICD-10-CM | POA: Diagnosis not present

## 2017-05-31 DIAGNOSIS — I201 Angina pectoris with documented spasm: Secondary | ICD-10-CM

## 2017-05-31 DIAGNOSIS — B351 Tinea unguium: Secondary | ICD-10-CM

## 2017-05-31 MED ORDER — TERBINAFINE HCL 250 MG PO TABS: 250.0000 mg | ORAL_TABLET | Freq: Every day | ORAL | 1 refills | Status: DC

## 2017-05-31 NOTE — Progress Notes (Signed)
   Subjective:    Patient ID: Curtis Riley, male    DOB: 1935/04/21, 82 y.o.   MRN: [id number]  HPI He is here for consult concerning discomfort between all of his toes.  He does have a previous history of athlete's foot.  He has been treating this with some home remedies without much success.  He does have some edema in his feet from dependent edema making it difficult to wear shoes.   Review of Systems     Objective:   Physical Exam 2+ pitting edema is noted.  Several of his nails do show evidence of onychomycosis.  He does have some maceration between all of his toes.       Assessment & Plan:  Tinea pedis of both feet - Plan: terbinafine (LAMISIL) 250 MG tablet  Onychomycosis  Dependent edema  I will place him on Lamisil pills as well as recommend Lamisil topical.  Explained that treating the onychomycosis would be difficult and we would probably defer that although in a month will be interesting to see how much improvement.  Also recommend he elevate his feet as much as possible to help with the dependent edema.

## 2017-05-31 NOTE — Patient Instructions (Signed)
Also get Lamisil  AF

## 2017-06-02 ENCOUNTER — Encounter: Payer: Self-pay | Admitting: Acute Care

## 2017-06-02 ENCOUNTER — Ambulatory Visit (INDEPENDENT_AMBULATORY_CARE_PROVIDER_SITE_OTHER): Payer: Medicare Other | Admitting: Acute Care

## 2017-06-02 DIAGNOSIS — J441 Chronic obstructive pulmonary disease with (acute) exacerbation: Secondary | ICD-10-CM | POA: Diagnosis not present

## 2017-06-02 DIAGNOSIS — I201 Angina pectoris with documented spasm: Secondary | ICD-10-CM

## 2017-06-02 NOTE — Patient Instructions (Addendum)
It is nice to meet you today. Continue your Symbicort 2 puffs twice daily Remember to rinse mouth after use. Continue your combivent one puff every 6 hours as needed. Continue your Duonebs as you have been doing. Follow up appointment with [name] 10/2017 We will plan a follow up CT scan April 2019 prior to [place]. Note your daily symptoms > remember "red flags" for COPD:  Increase in cough, increase in sputum production, increase in shortness of breath or activity tolerance. If you notice these symptoms, please call to be seen.   Continue on CPAP at bedtime. You appear to be benefiting from the treatment Goal is to wear for at least 6 hours each night for maximal clinical benefit. Continue to work on weight loss, as the link between excess weight  and sleep apnea is well established.  Do not drive if sleepy. Remember to clean mask, tubing, filter, and reservoir once weekly with soapy water.  Follow up with [name] In April or before as needed.  Please contact office for sooner follow up if symptoms do not improve or worsen or seek emergency care .

## 2017-06-02 NOTE — Progress Notes (Signed)
History of Present Illness Curtis Riley is a 82 y.o. male with COPD and chronic respiratory failure with hypoxemia, OSA on CPAP. He is followed by Dr. Lake Riley.  Synopsis Former patient of Dr. Ferrel Riley who has COPD and chronic respiratory failure with hypoxemia.  Also has obstructive sleep apnea on CPAP Dr. Corrie Riley described his situation as follows: Patient has a 22 PY smoking history, quit in his 37s. He was born and raised in [place] and was exposed to coal dust. 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 He worked for [employer] from 1987 for 10 years. 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 He was in the service from 1955 until 1975. He was in [place], [place], [place]. 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   06/02/2017 3 week follow up: Pt presents for follow up. He was seen by Dr. Lake Riley 05/11/2017 for worsening dyspnea and endobronchial lesion in the right lower lobe. Plan per Dr Curtis Riley after that visit was as follows:  Plan: For the pulmonary nodule and right lower lobe endobronchial nodule: As we discussed today in clinic I recommend that you have a repeat CT, if you reconsider your decision on this then please call me back and I will order the test between now and the next visit.  Otherwise we will plan on talking about it in the next visit.  For your COPD: I will give you a sample of Trelegy, take 1 puff a day no matter how you feel Do not take Symbicort while you are taking Trelegy Call me after you have taken the Trelegy for 1-2 weeks and let me know if it is making her feel better, if so then I will call in a new prescription Flu shot today  For 4 physical deconditioning: Try to stay as active as possible Walk 5 minutes daily, if this goes okay then in a week walk 6 minutes daily and saline  Obstructive sleep apnea: Keep taking  CPAP nightly with oxygen  For dyspnea: We will check your oxygen level when you are walking around today  Pt. Presents for follow up. He states he has been using the Trelegy until he ran out of the sample we gave him. At that time he started using the Symbicort he had previously been prescribed. It had been causing a sore throat, which has now resolved and the patient does not feel the Symbicort is the source.The patient states he did not nortice any difference in his dyspnea on the Trelegy, so plans to continue on with the Symbicort.The patient does not want to have another CT or follow up with further diagnostics at this time for the endobronchial lesion noted on the 10/2016 CT Chest..He agreed to have a repeat CT in April 2019 to recheck the endobronchial lesion. He states his breathing is at baseline for him.He is compliant with the Symbicort, Combivent and Duonebs. He states he has been uable to increase his physical activity due to chronic back pain.He states he is not coughing or wheezing at present.He denies fever, chest pain, orthopnea or hemoptysis. Pt. States he is compliant with his CPAP nightly.  Test Results: April 2018 CT chest images independently reviewed showing mild to moderate centrilobular emphysema and multiple scattered pulmonary nodules, they haven't changed since prior;   Pulmonary function test: May 2018 ratio 51%, FEV1  1.36 L 46% predicted, FVC 2.64 L 66% predicted, total lung capacity 8.7 L 116% predicted, residual volume 200% predicted, DLCO 26.05 74% predicted   CBC Latest Ref Rng & Units 11/25/2016 10/08/2014 10/07/2014  WBC 4.0 - 10.5 K/uL 5.7 10.1 11.4(H)  Hemoglobin 13.2 - 17.1 g/dL 12.6(L) 11.6(L) 12.3(L)  Hematocrit 38.5 - 50.0 % 39.2 36.6(L) 38.3(L)  Platelets 140 - 400 K/uL 210 244 240    BMP Latest Ref Rng & Units 12/28/2016 11/25/2016 10/08/2014  Glucose 65 - 99 mg/dL 96 88 115(H)  BUN 7 - 25 mg/dL 15 14 24(H)  Creatinine 0.70 - 1.18 mg/dL 1.55(H) 1.39(H)  1.13  Sodium 135 - 146 mmol/L 139 140 140  Potassium 3.5 - 5.3 mmol/L 4.9 4.4 3.7  Chloride 98 - 110 mmol/L 108 107 106  CO2 20 - 31 mmol/L 17(L) 25 26  Calcium 8.6 - 10.3 mg/dL 8.9 9.2 8.6    BNP    Component Value Date/Time   BNP 114.3 (H) 10/07/2014 0849   BNP 43.6 09/25/2014 1440    PFT    Component Value Date/Time   FEV1PRE 1.31 12/17/2016 1205   FEV1POST 1.36 12/17/2016 1205   FVCPRE 2.55 12/17/2016 1205   FVCPOST 2.64 12/17/2016 1205   TLC 8.67 12/17/2016 1205   DLCOUNC 26.05 12/17/2016 1205   PREFEV1FVCRT 52 12/17/2016 1205   PSTFEV1FVCRT 51 12/17/2016 1205    No results found.   Past medical hx Past Medical History:  Diagnosis Date  . Angina    occasional chest pain  never been treated for this ...   . Arthritis   . BPH (benign prostatic hyperplasia)   . COPD (chronic obstructive pulmonary disease) ([place])   . CVA (cerebral infarction)   . Diverticulosis   . Dyslipidemia   . Family history of coronary artery disease    brothers, sisters  . History of nuclear stress test 03/2010   dobutamine; mild ischemia in mid inferior and apical inferior regions; ekg negative for ischemia; low risk scan   . Hypertension   . Kidney stones    10-15 yrs ago  . Neuromuscular disorder ([place])   . Shortness of breath    with exertion   . Sleep apnea    uses cpap  . Smoker      Social History   Tobacco Use  . Smoking status: Former Smoker    Packs/day: 1.00    Years: 40.00    Pack years: 40.00    Types: Cigarettes    Last attempt to quit: 07/20/1998    Years since quitting: 18.8  . Smokeless tobacco: Never Used  Substance Use Topics  . Alcohol use: No  . Drug use: No    Mr.Curtis Riley reports that he quit smoking about 18 years ago. His smoking use included cigarettes. He has a 40.00 pack-year smoking history. he has never used smokeless tobacco. He reports that he does not drink alcohol or use drugs.  Tobacco Cessation: Former smoker quit 2000  Past surgical  hx, Family hx, Social hx all reviewed.  Current Outpatient Medications on File Prior to Visit  Medication Sig  . aspirin 81 MG tablet Take 81 mg by mouth daily.  . budesonide-formoterol (SYMBICORT) 160-4.5 MCG/ACT inhaler Inhale 2 puffs into the lungs 2 (two) times daily.  . carvedilol (COREG) 25 MG tablet Take 1 tablet (25 mg total) by mouth 2 (two) times daily.  . COMBIVENT RESPIMAT 20-100 MCG/ACT AERS respimat USE 1 INHALATION EVERY 6 HOURS  . Fluticasone-Umeclidin-Vilant (TRELEGY  ELLIPTA) 100-62.5-25 MCG/INH AEPB Inhale 1 puff into the lungs daily.  . furosemide (LASIX) 40 MG tablet Take 1 tablet by mouth twice daily.  Nyoka Cowden Tea, Camillia sinensis, (GREEN TEA PO) Take 1 tablet by mouth daily.  Marland Kitchen ibuprofen (ADVIL,MOTRIN) 200 MG tablet Take by mouth every 6 (six) hours as needed for mild pain.   Marland Kitchen ipratropium-albuterol (DUONEB) 0.5-2.5 (3) MG/3ML SOLN INHALE 1 VIAL VIA NEBULIZER EVERY 4 HOURS AS NEEDED  . lisinopril (PRINIVIL,ZESTRIL) 40 MG tablet Take 1 tablet (40 mg total) by mouth daily.  . nitroGLYCERIN (NITROSTAT) 0.4 MG SL tablet Place 1 tablet (0.4 mg total) under the tongue every 5 (five) minutes as needed for chest pain.  . NON FORMULARY at bedtime. CPAP  . potassium chloride (K-DUR) 10 MEQ tablet Take 1 tablet (10 mEq total) by mouth daily.  Marland Kitchen terbinafine (LAMISIL) 250 MG tablet Take 1 tablet (250 mg total) daily by mouth.   No current facility-administered medications on file prior to visit.      Allergies  Allergen Reactions  . Penicillins Anaphylaxis    Review Of Systems:  Constitutional:   No  weight loss, night sweats,  Fevers, chills, fatigue, or  lassitude.  HEENT:   No headaches,  Difficulty swallowing,  Tooth/dental problems, or  Sore throat,                No sneezing, itching, ear ache, nasal congestion, post nasal drip,   CV:  No chest pain,  Orthopnea, PND, 1+ swelling in lower extremities, anasarca, dizziness, palpitations, syncope.   GI  No heartburn,  indigestion, abdominal pain, nausea, vomiting, diarrhea, change in bowel habits, loss of appetite, bloody stools.   Resp: + shortness of breath with exertion less at rest.  No excess mucus, no productive cough,  No non-productive cough,  No coughing up of blood.  No change in color of mucus.  No wheezing.  No chest wall deformity  Skin: no rash or lesions.  GU: no dysuria, change in color of urine, no urgency or frequency.  No flank pain, no hematuria   MS:  No joint pain or swelling.  No decreased range of motion.  No back pain.  Psych:  No change in mood or affect. No depression or anxiety.  No memory loss.   Vital Signs BP 128/72 (BP Location: Left Arm, Cuff Size: Normal)   Pulse 75   Ht 6\' 4"  (1.93 m)   Wt 252 lb 6.4 oz (114.5 kg)   SpO2 97%   BMI 30.72 kg/m    Physical Exam:  General- No distress,  A&Ox3, pleasant ENT: No sinus tenderness, TM clear, pale nasal mucosa, no oral exudate,no post nasal drip, no LAN Cardiac: S1, S2, regular rate and rhythm, no murmur Chest: No wheeze/ rales/ dullness; no accessory muscle use, no nasal flaring, no sternal retractions Abd.: Soft Non-tender, non-distended, obese Ext: No clubbing cyanosis, 1 + BLE dema Neuro:  Deconditioned at baseline, MAE x 4, cranial nerves intact Skin: No rashes, warm and dry Psych: normal mood and behavior   Assessment/Plan  Chronic obstructive pulmonary disease (HCC) Felt Trelegy did not help as much as Symbicort Plan: Continue your Symbicort 2 puffs twice daily Remember to rinse mouth after use. Continue your combivent one puff every 6 hours as needed. Continue your Duonebs as you have been doing. Follow up appointment with Dr. Lake Riley 10/2017 We will plan a follow up CT scan April 2019 prior to [place]. Note your daily symptoms > remember "  red flags" for COPD:  Increase in cough, increase in sputum production, increase in shortness of breath or activity tolerance. If you notice these symptoms, please call  to be seen.   Continue on CPAP at bedtime. You appear to be benefiting from the treatment Goal is to wear for at least 6 hours each night for maximal clinical benefit. Continue to work on weight loss, as the link between excess weight  and sleep apnea is well established.  Do not drive if sleepy. Remember to clean mask, tubing, filter, and reservoir once weekly with soapy water.  Follow up with Dr. Lake Riley In April or before as needed.  Please contact office for sooner follow up if symptoms do not improve or worsen or seek emergency care .     Magdalen Spatz, NP 06/02/2017  7:37 PM

## 2017-06-02 NOTE — Assessment & Plan Note (Addendum)
Felt Trelegy did not help as much as Symbicort Plan: Continue your Symbicort 2 puffs twice daily Remember to rinse mouth after use. Continue your combivent one puff every 6 hours as needed. Continue your Duonebs as you have been doing. Follow up appointment with [name] 10/2017 We will plan a follow up CT scan April 2019 prior to [place]. Note your daily symptoms > remember "red flags" for COPD:  Increase in cough, increase in sputum production, increase in shortness of breath or activity tolerance. If you notice these symptoms, please call to be seen.   Continue on CPAP at bedtime. You appear to be benefiting from the treatment Goal is to wear for at least 6 hours each night for maximal clinical benefit. Continue to work on weight loss, as the link between excess weight  and sleep apnea is well established.  Do not drive if sleepy. Remember to clean mask, tubing, filter, and reservoir once weekly with soapy water.  Follow up with [name] In April or before as needed.  Please contact office for sooner follow up if symptoms do not improve or worsen or seek emergency care .

## 2017-06-03 NOTE — Progress Notes (Signed)
Reviewed, agree 

## 2017-06-16 ENCOUNTER — Other Ambulatory Visit: Payer: Self-pay | Admitting: Internal Medicine

## 2017-06-21 ENCOUNTER — Ambulatory Visit (INDEPENDENT_AMBULATORY_CARE_PROVIDER_SITE_OTHER): Payer: Medicare Other | Admitting: Family Medicine

## 2017-06-21 ENCOUNTER — Encounter: Payer: Self-pay | Admitting: Family Medicine

## 2017-06-21 VITALS — BP 130/70 | HR 62 | Resp 16 | Wt 245.2 lb

## 2017-06-21 DIAGNOSIS — B353 Tinea pedis: Secondary | ICD-10-CM | POA: Diagnosis not present

## 2017-06-21 DIAGNOSIS — I201 Angina pectoris with documented spasm: Secondary | ICD-10-CM

## 2017-06-21 DIAGNOSIS — B351 Tinea unguium: Secondary | ICD-10-CM

## 2017-06-21 NOTE — Progress Notes (Signed)
   Subjective:    Patient ID: Curtis Riley, male    DOB: 1935/01/22, 82 y.o.   MRN: [id number]  HPI He is here for a recheck.  He has been on Lamisil and is also been using topical preparations on his feet and notes no real improvement in the macerated areas between his toes.   Review of Systems     Objective:   Physical Exam Alert and in no distress.  Thickening of the nails is noted bilaterally.  Still has maceration between the toes especially on the left foot.       Assessment & Plan:  Tinea pedis of both feet - Plan: Ambulatory referral to Podiatry  Onychomycosis - Plan: Ambulatory referral to Podiatry Since he has not really responded to Lamisil, I will refer to podiatry for further care of this.

## 2017-06-23 ENCOUNTER — Encounter: Payer: Self-pay | Admitting: Podiatry

## 2017-06-23 ENCOUNTER — Ambulatory Visit (INDEPENDENT_AMBULATORY_CARE_PROVIDER_SITE_OTHER): Payer: Medicare Other | Admitting: Podiatry

## 2017-06-23 VITALS — BP 132/82 | HR 68 | Resp 16

## 2017-06-23 DIAGNOSIS — M21619 Bunion of unspecified foot: Secondary | ICD-10-CM

## 2017-06-23 DIAGNOSIS — I201 Angina pectoris with documented spasm: Secondary | ICD-10-CM

## 2017-06-23 DIAGNOSIS — L309 Dermatitis, unspecified: Secondary | ICD-10-CM | POA: Diagnosis not present

## 2017-06-23 MED ORDER — METHYLPREDNISOLONE 4 MG PO TBPK: ORAL_TABLET | ORAL | 0 refills | Status: DC

## 2017-06-23 NOTE — Progress Notes (Signed)
   Subjective:    Patient ID: Curtis Riley, male    DOB: Feb 28, 1935, 82 y.o.   MRN: [id number]  HPI    Review of Systems  All other systems reviewed and are negative.      Objective:   Physical Exam        Assessment & Plan:

## 2017-06-23 NOTE — Progress Notes (Signed)
Subjective:   Patient ID: Curtis Riley, male   DOB: 82 y.o.   MRN: [id number]   HPI Patient presents with irritation of the dorsal left foot and has just started oral Lamisil from family physician and states that it still itching and red quite a bit   Review of Systems  All other systems reviewed and are negative.       Objective:  Physical Exam  Constitutional: He appears well-developed and well-nourished.  Cardiovascular: Intact distal pulses.  Pulmonary/Chest: Effort normal.  Musculoskeletal: Normal range of motion.  Neurological: He is alert.  Skin: Skin is warm.  Nursing note and vitals reviewed.   Neurovascular status was found to be intact muscle strength was adequate range of motion within normal limits.  Patient is found to have irritation of the dorsum of the left foot in the midfoot region and into the digits with no indications of drainage or breakdown of skin.  There is quite a bit of inflammation associated with it and patient was noted not to smoke and likes to be active     Assessment:  Appears to be fungal infection with overlying inflammatory reaction with itching and irritation     Plan:  H&P condition reviewed and discussed at great length.  At this point I have recommended that we go ahead and utilize a 6-day steroid pack in between the Lamisil to try to reduce inflammation followed by 30 more days of Lamisil.  Patient will be seen back for [place] to recheck

## 2017-07-19 DIAGNOSIS — G709 Myoneural disorder, unspecified: Secondary | ICD-10-CM | POA: Insufficient documentation

## 2017-07-19 DIAGNOSIS — K5790 Diverticulosis of intestine, part unspecified, without perforation or abscess without bleeding: Secondary | ICD-10-CM | POA: Insufficient documentation

## 2017-07-19 DIAGNOSIS — F17200 Nicotine dependence, unspecified, uncomplicated: Secondary | ICD-10-CM | POA: Insufficient documentation

## 2017-07-19 DIAGNOSIS — N200 Calculus of kidney: Secondary | ICD-10-CM | POA: Insufficient documentation

## 2017-07-19 DIAGNOSIS — N400 Enlarged prostate without lower urinary tract symptoms: Secondary | ICD-10-CM | POA: Insufficient documentation

## 2017-07-19 DIAGNOSIS — J449 Chronic obstructive pulmonary disease, unspecified: Secondary | ICD-10-CM | POA: Insufficient documentation

## 2017-07-19 DIAGNOSIS — I10 Essential (primary) hypertension: Secondary | ICD-10-CM | POA: Insufficient documentation

## 2017-07-19 DIAGNOSIS — E785 Hyperlipidemia, unspecified: Secondary | ICD-10-CM | POA: Insufficient documentation

## 2017-07-19 DIAGNOSIS — G4730 Sleep apnea, unspecified: Secondary | ICD-10-CM | POA: Insufficient documentation

## 2017-07-19 DIAGNOSIS — R0602 Shortness of breath: Secondary | ICD-10-CM | POA: Insufficient documentation

## 2017-07-19 DIAGNOSIS — Z8249 Family history of ischemic heart disease and other diseases of the circulatory system: Secondary | ICD-10-CM | POA: Insufficient documentation

## 2017-08-02 ENCOUNTER — Ambulatory Visit (INDEPENDENT_AMBULATORY_CARE_PROVIDER_SITE_OTHER): Payer: Medicare Other | Admitting: Internal Medicine

## 2017-08-02 ENCOUNTER — Encounter: Payer: Self-pay | Admitting: Internal Medicine

## 2017-08-02 VITALS — BP 118/58 | HR 71 | Ht 75.5 in | Wt 252.2 lb

## 2017-08-02 DIAGNOSIS — I10 Essential (primary) hypertension: Secondary | ICD-10-CM | POA: Diagnosis not present

## 2017-08-02 DIAGNOSIS — Z9989 Dependence on other enabling machines and devices: Secondary | ICD-10-CM | POA: Diagnosis not present

## 2017-08-02 DIAGNOSIS — G4733 Obstructive sleep apnea (adult) (pediatric): Secondary | ICD-10-CM

## 2017-08-02 DIAGNOSIS — J449 Chronic obstructive pulmonary disease, unspecified: Secondary | ICD-10-CM

## 2017-08-02 DIAGNOSIS — R0600 Dyspnea, unspecified: Secondary | ICD-10-CM | POA: Diagnosis not present

## 2017-08-02 NOTE — Progress Notes (Signed)
OFFICE NOTE  Chief Complaint:  Breathing is stable  Primary Care Physician: Denita Lung, MD  HPI:  Curtis Riley is a 83 year old gentleman with a history of cervical neuropathy and recent C3-C4 surgery. He also has dyslipidemia, hypertension, sleep apnea, on CPAP, and mild COPD. He underwent catheterization, as you know, in 2012 for chest pain. It was negative except for a small bridging segment in the LAD. When I last saw him he had 2 episodes of chest discomfort and I prescribed some nitroglycerin. He reports that he has not needed to take that in the past 6 months. He has had some increase in mucus and secretions related to his COPD and shortness of breath. He feels that that could be better controlled and is planning to see his primary care provider about that.   This [name] returns today for followup. He is generally doing well. He denies any chest pain. He has had progressive shortness of breath and recently has been approved for oxygen. He plans to wear that 24/7. He does struggle with some lower extremity edema and wears compression stockings.  I saw Curtis Riley back in the office today. He reports that he's had some progressive shortness of breath again, despite using oxygen and CPAP. He's had some worsening leg swelling, more than typical in the past. He wears compression stockings, however has also noted some weight gain and mild orthopnea. I'm concerned that there may be some overlying right heart failure.  I saw Curtis Riley back in the office today. He reports an improvement in his breathing. There was some signs of mild congestive heart failure, possibly right heart failure. Increase his diuretics seem to have helped although he developed a pneumonia and was hospitalized. Subsequently was discharged on antibiotics which he's completed and a nebulizer. He was ordered have an echocardiogram however the LV was not well visualized due to his COPD, therefore no EF was  provided. I recommended a repeat limited echocardiogram with contrast however that was not yet obtained.   Curtis Riley returns today for follow-up. He reports no significant changes in his shortness of breath. He recently was admitted with a pneumonia and is recovered from that. Aced occasionally gets short-lived atypical chest pains. He did have chronic catheterization 2012 which showed no obstructive coronary disease.  12/06/2015  Curtis Riley returns for follow-up. He reports shortness of breath with minimal exertion however relates this to COPD. In fact she's lost about 6 pounds. There is no evidence that he's had any worsening diastolic heart failure. He continues on Lasix 40 mg daily. He's also noted some recent hearing loss in the right ear. Blood pressure is at goal today.  09/24/2016  Curtis Riley was again seen today in follow-up. Overall he continues to have shortness of breath with minimal exertion but he feels this is related to COPD. He does continue to wear oxygen at night. He tells me today that he does have a history of his spasticity exposure in the past and wonders if he could have lung disease related to this or whether it was related to smoking. I suspect this both. He does want to get screened to make sure he doesn't have any lung cancer such as mesothelioma or other findings. He is interested in a pulmonary referral which will place today per his request. He reports recently he's had more swelling than usual. He has been taking 80 mg of Lasix alternating with 40 mg of Lasix. He asked to be  easier to take 60 mg daily.  03/23/2017  Curtis Riley returns a for follow-up. He reports she's having some new left chest pain and left arm aching. These seem to be new symptoms over the past 6 months. Is not necessarily noting that worse with exertion or being relieved by rest. There were reports some stable dyspnea. He was put on another inhaler by his pulmonologist but he says is not necessarily  helped or improved his breathing. He mostly notes the chest discomfort at night. He says he wakes up in the middle the night with chest discomfort. He does wear CPAP and has been compliant with this. He had left heart catheterization in 10/2010 by Dr. Rex Kras which showed no occlusive coronary artery disease, but has not had ischemia testing since then. Unfortunately has low back pain and walks with a cane. He'll be unable to exercise on a treadmill.  04/29/2017  Curtis Riley was seen today in follow-up. He underwent nuclear stress testing for chest pain which she says is improved however he's had worsening shortness of breath. He's also had worsening lower extremity edema. Stress test was negative for ischemia however showed an EF of 49%. I suspect this is a gating abnormality. His shortness of breath may be worsening pulmonary disease. He was previously placed on Symbicort but discontinued it. He's using a Combivent inhaler but very frequently. He has not returned to see pulmonary and his pulmonologist has since left the practice. I suspect the EF may be higher than the gated EF on his nuclear stress test. He may benefit from some more Lasix and we discussed increasing that today.   08/02/2017  Curtis Riley reports his breathing has been stable. He saw his pulmonologist in the Fall - they recommended a new inhaler, but he feels stable on Symbicort. The increase in his diuretics have helped his edema, but not his breathing. He denies chest pain and had a low risk myoview in 03/2017.  PMHx:  Past Medical History:  Diagnosis Date  . Angina    occasional chest pain  never been treated for this ...   . Arthritis   . BPH (benign prostatic hyperplasia)   . COPD (chronic obstructive pulmonary disease) ([place])   . CVA (cerebral infarction)   . Diverticulosis   . Dyslipidemia   . Family history of coronary artery disease    brothers, sisters  . History of nuclear stress test 03/2010   dobutamine; mild  ischemia in mid inferior and apical inferior regions; ekg negative for ischemia; low risk scan   . Hypertension   . Kidney stones    10-15 yrs ago  . Neuromuscular disorder ([place])   . Shortness of breath    with exertion   . Sleep apnea    uses cpap  . Smoker     Past Surgical History:  Procedure Laterality Date  . ANTERIOR CERVICAL DECOMP/DISCECTOMY FUSION  10/06/2011   Procedure: ANTERIOR CERVICAL DECOMPRESSION/DISCECTOMY FUSION 3 LEVELS;  Surgeon: Faythe Ghee, MD;  Location: [facility];  Service: Neurosurgery;  Laterality: Bilateral;  Cervical three-four, four-five, five-six anterior cervical decompression, fusion  . CARDIAC CATHETERIZATION  10/2010   small bridging segment in LAD  . TONSILLECTOMY  age 58  . TRANSTHORACIC ECHOCARDIOGRAM  2004   normal LV size and function, aortic root dilated  . TRANSURETHRAL RESECTION OF PROSTATE  08/1996  . TRANSURETHRAL RESECTION OF PROSTATE     long time ago per pt.  10-15 yrs    FAMHx:  Family History  Problem Relation Age of Onset  . Arthritis Mother   . Hypertension Mother   . Arthritis Father   . Hypertension Father   . Arthritis Sister   . Diabetes Sister   . Hypertension Sister   . Arthritis Brother   . Hypertension Brother     SOCHx:   reports that he quit smoking about 19 years ago. His smoking use included cigarettes. He has a 40.00 pack-year smoking history. he has never used smokeless tobacco. He reports that he does not drink alcohol or use drugs.  ALLERGIES:  Allergies  Allergen Reactions  . Penicillins Anaphylaxis    ROS: Pertinent items noted in HPI and remainder of comprehensive ROS otherwise negative.  HOME MEDS: Current Outpatient Medications  Medication Sig Dispense Refill  . aspirin 81 MG tablet Take 81 mg by mouth daily.    . budesonide-formoterol (SYMBICORT) 160-4.5 MCG/ACT inhaler Inhale 2 puffs into the lungs 2 (two) times daily. 3 Inhaler 3  . carvedilol (COREG) 25 MG tablet TAKE 1 TABLET  TWICE A DAY 180 tablet 0  . COMBIVENT RESPIMAT 20-100 MCG/ACT AERS respimat USE 1 INHALATION EVERY 6 HOURS 12 g 11  . Fluticasone-Umeclidin-Vilant (TRELEGY ELLIPTA) 100-62.5-25 MCG/INH AEPB Inhale 1 puff into the lungs daily. 1 each 0  . furosemide (LASIX) 40 MG tablet Take 1 tablet by mouth twice daily. 180 tablet 3  . Green Tea, Camillia sinensis, (GREEN TEA PO) Take 1 tablet by mouth daily.    Marland Kitchen ibuprofen (ADVIL,MOTRIN) 200 MG tablet Take by mouth every 6 (six) hours as needed for mild pain.     Marland Kitchen ipratropium-albuterol (DUONEB) 0.5-2.5 (3) MG/3ML SOLN INHALE 1 VIAL VIA NEBULIZER EVERY 4 HOURS AS NEEDED 1080 mL 3  . lisinopril (PRINIVIL,ZESTRIL) 40 MG tablet Take 1 tablet (40 mg total) by mouth daily. 90 tablet 3  . methylPREDNISolone (MEDROL DOSEPAK) 4 MG TBPK tablet follow package directions 21 tablet 0  . nitroGLYCERIN (NITROSTAT) 0.4 MG SL tablet Place 1 tablet (0.4 mg total) under the tongue every 5 (five) minutes as needed for chest pain. 25 tablet 3  . NON FORMULARY at bedtime. CPAP    . OXYGEN Inhale into the lungs. Uses Oxygen at night with CPAP    . potassium chloride (K-DUR) 10 MEQ tablet Take 1 tablet (10 mEq total) by mouth daily. 90 tablet 3  . terbinafine (LAMISIL) 250 MG tablet Take 1 tablet (250 mg total) daily by mouth. 30 tablet 1   No current facility-administered medications for this visit.     LABS/IMAGING: No results found for this or any previous visit (from the past 48 hour(s)). No results found.  VITALS: BP (!) 118/58   Pulse 71   Ht 6' 3.5" (1.918 m)   Wt 252 lb 3.2 oz (114.4 kg)   BMI 31.11 kg/m   EXAM: General appearance: alert and no distress Lungs: diminished breath sounds bilaterally Heart: regular rate and rhythm, S1, S2 normal, no murmur, click, rub or gallop Extremities: extremities normal, atraumatic, no cyanosis or edema Neurologic: Grossly normal  EKG: Normal sinus rhythm 71-personally reviewed  ASSESSMENT: 1. Left chest and arm pain-no  significant obstructive coronary disease by cath 2012 - low risk myoview, EF 49% (03/2017) 2. Hypertension-controlled 3. Dyslipidemia 4. COPD - now on oxygen 5. OSA on CPAP 6. History of asbestos exposure  PLAN: 1.   Mr. Belshe reports stable dyspnea.  His pulmonologist wish to adjust his medications but he feels that he is doing fairly well  on the Symbicort.  He is leery of trying new medications.  He had a low risk Myoview recently.  An increase in his Lasix has not improved his symptoms although his swelling is better.  Blood pressure is at goal today.  Follow-up with me 6 months.  Pixie Casino, MD, [facility], [place] Director of the [facility] Lipid [facility] &  Cardiovascular [facility] Reduction [facility] Diplomate of the American Board of Clinical Lipidology Attending Cardiologist  Direct Dial: [phone]  Fax: [phone]  Website:  [email or web address] 08/02/2017, 1:44 PM

## 2017-08-02 NOTE — Patient Instructions (Signed)
Your physician wants you to follow-up in: 6 months with Dr. Debara Pickett. You will receive a reminder letter in the mail two months in advance. If you don't receive a letter, please call our office to schedule the follow-up appointment.  Thank you for choosing [facility] at [facility]!!

## 2017-09-24 ENCOUNTER — Other Ambulatory Visit: Payer: Self-pay | Admitting: Internal Medicine

## 2017-10-01 ENCOUNTER — Telehealth: Payer: Self-pay | Admitting: Pulmonary Disease

## 2017-10-01 DIAGNOSIS — J439 Emphysema, unspecified: Secondary | ICD-10-CM

## 2017-10-01 NOTE — Telephone Encounter (Signed)
Pt is calling back  [phone]

## 2017-10-01 NOTE — Telephone Encounter (Signed)
Routing message to [facility] Pool to review.  Pt needs CT chest scheduled prior to BQ appt in 11/02/2017

## 2017-10-01 NOTE — Telephone Encounter (Signed)
CT scheduled for 4/9 at 11:30 at [facility].  Spoke to pt & gave him appt info.  Nothing further needed.

## 2017-10-01 NOTE — Telephone Encounter (Signed)
Pt is former [name] pt; now with BQ Pt has appt with BQ on 11-03-2014 at 12pm  Pt needs CT chest w/o contrast prior to appt Placed CT chest today to [facility] Will follow up

## 2017-10-12 ENCOUNTER — Other Ambulatory Visit: Payer: Self-pay | Admitting: Internal Medicine

## 2017-10-12 ENCOUNTER — Other Ambulatory Visit: Payer: Self-pay | Admitting: Family Medicine

## 2017-10-12 NOTE — Telephone Encounter (Signed)
REFILL 

## 2017-10-15 ENCOUNTER — Ambulatory Visit (INDEPENDENT_AMBULATORY_CARE_PROVIDER_SITE_OTHER): Payer: Medicare Other | Admitting: Podiatry

## 2017-10-15 ENCOUNTER — Encounter: Payer: Self-pay | Admitting: Podiatry

## 2017-10-15 DIAGNOSIS — L309 Dermatitis, unspecified: Secondary | ICD-10-CM

## 2017-10-15 DIAGNOSIS — B351 Tinea unguium: Secondary | ICD-10-CM | POA: Diagnosis not present

## 2017-10-15 LAB — HEPATIC FUNCTION PANEL
AG Ratio: 1.1 (calc) (ref 1.0–2.5)
ALKALINE PHOSPHATASE (APISO): 66 U/L (ref 40–115)
ALT: 14 U/L (ref 9–46)
AST: 18 U/L (ref 10–35)
Albumin: 3.5 g/dL — ABNORMAL LOW (ref 3.6–5.1)
BILIRUBIN INDIRECT: 0.7 mg/dL (ref 0.2–1.2)
Bilirubin, Direct: 0.2 mg/dL (ref 0.0–0.2)
GLOBULIN: 3.1 g/dL (ref 1.9–3.7)
TOTAL PROTEIN: 6.6 g/dL (ref 6.1–8.1)
Total Bilirubin: 0.9 mg/dL (ref 0.2–1.2)

## 2017-10-15 MED ORDER — TERBINAFINE HCL 250 MG PO TABS: 250.0000 mg | ORAL_TABLET | Freq: Every day | ORAL | 0 refills | Status: DC

## 2017-10-15 NOTE — Progress Notes (Signed)
Subjective:   Patient ID: Curtis Riley, male   DOB: 83 y.o.   MRN: [id number]   HPI Patient states a couple weeks ago his athlete's foot came back and he has a rash on top of both feet that is not sure that   ROS      Objective:  Physical Exam  Neurovascular status intact with patient found to have regarding tissue between digits 2 3 and 4 of both feet with dorsal rash noted     Assessment:  Appears to be fungal infection with possibility rash involved with fungal infection or other pathology     Plan:  We will start him on oral medication Lamisil for 60 days as it did improve with 30 days and I did order liver function studies.  I explained that the rash persist I want him to see the dermatologist

## 2017-10-26 ENCOUNTER — Ambulatory Visit (INDEPENDENT_AMBULATORY_CARE_PROVIDER_SITE_OTHER)
Admission: RE | Admit: 2017-10-26 | Discharge: 2017-10-26 | Disposition: A | Discharge status: Home / Self Care | Payer: Medicare Other | Source: Ambulatory Visit | Attending: Pulmonary Disease | Admitting: Pulmonary Disease

## 2017-10-26 DIAGNOSIS — R918 Other nonspecific abnormal finding of lung field: Secondary | ICD-10-CM | POA: Diagnosis not present

## 2017-10-26 DIAGNOSIS — J439 Emphysema, unspecified: Secondary | ICD-10-CM

## 2017-11-02 ENCOUNTER — Encounter: Payer: Self-pay | Admitting: Pulmonary Disease

## 2017-11-02 ENCOUNTER — Ambulatory Visit (INDEPENDENT_AMBULATORY_CARE_PROVIDER_SITE_OTHER): Payer: Medicare Other | Admitting: Pulmonary Disease

## 2017-11-02 VITALS — BP 116/66 | HR 75 | Ht 76.0 in | Wt 249.0 lb

## 2017-11-02 DIAGNOSIS — J441 Chronic obstructive pulmonary disease with (acute) exacerbation: Secondary | ICD-10-CM

## 2017-11-02 DIAGNOSIS — J439 Emphysema, unspecified: Secondary | ICD-10-CM

## 2017-11-02 DIAGNOSIS — G4733 Obstructive sleep apnea (adult) (pediatric): Secondary | ICD-10-CM | POA: Diagnosis not present

## 2017-11-02 DIAGNOSIS — Z9989 Dependence on other enabling machines and devices: Secondary | ICD-10-CM

## 2017-11-02 DIAGNOSIS — R911 Solitary pulmonary nodule: Secondary | ICD-10-CM

## 2017-11-02 NOTE — Patient Instructions (Signed)
Obstructive sleep apnea: Keep using CPAP nightly Call us if you need a medical order for the CPAP cleaning device  COPD/emphysema: Continue Symbicort 2 puffs daily no matter how you feel  Pulmonary nodule: If you change your mind about having a bronchoscopy please let me know Otherwise, we will hold off on further imaging  We will plan on seeing you back in 1 year or sooner if needed

## 2017-11-02 NOTE — Progress Notes (Signed)
Subjective:    Patient ID: Curtis Riley, male    DOB: [birth year], 83 y.o.   MRN: [id number]  Former patient of [name] who has COPD and chronic respiratory failure with hypoxemia.  Also has obstructive sleep apnea on CPAP [name] described his situation as follows: Patient has a 31 PY smoking history, quit in his 60s.  He was born and raised in [place] and was exposed to coal dust.  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  He worked for [employer] from 1987 for 10 years.  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 He was in the service from 1955 until 1975. He was in [place], [place], [place]. 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   HPI Chief Complaint  Patient presents with  . Follow-up    review CT chest.  pt c/o stable DOE.     Curtis Riley says that in general he has been doing well.  He continues to have some aches and pains but these have not worsened recently.  His breathing has been stable.  He continues to take Symbicort 2 puffs twice a day no matter how he feels.  He is using albuterol on an as-needed basis.  He struggled recently with some chest congestio and sinus congestion after using his CPAP machine.  He says he has been struggling with keeping the CPAP machine clean.   Past Medical History:  Diagnosis Date  . Angina    occasional chest pain  never been treated for this ...   . Arthritis   . BPH (benign prostatic hyperplasia)   . COPD (chronic obstructive pulmonary disease) ([place])   . CVA (cerebral infarction)   . Diverticulosis   . Dyslipidemia   . Family history of coronary artery disease    brothers, sisters  . History of nuclear stress test 03/2010   dobutamine; mild ischemia in mid inferior and apical inferior regions; ekg negative for ischemia; low risk scan   . Hypertension   . Kidney stones    10-15 yrs ago  .  Neuromuscular disorder ([place])   . Shortness of breath    with exertion   . Sleep apnea    uses cpap  . Smoker        Review of Systems  Constitutional: Negative for fever and unexpected weight change.  HENT: Negative for congestion, dental problem, ear pain, nosebleeds, postnasal drip, rhinorrhea, sinus pressure, sneezing, sore throat and trouble swallowing.   Eyes: Negative for redness and itching.  Respiratory: Positive for shortness of breath. Negative for cough, chest tightness and wheezing.   Cardiovascular: Negative for palpitations and leg swelling.  Gastrointestinal: Negative for nausea and vomiting.  Genitourinary: Negative for dysuria.  Musculoskeletal: Negative for joint swelling.  Skin: Negative for rash.  Neurological: Negative for headaches.  Hematological: Does not bruise/bleed easily.  Psychiatric/Behavioral: Negative for dysphoric mood. The patient is not nervous/anxious.        Objective:   Physical Exam  Vitals:   11/02/17 1159  BP: 116/66  Pulse: 75  SpO2: 98%  Weight: 249 lb (112.9 kg)  Height: 6\' 4"  (1.93 m)    Gen: well appearing HENT: OP clear, TM's clear, neck supple PULM: CTA B, normal percussion CV: RRR, no mgr, trace edema GI: BS+, soft, nontender Derm: no cyanosis or rash Psyche:  normal mood and affect   Chest imaging: April 2018 CT chest images independently reviewed showing mild to moderate centrilobular emphysema and multiple scattered pulmonary nodules, they haven't changed since prior;  April 2019 CT chest images independently reviewed showing stable pulmonary nodules, scattered centrilobular emphysema, small right lower lobe intrabronchial nodule still seen.  Pulmonary function test: May 2018 ratio 51%, FEV1 1.36 L 46% predicted, FVC 2.64 L 66% predicted, total lung capacity 8.7 L 116% predicted, residual volume 200% predicted, DLCO 26.05 74% predicted      Assessment & Plan:   Pulmonary emphysema, unspecified emphysema type  (HCC)  Chronic obstructive pulmonary disease with acute exacerbation (HCC)  Solitary pulmonary nodule  OSA on CPAP  Discussion: Curtis Riley has been stable recently.  We reviewed the images from his CT chest today and once again the lesion in the right lower lobe was seen.  I discussed my concern with him about this again but he continues to refuse a bronchoscopy to evaluate.  He understands that I am concerned about the possibility of malignancy but he says that he would rather not undergo any invasive procedures or other procedures.  He is struggling with keeping his CPAP clean.  He would like to investigate using a new machine that cleans the materials.  Plan: Obstructive sleep apnea: Keep using CPAP nightly Call us if you need a medical order for the CPAP cleaning device  COPD/emphysema: Continue Symbicort 2 puffs daily no matter how you feel  Pulmonary nodule: If you change your mind about having a bronchoscopy please let me know Otherwise, we will hold off on further imaging  We will plan on seeing you back in 1 year or sooner if needed   Current Outpatient Medications:  .  aspirin 81 MG tablet, Take 81 mg by mouth daily., Disp: , Rfl:  .  carvedilol (COREG) 25 MG tablet, TAKE 1 TABLET TWICE A DAY, Disp: 180 tablet, Rfl: 0 .  COMBIVENT RESPIMAT 20-100 MCG/ACT AERS respimat, USE 1 INHALATION EVERY 6 HOURS, Disp: 12 g, Rfl: 11 .  furosemide (LASIX) 40 MG tablet, TAKE ONE AND ONE-HALF TABLETS DAILY, Disp: 135 tablet, Rfl: 1 .  Green Tea, Camillia sinensis, (GREEN TEA PO), Take 1 tablet by mouth daily., Disp: , Rfl:  .  ibuprofen (ADVIL,MOTRIN) 200 MG tablet, Take by mouth every 6 (six) hours as needed for mild pain. , Disp: , Rfl:  .  ipratropium-albuterol (DUONEB) 0.5-2.5 (3) MG/3ML SOLN, INHALE 1 VIAL VIA NEBULIZER EVERY 4 HOURS AS NEEDED, Disp: 1080 mL, Rfl: 3 .  KLOR-CON M10 10 MEQ tablet, TAKE 1 TABLET DAILY, Disp: 90 tablet, Rfl: 3 .  lisinopril (PRINIVIL,ZESTRIL) 40 MG  tablet, Take 1 tablet (40 mg total) by mouth daily., Disp: 90 tablet, Rfl: 3 .  nitroGLYCERIN (NITROSTAT) 0.4 MG SL tablet, Place 1 tablet (0.4 mg total) under the tongue every 5 (five) minutes as needed for chest pain., Disp: 25 tablet, Rfl: 3 .  NON FORMULARY, at bedtime. CPAP, Disp: , Rfl:  .  OXYGEN, Inhale into the lungs. Uses Oxygen at night with CPAP, Disp: , Rfl:  .  potassium chloride (K-DUR) 10 MEQ tablet, Take 1 tablet (10 mEq total) by mouth daily., Disp: 90 tablet, Rfl: 3 .  terbinafine (LAMISIL) 250 MG tablet, Take 1 tablet (250 mg total) daily by mouth., Disp: 30 tablet, Rfl: 1

## 2017-12-03 ENCOUNTER — Other Ambulatory Visit: Payer: Self-pay | Admitting: Internal Medicine

## 2017-12-06 NOTE — Telephone Encounter (Signed)
Rx has been sent to the pharmacy electronically. ° °

## 2017-12-09 ENCOUNTER — Other Ambulatory Visit: Payer: Self-pay | Admitting: Family Medicine

## 2017-12-15 ENCOUNTER — Ambulatory Visit
Admission: RE | Admit: 2017-12-15 | Discharge: 2017-12-15 | Disposition: A | Discharge status: Home / Self Care | Payer: Medicare Other | Source: Ambulatory Visit | Attending: Family Medicine | Admitting: Family Medicine

## 2017-12-15 ENCOUNTER — Encounter: Payer: Self-pay | Admitting: Family Medicine

## 2017-12-15 ENCOUNTER — Ambulatory Visit (INDEPENDENT_AMBULATORY_CARE_PROVIDER_SITE_OTHER): Payer: Medicare Other | Admitting: Family Medicine

## 2017-12-15 VITALS — BP 120/78 | HR 68 | Temp 98.1°F | Wt 257.8 lb

## 2017-12-15 DIAGNOSIS — M545 Low back pain: Secondary | ICD-10-CM

## 2017-12-15 DIAGNOSIS — M5116 Intervertebral disc disorders with radiculopathy, lumbar region: Secondary | ICD-10-CM | POA: Diagnosis not present

## 2017-12-15 DIAGNOSIS — G8929 Other chronic pain: Secondary | ICD-10-CM | POA: Diagnosis not present

## 2017-12-15 NOTE — Progress Notes (Signed)
   Subjective:    Patient ID: Curtis Riley, male    DOB: 29-Aug-1934, 83 y.o.   MRN: [id number]  HPI He is here for evaluation of low back pain.  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  He was given a 10% disability because of DJD.  He has had intermittent difficulty since then and in fact has been given physical therapy exercises that he has been slightly negligent in doing.  In the last several months he has noted increasing difficulty with low back pain worse with standing and more comfortable when he is sitting.  He does complain of radiation down both thighs but no numbness, tingling or weakness.  No bowel or bladder problems.   Review of Systems     Objective:   Physical Exam Alert and in no distress.  Slight splinting noted when he got out of the chair.  Slight flattening of the lumbar curve but normal motion.  Hip motion is normal.  Negative straight leg raising.  DTRs normal.  Normal strength.       Assessment & Plan:  Chronic bilateral low back pain without sciatica - Plan: DG Lumbar Spine Complete I explained that this is probably normal arthritic changes.  We will get the x-rays and I strongly encouraged him to get back into a more regular back rehab program.  He will keep me in touch concerning this.

## 2017-12-20 ENCOUNTER — Other Ambulatory Visit: Payer: Self-pay

## 2017-12-20 ENCOUNTER — Telehealth: Payer: Self-pay | Admitting: Family Medicine

## 2017-12-20 DIAGNOSIS — M545 Low back pain, unspecified: Secondary | ICD-10-CM

## 2017-12-20 DIAGNOSIS — G8929 Other chronic pain: Secondary | ICD-10-CM

## 2017-12-20 NOTE — Telephone Encounter (Signed)
Referral was put in and pt is aware of results [facility]

## 2017-12-20 NOTE — Telephone Encounter (Signed)
Pt returned call to [facility]. He also said he received Kim's message but he has questions.

## 2017-12-21 ENCOUNTER — Other Ambulatory Visit: Payer: Self-pay | Admitting: Internal Medicine

## 2017-12-22 ENCOUNTER — Other Ambulatory Visit: Payer: Self-pay | Admitting: Family Medicine

## 2017-12-23 NOTE — Telephone Encounter (Signed)
[facility] is requesting to fill combivent. Please advise. [place]

## 2017-12-29 ENCOUNTER — Ambulatory Visit: Payer: Medicare Other | Attending: Family Medicine | Admitting: Physical Therapy

## 2017-12-29 ENCOUNTER — Other Ambulatory Visit: Payer: Self-pay

## 2017-12-29 ENCOUNTER — Encounter: Payer: Self-pay | Admitting: Physical Therapy

## 2017-12-29 DIAGNOSIS — M545 Low back pain, unspecified: Secondary | ICD-10-CM

## 2017-12-29 DIAGNOSIS — R2689 Other abnormalities of gait and mobility: Secondary | ICD-10-CM | POA: Insufficient documentation

## 2017-12-29 DIAGNOSIS — M5440 Lumbago with sciatica, unspecified side: Secondary | ICD-10-CM | POA: Insufficient documentation

## 2017-12-29 DIAGNOSIS — M6281 Muscle weakness (generalized): Secondary | ICD-10-CM | POA: Insufficient documentation

## 2017-12-29 DIAGNOSIS — G8929 Other chronic pain: Secondary | ICD-10-CM | POA: Diagnosis not present

## 2017-12-29 DIAGNOSIS — R2681 Unsteadiness on feet: Secondary | ICD-10-CM | POA: Insufficient documentation

## 2017-12-29 DIAGNOSIS — R293 Abnormal posture: Secondary | ICD-10-CM | POA: Diagnosis not present

## 2017-12-29 NOTE — Therapy (Signed)
[place], [place], [address] Phone: [phone]   Fax:  [phone]  Physical Therapy Evaluation  Patient Details  Name: Curtis Riley MRN: [id number] Date of Birth: 1934/09/19 Referring Provider: Jill Alexanders, MD   Encounter Date: 12/29/2017  PT End of Session - 12/29/17 1515    Visit Number  1    Number of Visits  13    Date for PT Re-Evaluation  02/09/18    Authorization Type  Medicare primary - Kx mod by 15th visit and progress note by 10th, [employer] secondary - PT only    PT Start Time  1417    PT Stop Time  1502    PT Time Calculation (min)  45 min    Activity Tolerance  Patient tolerated treatment well    Behavior During Therapy  [facility] for tasks assessed/performed       Past Medical History:  Diagnosis Date  . Angina    occasional chest pain  never been treated for this ...   . Arthritis   . BPH (benign prostatic hyperplasia)   . COPD (chronic obstructive pulmonary disease) ([place])   . CVA (cerebral infarction)   . Diverticulosis   . Dyslipidemia   . Family history of coronary artery disease    brothers, sisters  . History of nuclear stress test 03/2010   dobutamine; mild ischemia in mid inferior and apical inferior regions; ekg negative for ischemia; low risk scan   . Hypertension   . Kidney stones    10-15 yrs ago  . Neuromuscular disorder ([place])   . Shortness of breath    with exertion   . Sleep apnea    uses cpap  . Smoker     Past Surgical History:  Procedure Laterality Date  . ANTERIOR CERVICAL DECOMP/DISCECTOMY FUSION  10/06/2011   Procedure: ANTERIOR CERVICAL DECOMPRESSION/DISCECTOMY FUSION 3 LEVELS;  Surgeon: Faythe Ghee, MD;  Location: [facility];  Service: Neurosurgery;  Laterality: Bilateral;  Cervical three-four, four-five, five-six anterior cervical decompression, fusion  . CARDIAC CATHETERIZATION  10/2010   small bridging segment in LAD  . TONSILLECTOMY  age 83  .  TRANSTHORACIC ECHOCARDIOGRAM  2004   normal LV size and function, aortic root dilated  . TRANSURETHRAL RESECTION OF PROSTATE  08/1996  . TRANSURETHRAL RESECTION OF PROSTATE     long time ago per pt.  10-15 yrs    There were no vitals filed for this visit.   Subjective Assessment - 12/29/17 1434    Subjective  Long history of back pain since mid 30s. Back pain got progressively worse in the last 2 weeks and is referring to the front of the patient's legs. Physician referred pt to physical therapy.     Limitations  Standing;Walking;House hold activities;Lifting    How long can you sit comfortably?  unlimited    How long can you stand comfortably?  1 minute or less    How long can you walk comfortably?  1 minute or less    Patient Stated Goals  decrease or eliminate pain    Currently in Pain?  Yes    Pain Score  2     Pain Location  Back    Pain Orientation  Mid    Pain Descriptors / Indicators  Stabbing;Sore sore when sitting, stabbing when standing    Pain Type  Chronic pain    Pain Radiating Towards  front of B thighs    Pain Onset  More than a month ago    Pain Frequency  Intermittent    Aggravating Factors   walking, standing, any physical activity    Pain Relieving Factors  sitting, ibuprofen, ice and heat    Effect of Pain on Daily Activities  all physical activities affected         [facility] PT Assessment - 12/29/17 0001      Assessment   Medical Diagnosis  Low back pain without sciatica    Referring Provider  Jill Alexanders, MD    Onset Date/Surgical Date  -- back pain started in mid 35s    Hand Dominance  Left    Next MD Visit  make next MD visit    Prior Therapy  yes, for low back pain      Precautions   Precautions  None      Restrictions   Weight Bearing Restrictions  No      Balance Screen   Has the patient fallen in the past 6 months  No    Has the patient had a decrease in activity level because of a fear of falling?   No    Is the patient reluctant to  leave their home because of a fear of falling?   No      Home Environment   Living Environment  Private residence    Living Arrangements  Alone    Type of [place] Access  Level entry    [place] - single point      Prior Function   Level of Independence  Independent with basic ADLs;Needs assistance with homemaking daughter helps with cleaning    Vocation  Retired      Associate Professor   Overall Cognitive Status  Within Functional Limits for tasks assessed      Posture/Postural Control   Posture/Postural Control  Postural limitations    Postural Limitations  Rounded Shoulders;Forward head      ROM / Strength   AROM / PROM / Strength  AROM;PROM;Strength      AROM   Overall AROM   Deficits    AROM Assessment Site  Lumbar    Lumbar Flexion  65    Lumbar Extension  10 pain noted in the top of the thighs bil    Lumbar - Right Side Bend  18    Lumbar - Left Side Bend  20      Strength   Strength Assessment Site  Knee    Right/Left Hip  Right;Left    Right Hip Flexion  4-/5    Right Hip Extension  3-/5 assessed in supine due to inability to lay prone    Right Hip ABduction  3-/5    Left Hip Flexion  4+/5    Left Hip Extension  3-/5 assessed in supine due to inability to lay prone    Left Hip ABduction  3-/5    Right/Left Knee  Right;Left    Right Knee Flexion  5/5    Right Knee Extension  5/5    Left Knee Flexion  5/5    Left Knee Extension  5/5      Palpation   Spinal mobility  L1-L5 PAIVM hypomobil assessed in sidelying due to pt in ability to laying prone      Special Tests    Special Tests  Lumbar      Ambulation/Gait   Ambulation/Gait  Yes  Ambulation/Gait Assistance  6: Modified independent (Device/Increase time)    Assistive device  Straight cane    Gait Pattern  Step-through pattern;Right flexed knee in stance;Left flexed knee in stance;Narrow base of support;Decreased stride length    Gait Comments  Excessive  genu valgum bil                Objective measurements completed on examination: See above findings.              PT Education - 12/29/17 1515    Education Details  POC, HEP, examination findings    Person(s) Educated  Patient    Methods  Explanation;Handout    Comprehension  Verbalized understanding       PT Short Term Goals - 12/29/17 1532      PT SHORT TERM GOAL #1   Title  Pt will be independent with HEP    Time  3    Period  Weeks    Status  New    Target Date  01/19/18      PT SHORT TERM GOAL #2   Title  Pt will demonstrate proper posture in sitting and standing without VC greater than 75% of the time to reduce back pain and abnormal muscle tension.    Time  3    Period  Weeks    Status  New    Target Date  01/19/18        PT Long Term Goals - 12/29/17 1535      PT LONG TERM GOAL #1   Title  Pt will demonstrate increased lumbar extension AROM to >/= 20 degrees to improve functional mobility and decrease pain    Time  6    Period  Weeks    Status  New    Target Date  02/09/18      PT LONG TERM GOAL #2   Title  Pt will improve overall hip strength to >/= 4/5 MMT strength to improve functional mobility and decrease back pain    Time  6    Period  Weeks    Status  New    Target Date  02/09/18      PT LONG TERM GOAL #3   Title  Pt will stand for >/= 5 min without an increase in pain to improve quality of life and functional mobility    Time  6    Period  Weeks    Status  New    Target Date  02/09/18      PT LONG TERM GOAL #4   Title  FOTO will improve from 56% limited to </= 36% limited to demonstrate improved quality of life and improved functional mobility.     Time  6    Period  Weeks    Status  New    Target Date  02/09/18             Plan - 12/29/17 1517    Clinical Impression Statement  Pt is a 83 yo male who presents to OPPT for CC of low back pain. Pt has dealt with back pain most of his life and has received PT  previously at this location. He demonstrates deficits in posture and gait. He utilizes a SPC and displays significant genu valgum and forward flexed posture during ambulation. Evaluation reveals deficits in lumbar AROM, hip strength, and hip flexibility. Pt would benefit from continued OPPT services to address pain, strength, ROM & flexibility, and gait & functional mobility.  History and Personal Factors relevant to plan of care:  Pt lives alone. Multiple comorbidities.     Clinical Presentation  Stable    Clinical Decision Making  Low    Rehab Potential  Good    PT Frequency  2x / week    PT Duration  6 weeks    PT Treatment/Interventions  Therapeutic exercise;Therapeutic activities;Functional mobility training;Gait training;Balance training;Patient/family education;Manual techniques    PT Next Visit Plan  Assess hamstring flexibility, Nustep, lumbar PAIVMs, hip flexor stretching, gastoc/soleus stretching, lower trunk rotations, pelvic tilts, bridges with core activation, supine clamshells, standing hip abd and ext, sit to stands    PT Home Exercise Plan  hip flexor stretching, bridges, supine clamshells    Consulted and Agree with Plan of Care  Patient       Patient will benefit from skilled therapeutic intervention in order to improve the following deficits and impairments:  Abnormal gait, Pain, Decreased mobility, Hypomobility, Postural dysfunction, Decreased activity tolerance, Decreased range of motion, Decreased strength, Decreased balance, Difficulty walking, Impaired flexibility, Increased fascial restricitons  Visit Diagnosis: Chronic midline low back pain without sciatica  Abnormal posture  Other abnormalities of gait and mobility     Problem List Patient Active Problem List   Diagnosis Date Noted  . Smoker   . Sleep apnea   . Shortness of breath   . Neuromuscular disorder ([place])   . Kidney stones   . Hypertension   . Family history of coronary artery disease   .  Dyslipidemia   . Diverticulosis   . COPD (chronic obstructive pulmonary disease) ([place])   . BPH (benign prostatic hyperplasia)   . Dyspnea 04/29/2017  . Chest pain 03/23/2017  . Arthritis 12/21/2016  . Lung nodules 12/17/2016  . History of asbestos exposure 09/22/2016  . Chronic diastolic heart failure ([place]) 09/22/2016  . Chronic back pain 06/09/2016  . Congestive heart failure ([place]) 10/19/2014  . OSA on CPAP 10/18/2014  . Chronic obstructive pulmonary disease ([place]) 10/09/2014  . Leukocytosis 10/09/2014  . Chronic respiratory failure with hypoxia ([place]) 03/29/2014  . Essential hypertension 03/26/2011  . History of nuclear stress test 03/20/2010    Worthy Flank, SPT 12/29/17 4:05 PM    [facility] [address] [place], [place], [address] Phone: [phone]   Fax:  [phone]  Name: LADAVION SAVITZ MRN: [id number] Date of Birth: 01/29/1935

## 2018-01-06 ENCOUNTER — Ambulatory Visit: Payer: Medicare Other | Admitting: Physical Therapy

## 2018-01-06 DIAGNOSIS — M5440 Lumbago with sciatica, unspecified side: Secondary | ICD-10-CM

## 2018-01-06 DIAGNOSIS — G8929 Other chronic pain: Secondary | ICD-10-CM

## 2018-01-06 DIAGNOSIS — R2689 Other abnormalities of gait and mobility: Secondary | ICD-10-CM

## 2018-01-06 DIAGNOSIS — R2681 Unsteadiness on feet: Secondary | ICD-10-CM

## 2018-01-06 DIAGNOSIS — R293 Abnormal posture: Secondary | ICD-10-CM | POA: Diagnosis not present

## 2018-01-06 DIAGNOSIS — M6281 Muscle weakness (generalized): Secondary | ICD-10-CM

## 2018-01-06 DIAGNOSIS — M545 Low back pain, unspecified: Secondary | ICD-10-CM

## 2018-01-06 NOTE — Therapy (Signed)
[place], [place], [address] Phone: [phone]   Fax:  [phone]  Physical Therapy Treatment  Patient Details  Name: Curtis Riley MRN: [id number] Date of Birth: 1935-05-30 Referring Provider: Jill Alexanders, MD   Encounter Date: 01/06/2018  PT End of Session - 01/06/18 1638    Visit Number  2    Number of Visits  13    Date for PT Re-Evaluation  02/09/18    Authorization Type  Medicare primary - Kx mod by 15th visit and progress note by 10th, [employer] secondary - PT only    PT Start Time  1631    PT Stop Time  1716    PT Time Calculation (min)  45 min    Activity Tolerance  Patient tolerated treatment well    Behavior During Therapy  [facility] for tasks assessed/performed       Past Medical History:  Diagnosis Date  . Angina    occasional chest pain  never been treated for this ...   . Arthritis   . BPH (benign prostatic hyperplasia)   . COPD (chronic obstructive pulmonary disease) ([place])   . CVA (cerebral infarction)   . Diverticulosis   . Dyslipidemia   . Family history of coronary artery disease    brothers, sisters  . History of nuclear stress test 03/2010   dobutamine; mild ischemia in mid inferior and apical inferior regions; ekg negative for ischemia; low risk scan   . Hypertension   . Kidney stones    10-15 yrs ago  . Neuromuscular disorder ([place])   . Shortness of breath    with exertion   . Sleep apnea    uses cpap  . Smoker     Past Surgical History:  Procedure Laterality Date  . ANTERIOR CERVICAL DECOMP/DISCECTOMY FUSION  10/06/2011   Procedure: ANTERIOR CERVICAL DECOMPRESSION/DISCECTOMY FUSION 3 LEVELS;  Surgeon: Faythe Ghee, MD;  Location: [facility];  Service: Neurosurgery;  Laterality: Bilateral;  Cervical three-four, four-five, five-six anterior cervical decompression, fusion  . CARDIAC CATHETERIZATION  10/2010   small bridging segment in LAD  . TONSILLECTOMY  age 83  .  TRANSTHORACIC ECHOCARDIOGRAM  2004   normal LV size and function, aortic root dilated  . TRANSURETHRAL RESECTION OF PROSTATE  08/1996  . TRANSURETHRAL RESECTION OF PROSTATE     long time ago per pt.  10-15 yrs    There were no vitals filed for this visit.  Subjective Assessment - 01/06/18 1632    Subjective  "I'm feeling okay. My pain comes at goes. It is more when I stand for long periods of time. I have been doing my exercises at night."    Currently in Pain?  Yes    Pain Score  2     Pain Location  Back    Pain Orientation  Lower    Pain Descriptors / Indicators  Sharp    Pain Type  Chronic pain    Pain Onset  More than a month ago    Aggravating Factors   standing for long periods    Pain Relieving Factors  ibuprofen, heating pad                       [facility] Adult PT Treatment/Exercise - 01/06/18 0001      Exercises   Exercises  Lumbar;Knee/Hip      Lumbar Exercises: Stretches   Passive Hamstring Stretch  2 reps;Right;Left;30 seconds PNF  hold relax    Lower Trunk Rotation  -- x 10 reps    Gastroc Stretch  Right;Left;2 reps;30 seconds      Lumbar Exercises: Aerobic   Nustep  L4 x 5 min      Lumbar Exercises: Supine   Pelvic Tilt  10 reps;5 seconds verbal cues for TA contraction    Clam  10 reps with red tband. one side at a time    Bridge  10 reps      Knee/Hip Exercises: Standing   Hip Abduction  1 set;Both;10 reps;Knee straight cues for upright posture             PT Education - 01/06/18 1720    Education Details  HEP review and update    Person(s) Educated  Patient    Methods  Explanation;Handout;Verbal cues    Comprehension  Verbalized understanding       PT Short Term Goals - 12/29/17 1532      PT SHORT TERM GOAL #1   Title  Pt will be independent with HEP    Time  3    Period  Weeks    Status  New    Target Date  01/19/18      PT SHORT TERM GOAL #2   Title  Pt will demonstrate proper posture in sitting and standing without  VC greater than 75% of the time to reduce back pain and abnormal muscle tension.    Time  3    Period  Weeks    Status  New    Target Date  01/19/18        PT Long Term Goals - 12/29/17 1535      PT LONG TERM GOAL #1   Title  Pt will demonstrate increased lumbar extension AROM to >/= 20 degrees to improve functional mobility and decrease pain    Time  6    Period  Weeks    Status  New    Target Date  02/09/18      PT LONG TERM GOAL #2   Title  Pt will improve overall hip strength to >/= 4/5 MMT strength to improve functional mobility and decrease back pain    Time  6    Period  Weeks    Status  New    Target Date  02/09/18      PT LONG TERM GOAL #3   Title  Pt will stand for >/= 10 min with LRAD without an increase in pain to improve quality of life and functional mobility    Time  6    Period  Weeks    Status  New    Target Date  02/09/18      PT LONG TERM GOAL #4   Title  FOTO will improve from 56% limited to </= 36% limited to demonstrate improved quality of life and improved functional mobility.     Time  6    Period  Weeks    Status  New    Target Date  02/09/18            Plan - 01/06/18 1720    Clinical Impression Statement  Pt reports he has occasional pain in his low back that is more prevalent after he has stood for a long period of time. He thinks the exercises are helping. Treatment today focused on stretching of gastrocs, hip flexors, and hamstrings. He demonstrates limited hamstring flexibility bilaterally. Also focused on hip and core strengthening.  Rehab Potential  Good    PT Treatment/Interventions  Therapeutic exercise;Therapeutic activities;Functional mobility training;Gait training;Balance training;Patient/family education;Manual techniques;ADLs/Self Care Home Management;Cryotherapy;Electrical Stimulation;Iontophoresis 4mg /ml Dexamethasone;Moist Heat;Ultrasound;Dry needling;Taping    PT Next Visit Plan  Assess hamstring flexibility, Nustep,  lumbar PAIVMs, hip flexor stretching, gastoc/soleus stretching, lower trunk rotations, pelvic tilts, bridges with core activation, supine clamshells, standing hip abd and ext, sit to stands    PT Home Exercise Plan  hip flexor stretching, bridges, supine clamshells; lower trunk rotations, standing hip abduction    Consulted and Agree with Plan of Care  Patient       Patient will benefit from skilled therapeutic intervention in order to improve the following deficits and impairments:  Abnormal gait, Pain, Decreased mobility, Hypomobility, Postural dysfunction, Decreased activity tolerance, Decreased range of motion, Decreased strength, Decreased balance, Difficulty walking, Impaired flexibility, Increased fascial restricitons  Visit Diagnosis: Chronic midline low back pain without sciatica  Abnormal posture  Other abnormalities of gait and mobility  Muscle weakness (generalized)  Chronic midline low back pain with sciatica, sciatica laterality unspecified  Unsteadiness on feet     Problem List Patient Active Problem List   Diagnosis Date Noted  . Smoker   . Sleep apnea   . Shortness of breath   . Neuromuscular disorder ([place])   . Kidney stones   . Hypertension   . Family history of coronary artery disease   . Dyslipidemia   . Diverticulosis   . COPD (chronic obstructive pulmonary disease) ([place])   . BPH (benign prostatic hyperplasia)   . Dyspnea 04/29/2017  . Chest pain 03/23/2017  . Arthritis 12/21/2016  . Lung nodules 12/17/2016  . History of asbestos exposure 09/22/2016  . Chronic diastolic heart failure ([place]) 09/22/2016  . Chronic back pain 06/09/2016  . Congestive heart failure ([place]) 10/19/2014  . OSA on CPAP 10/18/2014  . Chronic obstructive pulmonary disease ([place]) 10/09/2014  . Leukocytosis 10/09/2014  . Chronic respiratory failure with hypoxia ([place]) 03/29/2014  . Essential hypertension 03/26/2011  . History of nuclear stress test 03/20/2010   Worthy Flank, SPT 01/06/18 5:24 PM   [facility] [address] [place], [place], [address] Phone: [phone]   Fax:  [phone]  Name: Curtis Riley MRN: [id number] Date of Birth: 01-24-1935

## 2018-01-11 ENCOUNTER — Other Ambulatory Visit: Payer: Self-pay

## 2018-01-11 ENCOUNTER — Encounter: Payer: Self-pay | Admitting: Physical Therapy

## 2018-01-11 ENCOUNTER — Ambulatory Visit: Payer: Medicare Other | Admitting: Physical Therapy

## 2018-01-11 DIAGNOSIS — R293 Abnormal posture: Secondary | ICD-10-CM | POA: Diagnosis not present

## 2018-01-11 DIAGNOSIS — M545 Low back pain, unspecified: Secondary | ICD-10-CM

## 2018-01-11 DIAGNOSIS — R2689 Other abnormalities of gait and mobility: Secondary | ICD-10-CM

## 2018-01-11 DIAGNOSIS — G8929 Other chronic pain: Secondary | ICD-10-CM

## 2018-01-11 DIAGNOSIS — R2681 Unsteadiness on feet: Secondary | ICD-10-CM

## 2018-01-11 DIAGNOSIS — M6281 Muscle weakness (generalized): Secondary | ICD-10-CM | POA: Diagnosis not present

## 2018-01-11 DIAGNOSIS — M5440 Lumbago with sciatica, unspecified side: Secondary | ICD-10-CM | POA: Diagnosis not present

## 2018-01-11 NOTE — Therapy (Signed)
[place], [place], [address] Phone: [phone]   Fax:  [phone]  Physical Therapy Treatment  Patient Details  Name: Curtis Riley MRN: [id number] Date of Birth: Aug 22, 1934 Referring Provider: Jill Alexanders, MD   Encounter Date: 01/11/2018  PT End of Session - 01/11/18 1328    Visit Number  3    Number of Visits  13    Date for PT Re-Evaluation  02/09/18    Authorization Type  Medicare primary - Kx mod by 15th visit and progress note by 10th, [employer] secondary - PT only    PT Start Time  1330       Past Medical History:  Diagnosis Date  . Angina    occasional chest pain  never been treated for this ...   . Arthritis   . BPH (benign prostatic hyperplasia)   . COPD (chronic obstructive pulmonary disease) ([place])   . CVA (cerebral infarction)   . Diverticulosis   . Dyslipidemia   . Family history of coronary artery disease    brothers, sisters  . History of nuclear stress test 03/2010   dobutamine; mild ischemia in mid inferior and apical inferior regions; ekg negative for ischemia; low risk scan   . Hypertension   . Kidney stones    10-15 yrs ago  . Neuromuscular disorder ([place])   . Shortness of breath    with exertion   . Sleep apnea    uses cpap  . Smoker     Past Surgical History:  Procedure Laterality Date  . ANTERIOR CERVICAL DECOMP/DISCECTOMY FUSION  10/06/2011   Procedure: ANTERIOR CERVICAL DECOMPRESSION/DISCECTOMY FUSION 3 LEVELS;  Surgeon: Faythe Ghee, MD;  Location: [facility];  Service: Neurosurgery;  Laterality: Bilateral;  Cervical three-four, four-five, five-six anterior cervical decompression, fusion  . CARDIAC CATHETERIZATION  10/2010   small bridging segment in LAD  . TONSILLECTOMY  age 32  . TRANSTHORACIC ECHOCARDIOGRAM  2004   normal LV size and function, aortic root dilated  . TRANSURETHRAL RESECTION OF PROSTATE  08/1996  . TRANSURETHRAL RESECTION OF PROSTATE     long time ago per pt.  10-15 yrs    There were no vitals filed for this visit.  Subjective Assessment - 01/11/18 1335    Subjective  I am feeling like the exercises are helping me.  the pain comes and goes. I do most of the exercises at night before I go to bed.     Limitations  Standing;Walking;House hold activities;Lifting    How long can you sit comfortably?  unlimited    How long can you stand comfortably?  1-2 minutes    How long can you walk comfortably?  1-2 minutes    Patient Stated Goals  decrease or eliminate pain    Currently in Pain?  Yes    Pain Score  2  at worst my pain is 7/10    Pain Location  Back    Pain Orientation  Lower    Pain Descriptors / Indicators  Sharp    Pain Onset  More than a month ago                       [facility] Adult PT Treatment/Exercise - 01/11/18 1344      Exercises   Exercises  Lumbar;Knee/Hip      Lumbar Exercises: Stretches   Active Hamstring Stretch  4 reps;30 seconds seated Hamstring stretch right and left 2 x  each. addedHEP    Active Hamstring Stretch Limitations  seated with leg extended and Hip hinge    Passive Hamstring Stretch  --    Lower Trunk Rotation  10 seconds;5 reps    Quadruped Mid Back Stretch  3 reps;20 seconds    Quadruped Mid Back Stretch Limitations  with feet on Ther Ex pad and modified with pillows to support to tolerance.  Pt reported back felt "good" no pain    Gastroc Stretch  Right;Left;2 reps;30 seconds      Lumbar Exercises: Aerobic   Nustep  L4 x 5 min      Lumbar Exercises: Standing   Heel Raises  10 reps holding onto counter    Other Standing Lumbar Exercises  attempted bil ue flex on wall slides to extend back x 8 Pt reports more pain in back so DC returned to supine flexion based      Lumbar Exercises: Supine   Pelvic Tilt  10 reps;5 seconds verbal cues for TA contraction    Clam  15 reps with red tband. one side at a time    Bent Knee Raise  10 reps right and left VC for breathing on  exertion    Bridge  -- 1 bridge withpain in LB    Bridge with [employer]  10 reps;3 seconds x 2  no pain      Knee/Hip Exercises: Standing   Hip Abduction  1 set;Both;10 reps;Knee straight VC for correct technique, posture and straight knee    Other Standing Knee Exercises  standing with pelvic tilt against wall x 4       Modalities   Modalities  --      Moist Heat Therapy   Number Minutes Moist Heat  --    Moist Heat Location  --      Manual Therapy   Manual Therapy  Joint mobilization    Joint Mobilization  PA mobs lumbar L- 5 over sacrum,  latera PA over L-4 /5 transverse process in prone with pillow and in childs pose modified for comfort             PT Education - 01/11/18 1749    Education Details  added sitting hamstring to use during day    Person(s) Educated  Patient    Methods  Explanation;Demonstration;Tactile cues;Verbal cues;Handout    Comprehension  Verbalized understanding;Returned demonstration       PT Short Term Goals - 01/11/18 1751      PT SHORT TERM GOAL #1   Title  Pt will be independent with HEP    Baseline  Basic flexibility addressed    Time  3    Period  Weeks    Status  On-going      PT SHORT TERM GOAL #2   Title  Pt will demonstrate proper posture in sitting and standing without VC greater than 75% of the time to reduce back pain and abnormal muscle tension.    Time  3    Period  Weeks    Status  On-going        PT Long Term Goals - 12/29/17 1535      PT LONG TERM GOAL #1   Title  Pt will demonstrate increased lumbar extension AROM to >/= 20 degrees to improve functional mobility and decrease pain    Time  6    Period  Weeks    Status  New    Target Date  02/09/18  PT LONG TERM GOAL #2   Title  Pt will improve overall hip strength to >/= 4/5 MMT strength to improve functional mobility and decrease back pain    Time  6    Period  Weeks    Status  New    Target Date  02/09/18      PT LONG TERM GOAL #3   Title  Pt  will stand for >/= 10 min with LRAD without an increase in pain to improve quality of life and functional mobility    Time  6    Period  Weeks    Status  New    Target Date  02/09/18      PT LONG TERM GOAL #4   Title  FOTO will improve from 56% limited to </= 36% limited to demonstrate improved quality of life and improved functional mobility.     Time  6    Period  Weeks    Status  New    Target Date  02/09/18            Plan - 01/11/18 1415    Clinical Impression Statement  Pt reports 2/10  pain today but it can get as high as 7/10.  Pt doing most of exercises at night at night . Pt instructed to do more stretcing Pelvic tilt on wall during day and hamstring stretch and others that bring relief during the day for comfort.  Pt reported no pain at the end of the session.  Emphasis on movement and pain management using the exercises to make movement thoughout day more comfortable., continue toward strengthening core / TrAbd. along with flexibility    Rehab Potential  Good    PT Frequency  2x / week    PT Duration  6 weeks    PT Treatment/Interventions  Therapeutic exercise;Therapeutic activities;Functional mobility training;Gait training;Balance training;Patient/family education;Manual techniques;ADLs/Self Care Home Management;Cryotherapy;Electrical Stimulation;Iontophoresis 4mg /ml Dexamethasone;Moist Heat;Ultrasound;Dry needling;Taping    PT Next Visit Plan  Assess hamstring flexibility, Nustep, lumbar PAIVMs, hip flexor stretching, gastoc/soleus stretching, lower trunk rotations, pelvic tilts, bridges with core activation, supine clamshells, standing hip abd and ext, sit to stands    PT Home Exercise Plan  hip flexor stretching, bridges, supine clamshells; lower trunk rotations, standing hip abduction    Consulted and Agree with Plan of Care  Patient       Patient will benefit from skilled therapeutic intervention in order to improve the following deficits and impairments:  Abnormal  gait, Pain, Decreased mobility, Hypomobility, Postural dysfunction, Decreased activity tolerance, Decreased range of motion, Decreased strength, Decreased balance, Difficulty walking, Impaired flexibility, Increased fascial restricitons  Visit Diagnosis: Chronic midline low back pain without sciatica  Abnormal posture  Other abnormalities of gait and mobility  Muscle weakness (generalized)  Chronic midline low back pain with sciatica, sciatica laterality unspecified  Unsteadiness on feet     Problem List Patient Active Problem List   Diagnosis Date Noted  . Smoker   . Sleep apnea   . Shortness of breath   . Neuromuscular disorder ([place])   . Kidney stones   . Hypertension   . Family history of coronary artery disease   . Dyslipidemia   . Diverticulosis   . COPD (chronic obstructive pulmonary disease) ([place])   . BPH (benign prostatic hyperplasia)   . Dyspnea 04/29/2017  . Chest pain 03/23/2017  . Arthritis 12/21/2016  . Lung nodules 12/17/2016  . History of asbestos exposure 09/22/2016  . Chronic diastolic heart failure ([place])  09/22/2016  . Chronic back pain 06/09/2016  . Congestive heart failure ([place]) 10/19/2014  . OSA on CPAP 10/18/2014  . Chronic obstructive pulmonary disease ([place]) 10/09/2014  . Leukocytosis 10/09/2014  . Chronic respiratory failure with hypoxia ([place]) 03/29/2014  . Essential hypertension 03/26/2011  . History of nuclear stress test 03/20/2010  Voncille Lo, PT Certified Exercise Expert for the Aging Adult  01/11/18 5:56 PM Phone: [phone] Fax: [employer] [facility] [address] [place], [place], [address] Phone: [phone]   Fax:  [phone]  Name: Curtis Riley MRN: [id number] Date of Birth: [birth year]

## 2018-01-11 NOTE — Patient Instructions (Signed)
Leg Extension (Hamstring)    [place] Exercise.   Siit toward front edge of chair, with leg out straight, heel on floor, toes pointing toward body. Keeping back straight, bend forward at hip, breathing out through pursed lips. Return, breathing in. Repeat ___ times. Repeat with other leg. Do ___ sessions per day. Variation: Perform from standing position, with support.  Voncille Lo, PT Certified Exercise Expert for the Aging Adult  01/11/18 1:49 PM Phone: [phone] Fax: [phone]

## 2018-01-13 ENCOUNTER — Encounter: Payer: Self-pay | Admitting: Physical Therapy

## 2018-01-13 ENCOUNTER — Ambulatory Visit: Payer: Medicare Other | Admitting: Physical Therapy

## 2018-01-13 DIAGNOSIS — R2689 Other abnormalities of gait and mobility: Secondary | ICD-10-CM

## 2018-01-13 DIAGNOSIS — G8929 Other chronic pain: Secondary | ICD-10-CM

## 2018-01-13 DIAGNOSIS — M6281 Muscle weakness (generalized): Secondary | ICD-10-CM

## 2018-01-13 DIAGNOSIS — R293 Abnormal posture: Secondary | ICD-10-CM

## 2018-01-13 DIAGNOSIS — M545 Low back pain, unspecified: Secondary | ICD-10-CM

## 2018-01-13 DIAGNOSIS — M5440 Lumbago with sciatica, unspecified side: Secondary | ICD-10-CM | POA: Diagnosis not present

## 2018-01-13 DIAGNOSIS — R2681 Unsteadiness on feet: Secondary | ICD-10-CM

## 2018-01-13 NOTE — Therapy (Signed)
[place], [place], [address] Phone: [phone]   Fax:  [phone]  Physical Therapy Treatment  Patient Details  Name: Curtis Riley MRN: [id number] Date of Birth: 08-30-1934 Referring Provider: Jill Alexanders, MD   Encounter Date: 01/13/2018  PT End of Session - 01/13/18 1100    Visit Number  4    Number of Visits  13    Date for PT Re-Evaluation  02/09/18    Authorization Type  Medicare primary - Kx mod by 15th visit and progress note by 10th, [employer] secondary - PT only    PT Start Time  1017    PT Stop Time  1110    PT Time Calculation (min)  53 min    Activity Tolerance  Patient tolerated treatment well    Behavior During Therapy  [facility] for tasks assessed/performed       Past Medical History:  Diagnosis Date  . Angina    occasional chest pain  never been treated for this ...   . Arthritis   . BPH (benign prostatic hyperplasia)   . COPD (chronic obstructive pulmonary disease) ([place])   . CVA (cerebral infarction)   . Diverticulosis   . Dyslipidemia   . Family history of coronary artery disease    brothers, sisters  . History of nuclear stress test 03/2010   dobutamine; mild ischemia in mid inferior and apical inferior regions; ekg negative for ischemia; low risk scan   . Hypertension   . Kidney stones    10-15 yrs ago  . Neuromuscular disorder ([place])   . Shortness of breath    with exertion   . Sleep apnea    uses cpap  . Smoker     Past Surgical History:  Procedure Laterality Date  . ANTERIOR CERVICAL DECOMP/DISCECTOMY FUSION  10/06/2011   Procedure: ANTERIOR CERVICAL DECOMPRESSION/DISCECTOMY FUSION 3 LEVELS;  Surgeon: Faythe Ghee, MD;  Location: [facility];  Service: Neurosurgery;  Laterality: Bilateral;  Cervical three-four, four-five, five-six anterior cervical decompression, fusion  . CARDIAC CATHETERIZATION  10/2010   small bridging segment in LAD  . TONSILLECTOMY  age 37  .  TRANSTHORACIC ECHOCARDIOGRAM  2004   normal LV size and function, aortic root dilated  . TRANSURETHRAL RESECTION OF PROSTATE  08/1996  . TRANSURETHRAL RESECTION OF PROSTATE     long time ago per pt.  10-15 yrs    There were no vitals filed for this visit.  Subjective Assessment - 01/13/18 1028    Subjective  Patient with increased pain today, unsure what the cause is.  Pain comes and goes.     Currently in Pain?  Yes    Pain Score  8     Pain Location  Leg bilateral thighs     Pain Orientation  Right;Left;Anterior;Proximal    Pain Descriptors / Indicators  Sharp    Pain Type  Chronic pain    Pain Onset  More than a month ago    Pain Frequency  Intermittent    Aggravating Factors   standing     Pain Relieving Factors  heat, stretching                        [facility] Adult PT Treatment/Exercise - 01/13/18 0001      Lumbar Exercises: Stretches   Active Hamstring Stretch  3 reps;30 seconds    Passive Hamstring Stretch  2 reps;30 seconds    Lower Trunk  Rotation  10 seconds x 10     Pelvic Tilt  10 reps    Gastroc Stretch  Right;Left;2 reps;30 seconds    Gastroc Stretch Limitations  at wall       Lumbar Exercises: Aerobic   Nustep  L6 UE and LE for 6 min       Lumbar Exercises: Supine   Bridge  10 reps      Knee/Hip Exercises: Stretches   Hip Flexor Stretch  2 reps;30 seconds standing at counter       Moist Heat Therapy   Number Minutes Moist Heat  10 Minutes    Moist Heat Location  Lumbar Spine      Manual Therapy   Manual therapy comments  LAD bilateral x3 each              PT Education - 01/13/18 1100    Education Details  general exercise technique, mobility     Person(s) Educated  Patient    Methods  Explanation    Comprehension  Verbalized understanding       PT Short Term Goals - 01/11/18 1751      PT SHORT TERM GOAL #1   Title  Pt will be independent with HEP    Baseline  Basic flexibility addressed    Time  3    Period  Weeks     Status  On-going      PT SHORT TERM GOAL #2   Title  Pt will demonstrate proper posture in sitting and standing without VC greater than 75% of the time to reduce back pain and abnormal muscle tension.    Time  3    Period  Weeks    Status  On-going        PT Long Term Goals - 12/29/17 1535      PT LONG TERM GOAL #1   Title  Pt will demonstrate increased lumbar extension AROM to >/= 20 degrees to improve functional mobility and decrease pain    Time  6    Period  Weeks    Status  New    Target Date  02/09/18      PT LONG TERM GOAL #2   Title  Pt will improve overall hip strength to >/= 4/5 MMT strength to improve functional mobility and decrease back pain    Time  6    Period  Weeks    Status  New    Target Date  02/09/18      PT LONG TERM GOAL #3   Title  Pt will stand for >/= 10 min with LRAD without an increase in pain to improve quality of life and functional mobility    Time  6    Period  Weeks    Status  New    Target Date  02/09/18      PT LONG TERM GOAL #4   Title  FOTO will improve from 56% limited to </= 36% limited to demonstrate improved quality of life and improved functional mobility.     Time  6    Period  Weeks    Status  New    Target Date  02/09/18            Plan - 01/13/18 1101    Clinical Impression Statement  Increased pain today.  Able to reduce pain in thighs after mat exercises.  Long axis distraction reduced pain in legs as well.  Cont with POC  PT Treatment/Interventions  Therapeutic exercise;Therapeutic activities;Functional mobility training;Gait training;Balance training;Patient/family education;Manual techniques;ADLs/Self Care Home Management;Cryotherapy;Electrical Stimulation;Iontophoresis 4mg /ml Dexamethasone;Moist Heat;Ultrasound;Dry needling;Taping    PT Next Visit Plan  Assess hamstring flexibility, Nustep, lumbar PAIVMs, hip flexor stretching, gastoc/soleus stretching, lower trunk rotations, pelvic tilts, bridges with core  activation, supine clamshells, standing hip abd and ext, sit to stands    PT Home Exercise Plan  hip flexor stretching, bridges, supine clamshells; lower trunk rotations, standing hip abduction    Consulted and Agree with Plan of Care  Patient       Patient will benefit from skilled therapeutic intervention in order to improve the following deficits and impairments:  Abnormal gait, Pain, Decreased mobility, Hypomobility, Postural dysfunction, Decreased activity tolerance, Decreased range of motion, Decreased strength, Decreased balance, Difficulty walking, Impaired flexibility, Increased fascial restricitons  Visit Diagnosis: Chronic midline low back pain without sciatica  Abnormal posture  Other abnormalities of gait and mobility  Muscle weakness (generalized)  Chronic midline low back pain with sciatica, sciatica laterality unspecified  Unsteadiness on feet     Problem List Patient Active Problem List   Diagnosis Date Noted  . Smoker   . Sleep apnea   . Shortness of breath   . Neuromuscular disorder ([place])   . Kidney stones   . Hypertension   . Family history of coronary artery disease   . Dyslipidemia   . Diverticulosis   . COPD (chronic obstructive pulmonary disease) ([place])   . BPH (benign prostatic hyperplasia)   . Dyspnea 04/29/2017  . Chest pain 03/23/2017  . Arthritis 12/21/2016  . Lung nodules 12/17/2016  . History of asbestos exposure 09/22/2016  . Chronic diastolic heart failure ([place]) 09/22/2016  . Chronic back pain 06/09/2016  . Congestive heart failure ([place]) 10/19/2014  . OSA on CPAP 10/18/2014  . Chronic obstructive pulmonary disease ([place]) 10/09/2014  . Leukocytosis 10/09/2014  . Chronic respiratory failure with hypoxia ([place]) 03/29/2014  . Essential hypertension 03/26/2011  . History of nuclear stress test 03/20/2010    Ira Busbin 01/13/2018, 11:02 AM  [facility] [address] [place],  [place], [address] Phone: [phone]   Fax:  [phone]  Name: Curtis Riley MRN: [id number] Date of Birth: 04-29-1935  Raeford Razor, PT 01/13/18 11:02 AM Phone: [phone] Fax: [phone]

## 2018-01-17 ENCOUNTER — Ambulatory Visit: Payer: Medicare Other | Attending: Family Medicine | Admitting: Physical Therapy

## 2018-01-17 DIAGNOSIS — M5440 Lumbago with sciatica, unspecified side: Secondary | ICD-10-CM | POA: Diagnosis not present

## 2018-01-17 DIAGNOSIS — R293 Abnormal posture: Secondary | ICD-10-CM | POA: Diagnosis not present

## 2018-01-17 DIAGNOSIS — M545 Low back pain, unspecified: Secondary | ICD-10-CM

## 2018-01-17 DIAGNOSIS — G8929 Other chronic pain: Secondary | ICD-10-CM | POA: Diagnosis not present

## 2018-01-17 DIAGNOSIS — M6281 Muscle weakness (generalized): Secondary | ICD-10-CM

## 2018-01-17 DIAGNOSIS — R2689 Other abnormalities of gait and mobility: Secondary | ICD-10-CM | POA: Diagnosis not present

## 2018-01-17 DIAGNOSIS — R2681 Unsteadiness on feet: Secondary | ICD-10-CM | POA: Insufficient documentation

## 2018-01-17 NOTE — Therapy (Signed)
[place], [place], [address] Phone: [phone]   Fax:  [phone]  Physical Therapy Treatment  Patient Details  Name: Curtis Riley MRN: [id number] Date of Birth: 1935-07-16 Referring Provider: Jill Alexanders, MD   Encounter Date: 01/17/2018  PT End of Session - 01/17/18 1357    Visit Number  5    Number of Visits  13    Date for PT Re-Evaluation  02/09/18    Authorization Type  Medicare primary - Kx mod by 15th visit and progress note by 10th, [employer] secondary - PT only    PT Start Time  1330    PT Stop Time  1420    PT Time Calculation (min)  50 min    Activity Tolerance  Patient tolerated treatment well    Behavior During Therapy  [facility] for tasks assessed/performed       Past Medical History:  Diagnosis Date  . Angina    occasional chest pain  never been treated for this ...   . Arthritis   . BPH (benign prostatic hyperplasia)   . COPD (chronic obstructive pulmonary disease) ([place])   . CVA (cerebral infarction)   . Diverticulosis   . Dyslipidemia   . Family history of coronary artery disease    brothers, sisters  . History of nuclear stress test 03/2010   dobutamine; mild ischemia in mid inferior and apical inferior regions; ekg negative for ischemia; low risk scan   . Hypertension   . Kidney stones    10-15 yrs ago  . Neuromuscular disorder ([place])   . Shortness of breath    with exertion   . Sleep apnea    uses cpap  . Smoker     Past Surgical History:  Procedure Laterality Date  . ANTERIOR CERVICAL DECOMP/DISCECTOMY FUSION  10/06/2011   Procedure: ANTERIOR CERVICAL DECOMPRESSION/DISCECTOMY FUSION 3 LEVELS;  Surgeon: Faythe Ghee, MD;  Location: [facility];  Service: Neurosurgery;  Laterality: Bilateral;  Cervical three-four, four-five, five-six anterior cervical decompression, fusion  . CARDIAC CATHETERIZATION  10/2010   small bridging segment in LAD  . TONSILLECTOMY  age 54  .  TRANSTHORACIC ECHOCARDIOGRAM  2004   normal LV size and function, aortic root dilated  . TRANSURETHRAL RESECTION OF PROSTATE  08/1996  . TRANSURETHRAL RESECTION OF PROSTATE     long time ago per pt.  10-15 yrs    There were no vitals filed for this visit.  Subjective Assessment - 01/17/18 1330    Subjective  "my pain is coming and going today. I feel okay right now. It has gotten up to a 7-8/10 about 2-3 times today."    Currently in Pain?  No/denies    Pain Score  0-No pain    Pain Type  Chronic pain    Pain Onset  More than a month ago                       [facility] Adult PT Treatment/Exercise - 01/17/18 0001      Lumbar Exercises: Stretches   Active Hamstring Stretch  1 rep;30 seconds;Right;Left PNF hold relax technique      Lumbar Exercises: Aerobic   Nustep  L5 x 5 min LE only      Lumbar Exercises: Supine   Pelvic Tilt  10 reps;5 seconds cues for TA contraction    Clam  10 reps x 2 sets (one leg at a time 2nd set). green  theraband    Bent Knee Raise  15 reps    Bridge with [employer]  10 reps x 2 sets. cues for TA contraction    Other Supine Lumbar Exercises  ER with red theraband bilaterally 1 x 15 reps          Balance Exercises - 01/17/18 1409      Balance Exercises: Standing   Standing Eyes Opened  Narrow base of support (BOS);1 rep;Solid surface;30 secs    Standing Eyes Closed  Narrow base of support (BOS);Solid surface;2 reps;30 secs        PT Education - 01/17/18 1450    Education Details  HEP, steroid injection and importance of continuing with exercises    Person(s) Educated  Patient    Methods  Explanation    Comprehension  Verbalized understanding       PT Short Term Goals - 01/11/18 1751      PT SHORT TERM GOAL #1   Title  Pt will be independent with HEP    Baseline  Basic flexibility addressed    Time  3    Period  Weeks    Status  On-going      PT SHORT TERM GOAL #2   Title  Pt will demonstrate proper posture in  sitting and standing without VC greater than 75% of the time to reduce back pain and abnormal muscle tension.    Time  3    Period  Weeks    Status  On-going        PT Long Term Goals - 12/29/17 1535      PT LONG TERM GOAL #1   Title  Pt will demonstrate increased lumbar extension AROM to >/= 20 degrees to improve functional mobility and decrease pain    Time  6    Period  Weeks    Status  New    Target Date  02/09/18      PT LONG TERM GOAL #2   Title  Pt will improve overall hip strength to >/= 4/5 MMT strength to improve functional mobility and decrease back pain    Time  6    Period  Weeks    Status  New    Target Date  02/09/18      PT LONG TERM GOAL #3   Title  Pt will stand for >/= 10 min with LRAD without an increase in pain to improve quality of life and functional mobility    Time  6    Period  Weeks    Status  New    Target Date  02/09/18      PT LONG TERM GOAL #4   Title  FOTO will improve from 56% limited to </= 36% limited to demonstrate improved quality of life and improved functional mobility.     Time  6    Period  Weeks    Status  New    Target Date  02/09/18            Plan - 01/17/18 1420    Clinical Impression Statement  Treatment today focused on stretching of hamstrings and strengthening of core and hip musculature. Pt reports during session that his back pain was eliminated during his exercises. He also states he is able to stand longer than he normally does. He is considering talking to his phyisican about a steroid injection.     PT Treatment/Interventions  Therapeutic exercise;Therapeutic activities;Functional mobility training;Gait training;Balance training;Patient/family education;Manual techniques;ADLs/Self Care Home  Management;Cryotherapy;Electrical Stimulation;Iontophoresis 4mg /ml Dexamethasone;Moist Heat;Ultrasound;Dry needling;Taping    PT Next Visit Plan  Nustep, lumbar PAIVMs, hip flexor stretching, gastoc/soleus stretching, lower  trunk rotations, pelvic tilts in standing, bridges with ball squeeze, supine clamshells, standing hip abd and ext, sit to stands, progress balance as necessary    PT Home Exercise Plan  hip flexor stretching, bridges, supine clamshells; lower trunk rotations, standing hip abduction    Consulted and Agree with Plan of Care  Patient       Patient will benefit from skilled therapeutic intervention in order to improve the following deficits and impairments:  Abnormal gait, Pain, Decreased mobility, Hypomobility, Postural dysfunction, Decreased activity tolerance, Decreased range of motion, Decreased strength, Decreased balance, Difficulty walking, Impaired flexibility, Increased fascial restricitons  Visit Diagnosis: Chronic midline low back pain without sciatica  Abnormal posture  Other abnormalities of gait and mobility  Muscle weakness (generalized)  Chronic midline low back pain with sciatica, sciatica laterality unspecified  Unsteadiness on feet     Problem List Patient Active Problem List   Diagnosis Date Noted  . Smoker   . Sleep apnea   . Shortness of breath   . Neuromuscular disorder ([place])   . Kidney stones   . Hypertension   . Family history of coronary artery disease   . Dyslipidemia   . Diverticulosis   . COPD (chronic obstructive pulmonary disease) ([place])   . BPH (benign prostatic hyperplasia)   . Dyspnea 04/29/2017  . Chest pain 03/23/2017  . Arthritis 12/21/2016  . Lung nodules 12/17/2016  . History of asbestos exposure 09/22/2016  . Chronic diastolic heart failure ([place]) 09/22/2016  . Chronic back pain 06/09/2016  . Congestive heart failure ([place]) 10/19/2014  . OSA on CPAP 10/18/2014  . Chronic obstructive pulmonary disease ([place]) 10/09/2014  . Leukocytosis 10/09/2014  . Chronic respiratory failure with hypoxia ([place]) 03/29/2014  . Essential hypertension 03/26/2011  . History of nuclear stress test 03/20/2010    Worthy Flank, SPT 01/17/18 2:53 PM    [facility] [address] [place], [place], [address] Phone: [phone]   Fax:  [phone]  Name: Curtis Riley MRN: [id number] Date of Birth: 1935/03/09

## 2018-01-19 ENCOUNTER — Ambulatory Visit: Payer: Medicare Other | Admitting: Physical Therapy

## 2018-01-19 ENCOUNTER — Encounter: Payer: Self-pay | Admitting: Physical Therapy

## 2018-01-19 DIAGNOSIS — G8929 Other chronic pain: Secondary | ICD-10-CM

## 2018-01-19 DIAGNOSIS — M6281 Muscle weakness (generalized): Secondary | ICD-10-CM

## 2018-01-19 DIAGNOSIS — R2689 Other abnormalities of gait and mobility: Secondary | ICD-10-CM | POA: Diagnosis not present

## 2018-01-19 DIAGNOSIS — R293 Abnormal posture: Secondary | ICD-10-CM

## 2018-01-19 DIAGNOSIS — M545 Low back pain, unspecified: Secondary | ICD-10-CM

## 2018-01-19 DIAGNOSIS — M5440 Lumbago with sciatica, unspecified side: Secondary | ICD-10-CM | POA: Diagnosis not present

## 2018-01-19 DIAGNOSIS — R2681 Unsteadiness on feet: Secondary | ICD-10-CM

## 2018-01-19 NOTE — Therapy (Signed)
[place], [place], [address] Phone: [phone]   Fax:  [phone]  Physical Therapy Treatment  Patient Details  Name: Curtis Riley MRN: [id number] Date of Birth: Sep 28, 1934 Referring Provider: Jill Alexanders, MD   Encounter Date: 01/19/2018  PT End of Session - 01/19/18 1510    Visit Number  6    Number of Visits  13    Date for PT Re-Evaluation  02/09/18    Authorization Type  Medicare primary - Kx mod by 15th visit and progress note by 10th, [employer] secondary - PT only    PT Start Time  1432    PT Stop Time  1515    PT Time Calculation (min)  43 min    Activity Tolerance  Patient tolerated treatment well    Behavior During Therapy  [facility] for tasks assessed/performed       Past Medical History:  Diagnosis Date  . Angina    occasional chest pain  never been treated for this ...   . Arthritis   . BPH (benign prostatic hyperplasia)   . COPD (chronic obstructive pulmonary disease) ([place])   . CVA (cerebral infarction)   . Diverticulosis   . Dyslipidemia   . Family history of coronary artery disease    brothers, sisters  . History of nuclear stress test 03/2010   dobutamine; mild ischemia in mid inferior and apical inferior regions; ekg negative for ischemia; low risk scan   . Hypertension   . Kidney stones    10-15 yrs ago  . Neuromuscular disorder ([place])   . Shortness of breath    with exertion   . Sleep apnea    uses cpap  . Smoker     Past Surgical History:  Procedure Laterality Date  . ANTERIOR CERVICAL DECOMP/DISCECTOMY FUSION  10/06/2011   Procedure: ANTERIOR CERVICAL DECOMPRESSION/DISCECTOMY FUSION 3 LEVELS;  Surgeon: Faythe Ghee, MD;  Location: [facility];  Service: Neurosurgery;  Laterality: Bilateral;  Cervical three-four, four-five, five-six anterior cervical decompression, fusion  . CARDIAC CATHETERIZATION  10/2010   small bridging segment in LAD  . TONSILLECTOMY  age 83  .  TRANSTHORACIC ECHOCARDIOGRAM  2004   normal LV size and function, aortic root dilated  . TRANSURETHRAL RESECTION OF PROSTATE  08/1996  . TRANSURETHRAL RESECTION OF PROSTATE     long time ago per pt.  10-15 yrs    There were no vitals filed for this visit.  Subjective Assessment - 01/19/18 1435    Subjective  Patient cont to have intermittent severe pain today.  Can be 7/10 depends on what I'm doing.     Currently in Pain?  Yes    Pain Score  3     Pain Location  Back    Pain Orientation  Right;Left    Pain Descriptors / Indicators  Aching;Sore    Pain Onset  More than a month ago    Pain Frequency  Intermittent    Aggravating Factors   standing, walking     Pain Relieving Factors  heat, rest, sitting             [facility] Adult PT Treatment/Exercise - 01/19/18 0001      Lumbar Exercises: Stretches   Single Knee to Chest Stretch  3 reps    Lower Trunk Rotation  10 seconds with ball       Lumbar Exercises: Aerobic   Nustep  L5 x 5 min, UE and LE  Lumbar Exercises: Supine   Pelvic Tilt  10 reps;5 seconds cues for TA contraction    Heel Slides  15 reps ball bilateral knee and hip flexion     Bridge  10 reps      Lumbar Exercises: Sidelying   Clam  Both;10 reps 2 sets, 1 with blue band      Manual Therapy   Manual Therapy  Passive ROM    Manual therapy comments  LAD bilateral x3 each     Passive ROM  hip stretching, bilateral hamstring and rotation  ER/IR                PT Short Term Goals - 01/11/18 1751      PT SHORT TERM GOAL #1   Title  Pt will be independent with HEP    Baseline  Basic flexibility addressed    Time  3    Period  Weeks    Status  On-going      PT SHORT TERM GOAL #2   Title  Pt will demonstrate proper posture in sitting and standing without VC greater than 75% of the time to reduce back pain and abnormal muscle tension.    Time  3    Period  Weeks    Status  On-going        PT Long Term Goals - 12/29/17 1535      PT LONG  TERM GOAL #1   Title  Pt will demonstrate increased lumbar extension AROM to >/= 20 degrees to improve functional mobility and decrease pain    Time  6    Period  Weeks    Status  New    Target Date  02/09/18      PT LONG TERM GOAL #2   Title  Pt will improve overall hip strength to >/= 4/5 MMT strength to improve functional mobility and decrease back pain    Time  6    Period  Weeks    Status  New    Target Date  02/09/18      PT LONG TERM GOAL #3   Title  Pt will stand for >/= 10 min with LRAD without an increase in pain to improve quality of life and functional mobility    Time  6    Period  Weeks    Status  New    Target Date  02/09/18      PT LONG TERM GOAL #4   Title  FOTO will improve from 56% limited to </= 36% limited to demonstrate improved quality of life and improved functional mobility.     Time  6    Period  Weeks    Status  New    Target Date  02/09/18            Plan - 01/19/18 1516    Clinical Impression Statement  Patient initially was not feeling up to exercising.  He was able to tolerate mat level strengthening without increased pain.  Left with less pain.  Pt with good hip mobility.      PT Treatment/Interventions  Therapeutic exercise;Therapeutic activities;Functional mobility training;Gait training;Balance training;Patient/family education;Manual techniques;ADLs/Self Care Home Management;Cryotherapy;Electrical Stimulation;Iontophoresis 4mg /ml Dexamethasone;Moist Heat;Ultrasound;Dry needling;Taping    PT Next Visit Plan  Nustep, progress hip strength, core to tolerance.  Parallel bars and mirror for balance, gait     PT Home Exercise Plan  hip flexor stretching, bridges, supine clamshells; lower trunk rotations, standing hip abduction    Consulted and  Agree with Plan of Care  Patient       Patient will benefit from skilled therapeutic intervention in order to improve the following deficits and impairments:  Abnormal gait, Pain, Decreased mobility,  Hypomobility, Postural dysfunction, Decreased activity tolerance, Decreased range of motion, Decreased strength, Decreased balance, Difficulty walking, Impaired flexibility, Increased fascial restricitons  Visit Diagnosis: Chronic midline low back pain without sciatica  Abnormal posture  Other abnormalities of gait and mobility  Muscle weakness (generalized)  Chronic midline low back pain with sciatica, sciatica laterality unspecified  Unsteadiness on feet     Problem List Patient Active Problem List   Diagnosis Date Noted  . Smoker   . Sleep apnea   . Shortness of breath   . Neuromuscular disorder ([place])   . Kidney stones   . Hypertension   . Family history of coronary artery disease   . Dyslipidemia   . Diverticulosis   . COPD (chronic obstructive pulmonary disease) ([place])   . BPH (benign prostatic hyperplasia)   . Dyspnea 04/29/2017  . Chest pain 03/23/2017  . Arthritis 12/21/2016  . Lung nodules 12/17/2016  . History of asbestos exposure 09/22/2016  . Chronic diastolic heart failure ([place]) 09/22/2016  . Chronic back pain 06/09/2016  . Congestive heart failure ([place]) 10/19/2014  . OSA on CPAP 10/18/2014  . Chronic obstructive pulmonary disease ([place]) 10/09/2014  . Leukocytosis 10/09/2014  . Chronic respiratory failure with hypoxia ([place]) 03/29/2014  . Essential hypertension 03/26/2011  . History of nuclear stress test 03/20/2010    Kort Stettler 01/19/2018, 3:23 PM  [facility] [address] [place], [place], [address] Phone: [phone]   Fax:  [phone]  Name: Curtis Riley MRN: [id number] Date of Birth: 07-16-1935  Raeford Razor, PT 01/19/18 3:23 PM Phone: [phone] Fax: [phone]

## 2018-01-19 NOTE — Patient Instructions (Signed)
Knee to Chest    Lying supine, bend involved knee to chest _3__ times. Hold 30 sec x 3.  Repeat with other leg. Do __2_ times per day.  Copyright  VHI. All rights reserved.

## 2018-01-25 ENCOUNTER — Ambulatory Visit: Payer: Medicare Other | Admitting: Physical Therapy

## 2018-01-25 DIAGNOSIS — M5440 Lumbago with sciatica, unspecified side: Secondary | ICD-10-CM | POA: Diagnosis not present

## 2018-01-25 DIAGNOSIS — R2689 Other abnormalities of gait and mobility: Secondary | ICD-10-CM | POA: Diagnosis not present

## 2018-01-25 DIAGNOSIS — G8929 Other chronic pain: Secondary | ICD-10-CM

## 2018-01-25 DIAGNOSIS — M545 Low back pain: Principal | ICD-10-CM

## 2018-01-25 DIAGNOSIS — M6281 Muscle weakness (generalized): Secondary | ICD-10-CM

## 2018-01-25 DIAGNOSIS — R293 Abnormal posture: Secondary | ICD-10-CM

## 2018-01-25 DIAGNOSIS — R2681 Unsteadiness on feet: Secondary | ICD-10-CM

## 2018-01-25 NOTE — Therapy (Signed)
[place], [place], [address] Phone: [phone]   Fax:  [phone]  Physical Therapy Treatment  Patient Details  Name: Curtis Riley MRN: [id number] Date of Birth: 1935/04/06 Referring Provider: Jill Alexanders, MD   Encounter Date: 01/25/2018  PT End of Session - 01/25/18 1419    Visit Number  7    Number of Visits  13    Date for PT Re-Evaluation  02/09/18    Authorization Type  Medicare primary - Kx mod by 15th visit and progress note by 10th, [employer] secondary - PT only    PT Start Time  1332    PT Stop Time  1417    PT Time Calculation (min)  45 min    Activity Tolerance  Patient tolerated treatment well    Behavior During Therapy  [facility] for tasks assessed/performed       Past Medical History:  Diagnosis Date  . Angina    occasional chest pain  never been treated for this ...   . Arthritis   . BPH (benign prostatic hyperplasia)   . COPD (chronic obstructive pulmonary disease) ([place])   . CVA (cerebral infarction)   . Diverticulosis   . Dyslipidemia   . Family history of coronary artery disease    brothers, sisters  . History of nuclear stress test 03/2010   dobutamine; mild ischemia in mid inferior and apical inferior regions; ekg negative for ischemia; low risk scan   . Hypertension   . Kidney stones    10-15 yrs ago  . Neuromuscular disorder ([place])   . Shortness of breath    with exertion   . Sleep apnea    uses cpap  . Smoker     Past Surgical History:  Procedure Laterality Date  . ANTERIOR CERVICAL DECOMP/DISCECTOMY FUSION  10/06/2011   Procedure: ANTERIOR CERVICAL DECOMPRESSION/DISCECTOMY FUSION 3 LEVELS;  Surgeon: Faythe Ghee, MD;  Location: [facility];  Service: Neurosurgery;  Laterality: Bilateral;  Cervical three-four, four-five, five-six anterior cervical decompression, fusion  . CARDIAC CATHETERIZATION  10/2010   small bridging segment in LAD  . TONSILLECTOMY  age 69  .  TRANSTHORACIC ECHOCARDIOGRAM  2004   normal LV size and function, aortic root dilated  . TRANSURETHRAL RESECTION OF PROSTATE  08/1996  . TRANSURETHRAL RESECTION OF PROSTATE     long time ago per pt.  10-15 yrs    There were no vitals filed for this visit.  Subjective Assessment - 01/25/18 1337    Subjective  Pt reports decreased pain this past weekend but is having some more pain today.    Currently in Pain?  Yes    Pain Score  3     Pain Location  Back    Pain Orientation  Right;Left    Pain Descriptors / Indicators  Aching;Sore    Pain Type  Chronic pain                       [facility] Adult PT Treatment/Exercise - 01/25/18 0001      Lumbar Exercises: Stretches   Active Hamstring Stretch  1 rep;30 seconds;Left;Right PNF hold relax technique    Single Knee to Chest Stretch  2 reps;30 seconds;Right;Left    Lower Trunk Rotation  -- 10 reps with red physioball    Hip Flexor Stretch  1 rep;Right;Left;30 seconds standing at chair      Lumbar Exercises: Aerobic   Nustep  L5 x 5  min LE only       Lumbar Exercises: Supine   Bent Knee Raise  10 reps with pelvic tilt    Bridge with Ball Squeeze  10 reps cues for TA contraction      Lumbar Exercises: Sidelying   Clam  Both;10 reps cues to keep hips rolled forward          Balance Exercises - 01/25/18 1427      Balance Exercises: Standing   Standing Eyes Closed  2 reps;30 secs;Solid surface pt reports he feels wobbly        PT Education - 01/25/18 1418    Education Details  HEP and location of performing exercises at home (bed vs couch)    Person(s) Educated  Patient    Methods  Explanation    Comprehension  Verbalized understanding       PT Short Term Goals - 01/11/18 1751      PT SHORT TERM GOAL #1   Title  Pt will be independent with HEP    Baseline  Basic flexibility addressed    Time  3    Period  Weeks    Status  On-going      PT SHORT TERM GOAL #2   Title  Pt will demonstrate proper posture  in sitting and standing without VC greater than 75% of the time to reduce back pain and abnormal muscle tension.    Time  3    Period  Weeks    Status  On-going        PT Long Term Goals - 12/29/17 1535      PT LONG TERM GOAL #1   Title  Pt will demonstrate increased lumbar extension AROM to >/= 20 degrees to improve functional mobility and decrease pain    Time  6    Period  Weeks    Status  New    Target Date  02/09/18      PT LONG TERM GOAL #2   Title  Pt will improve overall hip strength to >/= 4/5 MMT strength to improve functional mobility and decrease back pain    Time  6    Period  Weeks    Status  New    Target Date  02/09/18      PT LONG TERM GOAL #3   Title  Pt will stand for >/= 10 min with LRAD without an increase in pain to improve quality of life and functional mobility    Time  6    Period  Weeks    Status  New    Target Date  02/09/18      PT LONG TERM GOAL #4   Title  FOTO will improve from 56% limited to </= 36% limited to demonstrate improved quality of life and improved functional mobility.     Time  6    Period  Weeks    Status  New    Target Date  02/09/18            Plan - 01/25/18 1419    Clinical Impression Statement  Pt reporting some pain in his back today. Treatment today focused on stretching, strengthening of hip and core, and balance. Instructed pt to perform standing hip flexor stretch at home instread of supine hip flexor stretch due to pt's bed height at home. Pt states he is having no pain on the R in the back, but his pain has increased in his L back and hip. Overall,  he thinks physical therapy has been helping him.     PT Treatment/Interventions  Therapeutic exercise;Therapeutic activities;Functional mobility training;Gait training;Balance training;Patient/family education;Manual techniques;ADLs/Self Care Home Management;Cryotherapy;Electrical Stimulation;Iontophoresis 4mg /ml Dexamethasone;Moist Heat;Ultrasound;Dry needling;Taping     PT Next Visit Plan  review HEP and make sure pt is performing correctly as he thinks the exercises don't work as well when he does them at home, Nustep, progress hip strength, core to tolerance.  Parallel bars and mirror for balance, gait     PT Home Exercise Plan  hip flexor stretching, bridges, supine clamshells; lower trunk rotations, standing hip abduction    Consulted and Agree with Plan of Care  Patient       Patient will benefit from skilled therapeutic intervention in order to improve the following deficits and impairments:  Abnormal gait, Pain, Decreased mobility, Hypomobility, Postural dysfunction, Decreased activity tolerance, Decreased range of motion, Decreased strength, Decreased balance, Difficulty walking, Impaired flexibility, Increased fascial restricitons  Visit Diagnosis: Chronic midline low back pain without sciatica  Abnormal posture  Other abnormalities of gait and mobility  Muscle weakness (generalized)  Chronic midline low back pain with sciatica, sciatica laterality unspecified  Unsteadiness on feet     Problem List Patient Active Problem List   Diagnosis Date Noted  . Smoker   . Sleep apnea   . Shortness of breath   . Neuromuscular disorder ([place])   . Kidney stones   . Hypertension   . Family history of coronary artery disease   . Dyslipidemia   . Diverticulosis   . COPD (chronic obstructive pulmonary disease) ([place])   . BPH (benign prostatic hyperplasia)   . Dyspnea 04/29/2017  . Chest pain 03/23/2017  . Arthritis 12/21/2016  . Lung nodules 12/17/2016  . History of asbestos exposure 09/22/2016  . Chronic diastolic heart failure ([place]) 09/22/2016  . Chronic back pain 06/09/2016  . Congestive heart failure ([place]) 10/19/2014  . OSA on CPAP 10/18/2014  . Chronic obstructive pulmonary disease ([place]) 10/09/2014  . Leukocytosis 10/09/2014  . Chronic respiratory failure with hypoxia ([place]) 03/29/2014  . Essential hypertension 03/26/2011  . History  of nuclear stress test 03/20/2010   Worthy Flank, SPT 01/25/18 2:28 PM   [place] [facility] [address] [place], [place], [address] Phone: [phone]   Fax:  [phone]  Name: Curtis Riley MRN: [id number] Date of Birth: 07/28/1934

## 2018-01-27 ENCOUNTER — Encounter: Payer: Self-pay | Admitting: Physical Therapy

## 2018-01-27 ENCOUNTER — Ambulatory Visit: Payer: Medicare Other | Admitting: Physical Therapy

## 2018-01-27 DIAGNOSIS — M545 Low back pain: Secondary | ICD-10-CM | POA: Diagnosis not present

## 2018-01-27 DIAGNOSIS — M5440 Lumbago with sciatica, unspecified side: Secondary | ICD-10-CM | POA: Diagnosis not present

## 2018-01-27 DIAGNOSIS — R293 Abnormal posture: Secondary | ICD-10-CM

## 2018-01-27 DIAGNOSIS — M6281 Muscle weakness (generalized): Secondary | ICD-10-CM | POA: Diagnosis not present

## 2018-01-27 DIAGNOSIS — R2689 Other abnormalities of gait and mobility: Secondary | ICD-10-CM

## 2018-01-27 DIAGNOSIS — G8929 Other chronic pain: Secondary | ICD-10-CM

## 2018-01-27 NOTE — Therapy (Signed)
[place], [place], [address] Phone: [phone]   Fax:  [phone]  Physical Therapy Treatment  Patient Details  Name: Curtis Riley MRN: [id number] Date of Birth: 09/12/1934 Referring Provider: Jill Alexanders, MD   Encounter Date: 01/27/2018  PT End of Session - 01/27/18 1411    Visit Number  8    Number of Visits  13    Date for PT Re-Evaluation  02/09/18    Authorization Type  Medicare primary - Kx mod by 15th visit and progress note by 10th, [employer] secondary - PT only    PT Start Time  1331    PT Stop Time  1411    PT Time Calculation (min)  40 min    Activity Tolerance  Patient tolerated treatment well    Behavior During Therapy  [facility] for tasks assessed/performed       Past Medical History:  Diagnosis Date  . Angina    occasional chest pain  never been treated for this ...   . Arthritis   . BPH (benign prostatic hyperplasia)   . COPD (chronic obstructive pulmonary disease) ([place])   . CVA (cerebral infarction)   . Diverticulosis   . Dyslipidemia   . Family history of coronary artery disease    brothers, sisters  . History of nuclear stress test 03/2010   dobutamine; mild ischemia in mid inferior and apical inferior regions; ekg negative for ischemia; low risk scan   . Hypertension   . Kidney stones    10-15 yrs ago  . Neuromuscular disorder ([place])   . Shortness of breath    with exertion   . Sleep apnea    uses cpap  . Smoker     Past Surgical History:  Procedure Laterality Date  . ANTERIOR CERVICAL DECOMP/DISCECTOMY FUSION  10/06/2011   Procedure: ANTERIOR CERVICAL DECOMPRESSION/DISCECTOMY FUSION 3 LEVELS;  Surgeon: Faythe Ghee, MD;  Location: [facility];  Service: Neurosurgery;  Laterality: Bilateral;  Cervical three-four, four-five, five-six anterior cervical decompression, fusion  . CARDIAC CATHETERIZATION  10/2010   small bridging segment in LAD  . TONSILLECTOMY  age 67  .  TRANSTHORACIC ECHOCARDIOGRAM  2004   normal LV size and function, aortic root dilated  . TRANSURETHRAL RESECTION OF PROSTATE  08/1996  . TRANSURETHRAL RESECTION OF PROSTATE     long time ago per pt.  10-15 yrs    There were no vitals filed for this visit.  Subjective Assessment - 01/27/18 1333    Subjective  I've had good days and bad days, I have a hard time telling what helps me the most as its not consistent progress. This morning I didn't feel any pain at all, but by the time I was ready to leave the house the pain in my thighs came back. I do feel like I'm getting better in general.     Patient Stated Goals  decrease or eliminate pain    Currently in Pain?  Yes    Pain Score  6     Pain Location  Back    Pain Orientation  Right;Left    Pain Descriptors / Indicators  Aching;Sore    Pain Type  Chronic pain    Pain Radiating Towards  front of B thighs     Pain Onset  More than a month ago                       [facility]  Adult PT Treatment/Exercise - 01/27/18 0001      Lumbar Exercises: Stretches   Active Hamstring Stretch  Right;Left;2 reps;30 seconds    Single Knee to Chest Stretch  5 reps;10 seconds    Lower Trunk Rotation  5 reps;10 seconds    Piriformis Stretch  2 reps;30 seconds      Lumbar Exercises: Seated   Long Arc Quad on Chair  Both;1 set;10 reps;Other (comment) 4#     Other Seated Lumbar Exercises  attempted sit to stand, unable due to knee OA pain       Lumbar Exercises: Supine   Bridge  15 reps      Lumbar Exercises: Sidelying   Clam  Both;15 reps;Other (comment) red band              PT Education - 01/27/18 1411    Education Details  exercise form and purpose     Person(s) Educated  Patient    Methods  Explanation    Comprehension  Verbalized understanding       PT Short Term Goals - 01/11/18 1751      PT SHORT TERM GOAL #1   Title  Pt will be independent with HEP    Baseline  Basic flexibility addressed    Time  3    Period   Weeks    Status  On-going      PT SHORT TERM GOAL #2   Title  Pt will demonstrate proper posture in sitting and standing without VC greater than 75% of the time to reduce back pain and abnormal muscle tension.    Time  3    Period  Weeks    Status  On-going        PT Long Term Goals - 12/29/17 1535      PT LONG TERM GOAL #1   Title  Pt will demonstrate increased lumbar extension AROM to >/= 20 degrees to improve functional mobility and decrease pain    Time  6    Period  Weeks    Status  New    Target Date  02/09/18      PT LONG TERM GOAL #2   Title  Pt will improve overall hip strength to >/= 4/5 MMT strength to improve functional mobility and decrease back pain    Time  6    Period  Weeks    Status  New    Target Date  02/09/18      PT LONG TERM GOAL #3   Title  Pt will stand for >/= 10 min with LRAD without an increase in pain to improve quality of life and functional mobility    Time  6    Period  Weeks    Status  New    Target Date  02/09/18      PT LONG TERM GOAL #4   Title  FOTO will improve from 56% limited to </= 36% limited to demonstrate improved quality of life and improved functional mobility.     Time  6    Period  Weeks    Status  New    Target Date  02/09/18            Plan - 01/27/18 1412    Clinical Impression Statement  Patient arrives reporting ongoing fluctuating pain levels and he is unsure what helps the most, but does feel that he is improving with PT. Focused on lumbar mobility and flexibility initially, then progressed core/hip strength; offered  balance training however patient declines until his back is feeling better/less painful. Patient very pleasant and cooperative with PT today, remains motivated to continue with skilled therapy services moving forward.     Rehab Potential  Good    PT Frequency  2x / week    PT Duration  6 weeks    PT Treatment/Interventions  Therapeutic exercise;Therapeutic activities;Functional mobility  training;Gait training;Balance training;Patient/family education;Manual techniques;ADLs/Self Care Home Management;Cryotherapy;Electrical Stimulation;Iontophoresis 4mg /ml Dexamethasone;Moist Heat;Ultrasound;Dry needling;Taping    PT Next Visit Plan  review HEP and make sure pt is performing correctly as he thinks the exercises don't work as well when he does them at home, Nustep, progress hip strength, core to tolerance.  Parallel bars and mirror for balance, gait     PT Home Exercise Plan  hip flexor stretching, bridges, supine clamshells; lower trunk rotations, standing hip abduction    Consulted and Agree with Plan of Care  Patient       Patient will benefit from skilled therapeutic intervention in order to improve the following deficits and impairments:  Abnormal gait, Pain, Decreased mobility, Hypomobility, Postural dysfunction, Decreased activity tolerance, Decreased range of motion, Decreased strength, Decreased balance, Difficulty walking, Impaired flexibility, Increased fascial restricitons  Visit Diagnosis: Chronic midline low back pain without sciatica  Abnormal posture  Other abnormalities of gait and mobility  Muscle weakness (generalized)     Problem List Patient Active Problem List   Diagnosis Date Noted  . Smoker   . Sleep apnea   . Shortness of breath   . Neuromuscular disorder ([place])   . Kidney stones   . Hypertension   . Family history of coronary artery disease   . Dyslipidemia   . Diverticulosis   . COPD (chronic obstructive pulmonary disease) ([place])   . BPH (benign prostatic hyperplasia)   . Dyspnea 04/29/2017  . Chest pain 03/23/2017  . Arthritis 12/21/2016  . Lung nodules 12/17/2016  . History of asbestos exposure 09/22/2016  . Chronic diastolic heart failure ([place]) 09/22/2016  . Chronic back pain 06/09/2016  . Congestive heart failure ([place]) 10/19/2014  . OSA on CPAP 10/18/2014  . Chronic obstructive pulmonary disease ([place]) 10/09/2014  . Leukocytosis  10/09/2014  . Chronic respiratory failure with hypoxia ([place]) 03/29/2014  . Essential hypertension 03/26/2011  . History of nuclear stress test 03/20/2010    Deniece Ree PT, DPT, CBIS  Supplemental Physical Therapist [place]   Pager [facility] [address] [place], [place], [address] Phone: [phone]   Fax:  [phone]  Name: Curtis Riley MRN: [id number] Date of Birth: 1934-10-08

## 2018-02-01 ENCOUNTER — Ambulatory Visit: Payer: Medicare Other | Admitting: Physical Therapy

## 2018-02-01 DIAGNOSIS — M5440 Lumbago with sciatica, unspecified side: Secondary | ICD-10-CM

## 2018-02-01 DIAGNOSIS — R2681 Unsteadiness on feet: Secondary | ICD-10-CM

## 2018-02-01 DIAGNOSIS — R293 Abnormal posture: Secondary | ICD-10-CM | POA: Diagnosis not present

## 2018-02-01 DIAGNOSIS — G8929 Other chronic pain: Secondary | ICD-10-CM

## 2018-02-01 DIAGNOSIS — M6281 Muscle weakness (generalized): Secondary | ICD-10-CM

## 2018-02-01 DIAGNOSIS — R2689 Other abnormalities of gait and mobility: Secondary | ICD-10-CM

## 2018-02-01 DIAGNOSIS — M545 Low back pain: Secondary | ICD-10-CM | POA: Diagnosis not present

## 2018-02-01 NOTE — Therapy (Signed)
[place], [place], [address] Phone: [phone]   Fax:  [phone]  Physical Therapy Treatment  Patient Details  Name: Curtis Riley MRN: [id number] Date of Birth: 08/23/1934 Referring Provider: Jill Alexanders, MD   Encounter Date: 02/01/2018  PT End of Session - 02/01/18 1201    Visit Number  9    Number of Visits  13    Date for PT Re-Evaluation  02/09/18    Authorization Type  Medicare primary - Kx mod by 15th visit and progress note by 10th, [employer] secondary - PT only    PT Start Time  1148    PT Stop Time  1230    PT Time Calculation (min)  42 min    Activity Tolerance  Patient tolerated treatment well    Behavior During Therapy  [facility] for tasks assessed/performed       Past Medical History:  Diagnosis Date  . Angina    occasional chest pain  never been treated for this ...   . Arthritis   . BPH (benign prostatic hyperplasia)   . COPD (chronic obstructive pulmonary disease) ([place])   . CVA (cerebral infarction)   . Diverticulosis   . Dyslipidemia   . Family history of coronary artery disease    brothers, sisters  . History of nuclear stress test 03/2010   dobutamine; mild ischemia in mid inferior and apical inferior regions; ekg negative for ischemia; low risk scan   . Hypertension   . Kidney stones    10-15 yrs ago  . Neuromuscular disorder ([place])   . Shortness of breath    with exertion   . Sleep apnea    uses cpap  . Smoker     Past Surgical History:  Procedure Laterality Date  . ANTERIOR CERVICAL DECOMP/DISCECTOMY FUSION  10/06/2011   Procedure: ANTERIOR CERVICAL DECOMPRESSION/DISCECTOMY FUSION 3 LEVELS;  Surgeon: Faythe Ghee, MD;  Location: [facility];  Service: Neurosurgery;  Laterality: Bilateral;  Cervical three-four, four-five, five-six anterior cervical decompression, fusion  . CARDIAC CATHETERIZATION  10/2010   small bridging segment in LAD  . TONSILLECTOMY  age 20  .  TRANSTHORACIC ECHOCARDIOGRAM  2004   normal LV size and function, aortic root dilated  . TRANSURETHRAL RESECTION OF PROSTATE  08/1996  . TRANSURETHRAL RESECTION OF PROSTATE     long time ago per pt.  10-15 yrs    There were no vitals filed for this visit.  Subjective Assessment - 02/01/18 1149    Subjective  "I've been feeling pretty good the last 2-3 days. The pain in my back and my thighs has not been as frequent. Mainly my pain is in my left hip and left back. Sitting almost always relieves my pain to where it is nonexistent. The longer I stand the worse it gets."    Currently in Pain?  No/denies    Pain Score  0-No pain    Pain Onset  More than a month ago                       [facility] Adult PT Treatment/Exercise - 02/01/18 0001      Lumbar Exercises: Stretches   Hip Flexor Stretch  2 reps;30 seconds;Right;Left standing    Piriformis Stretch  2 reps;30 seconds      Lumbar Exercises: Seated   Long Arc Quad on Chair  Both;1 set;10 reps 4#    Other Seated Lumbar Exercises  seated  lumbar flexion with physioball rolls on table forward and laterally x 10 ea      Lumbar Exercises: Supine   Bridge  15 reps      Knee/Hip Exercises: Standing   Other Standing Knee Exercises  Physioball squeezes in standing for core activation 10 sec hold x 10               PT Short Term Goals - 01/11/18 1751      PT SHORT TERM GOAL #1   Title  Pt will be independent with HEP    Baseline  Basic flexibility addressed    Time  3    Period  Weeks    Status  On-going      PT SHORT TERM GOAL #2   Title  Pt will demonstrate proper posture in sitting and standing without VC greater than 75% of the time to reduce back pain and abnormal muscle tension.    Time  3    Period  Weeks    Status  On-going        PT Long Term Goals - 12/29/17 1535      PT LONG TERM GOAL #1   Title  Pt will demonstrate increased lumbar extension AROM to >/= 20 degrees to improve functional mobility  and decrease pain    Time  6    Period  Weeks    Status  New    Target Date  02/09/18      PT LONG TERM GOAL #2   Title  Pt will improve overall hip strength to >/= 4/5 MMT strength to improve functional mobility and decrease back pain    Time  6    Period  Weeks    Status  New    Target Date  02/09/18      PT LONG TERM GOAL #3   Title  Pt will stand for >/= 10 min with LRAD without an increase in pain to improve quality of life and functional mobility    Time  6    Period  Weeks    Status  New    Target Date  02/09/18      PT LONG TERM GOAL #4   Title  FOTO will improve from 56% limited to </= 36% limited to demonstrate improved quality of life and improved functional mobility.     Time  6    Period  Weeks    Status  New    Target Date  02/09/18            Plan - 02/01/18 1240    Clinical Impression Statement  Pt reports his back and thigh pain has been less frequent. He still reports pain in his L hip and back. Treatment today focused on stretching, core activation, and hip strengthening. Pt reports he had a slight increase in pain while performing standing exercises. He states that his pain is almost nonexistent when he is sitting and it worsens when he stands or walks for long periods of time.     PT Treatment/Interventions  Therapeutic exercise;Therapeutic activities;Functional mobility training;Gait training;Balance training;Patient/family education;Manual techniques;ADLs/Self Care Home Management;Cryotherapy;Electrical Stimulation;Iontophoresis 4mg /ml Dexamethasone;Moist Heat;Ultrasound;Dry needling;Taping    PT Next Visit Plan  review HEP, Nustep, progress hip strength, core to tolerance.  Parallel bars and mirror for balance, gait, soft tissue work to L glute med, TFL, and ITB    PT Home Exercise Plan  hip flexor stretching, bridges, supine clamshells; lower trunk rotations, standing hip abduction  Consulted and Agree with Plan of Care  Patient       Patient will  benefit from skilled therapeutic intervention in order to improve the following deficits and impairments:  Abnormal gait, Pain, Decreased mobility, Hypomobility, Postural dysfunction, Decreased activity tolerance, Decreased range of motion, Decreased strength, Decreased balance, Difficulty walking, Impaired flexibility, Increased fascial restricitons  Visit Diagnosis: Chronic midline low back pain without sciatica  Abnormal posture  Other abnormalities of gait and mobility  Muscle weakness (generalized)  Chronic midline low back pain with sciatica, sciatica laterality unspecified  Unsteadiness on feet     Problem List Patient Active Problem List   Diagnosis Date Noted  . Smoker   . Sleep apnea   . Shortness of breath   . Neuromuscular disorder ([place])   . Kidney stones   . Hypertension   . Family history of coronary artery disease   . Dyslipidemia   . Diverticulosis   . COPD (chronic obstructive pulmonary disease) ([place])   . BPH (benign prostatic hyperplasia)   . Dyspnea 04/29/2017  . Chest pain 03/23/2017  . Arthritis 12/21/2016  . Lung nodules 12/17/2016  . History of asbestos exposure 09/22/2016  . Chronic diastolic heart failure ([place]) 09/22/2016  . Chronic back pain 06/09/2016  . Congestive heart failure ([place]) 10/19/2014  . OSA on CPAP 10/18/2014  . Chronic obstructive pulmonary disease ([place]) 10/09/2014  . Leukocytosis 10/09/2014  . Chronic respiratory failure with hypoxia ([place]) 03/29/2014  . Essential hypertension 03/26/2011  . History of nuclear stress test 03/20/2010   Worthy Flank, SPT 02/01/18 12:45 PM   [place] [facility] [address] [place], [place], [address] Phone: [phone]   Fax:  [phone]  Name: RANDOLF SANSOUCIE MRN: [id number] Date of Birth: 11/08/1934

## 2018-02-03 ENCOUNTER — Encounter: Payer: Self-pay | Admitting: Physical Therapy

## 2018-02-03 ENCOUNTER — Ambulatory Visit: Payer: Medicare Other | Admitting: Physical Therapy

## 2018-02-03 DIAGNOSIS — R293 Abnormal posture: Secondary | ICD-10-CM | POA: Diagnosis not present

## 2018-02-03 DIAGNOSIS — G8929 Other chronic pain: Secondary | ICD-10-CM | POA: Diagnosis not present

## 2018-02-03 DIAGNOSIS — M545 Low back pain: Secondary | ICD-10-CM | POA: Diagnosis not present

## 2018-02-03 DIAGNOSIS — M6281 Muscle weakness (generalized): Secondary | ICD-10-CM

## 2018-02-03 DIAGNOSIS — R2681 Unsteadiness on feet: Secondary | ICD-10-CM

## 2018-02-03 DIAGNOSIS — R2689 Other abnormalities of gait and mobility: Secondary | ICD-10-CM

## 2018-02-03 DIAGNOSIS — M5440 Lumbago with sciatica, unspecified side: Secondary | ICD-10-CM | POA: Diagnosis not present

## 2018-02-03 NOTE — Therapy (Addendum)
[place], [place], [address] Phone: [phone]   Fax:  [phone]  Physical Therapy Treatment Progress Note Reporting Period 12/29/2017 to 02/03/2018  See note below for Objective Data and Assessment of Progress/Goals.      Patient Details  Name: Curtis Riley MRN: [id number] Date of Birth: 03/30/1935 Referring Provider: Jill Alexanders, MD   Encounter Date: 02/03/2018  PT End of Session - 02/03/18 1208    Visit Number  10    Number of Visits  13    Date for PT Re-Evaluation  02/17/18    Authorization Type  Medicare primary - Kx mod by 15th visit and progress note by 20th, [employer] secondary - PT only    PT Start Time  1100    PT Stop Time  1145    PT Time Calculation (min)  45 min    Activity Tolerance  Patient tolerated treatment well    Behavior During Therapy  [facility] for tasks assessed/performed       Past Medical History:  Diagnosis Date  . Angina    occasional chest pain  never been treated for this ...   . Arthritis   . BPH (benign prostatic hyperplasia)   . COPD (chronic obstructive pulmonary disease) ([place])   . CVA (cerebral infarction)   . Diverticulosis   . Dyslipidemia   . Family history of coronary artery disease    brothers, sisters  . History of nuclear stress test 03/2010   dobutamine; mild ischemia in mid inferior and apical inferior regions; ekg negative for ischemia; low risk scan   . Hypertension   . Kidney stones    10-15 yrs ago  . Neuromuscular disorder ([place])   . Shortness of breath    with exertion   . Sleep apnea    uses cpap  . Smoker     Past Surgical History:  Procedure Laterality Date  . ANTERIOR CERVICAL DECOMP/DISCECTOMY FUSION  10/06/2011   Procedure: ANTERIOR CERVICAL DECOMPRESSION/DISCECTOMY FUSION 3 LEVELS;  Surgeon: Faythe Ghee, MD;  Location: [facility];  Service: Neurosurgery;  Laterality: Bilateral;  Cervical three-four, four-five, five-six  anterior cervical decompression, fusion  . CARDIAC CATHETERIZATION  10/2010   small bridging segment in LAD  . TONSILLECTOMY  age 14  . TRANSTHORACIC ECHOCARDIOGRAM  2004   normal LV size and function, aortic root dilated  . TRANSURETHRAL RESECTION OF PROSTATE  08/1996  . TRANSURETHRAL RESECTION OF PROSTATE     long time ago per pt.  10-15 yrs    There were no vitals filed for this visit.  Subjective Assessment - 02/03/18 1101    Subjective  "I feel alright. I get down to a 0/10 sometimes."    Currently in Pain?  Yes    Pain Score  2     Pain Location  Back    Pain Orientation  Right;Left    Pain Descriptors / Indicators  Aching;Sore    Pain Onset  More than a month ago    Pain Frequency  Intermittent         [facility] PT Assessment - 02/03/18 0001      Observation/Other Assessments   Focus on Therapeutic Outcomes (FOTO)   53% limited (previously 56% limited on eval)      AROM   Lumbar Flexion  52 (started at 18 degrees of flexion)    Lumbar Extension  4 (started at 18 degrees of flexion)    Lumbar - Right Side  Bend  22    Lumbar - Left Side Bend  32      Strength   Right Hip Flexion  4+/5    Right Hip Extension  3-/5 assessed in supine due to inability to lay prone    Right Hip ABduction  2+/5 measured in side lying    Left Hip Flexion  4+/5    Left Hip Extension  3-/5 assessed in supine due to inability to lay prone    Left Hip ABduction  2+/5 measured in side lying    Right Knee Flexion  5/5    Right Knee Extension  5/5    Left Knee Flexion  5/5    Left Knee Extension  5/5                   [facility] Adult PT Treatment/Exercise - 02/03/18 0001      Lumbar Exercises: Stretches   Hip Flexor Stretch  2 reps;30 seconds;Left    Piriformis Stretch  2 reps;30 seconds;Left      Lumbar Exercises: Aerobic   Nustep  L5 x 5 min      Lumbar Exercises: Machines for Strengthening   Leg Press  2 x 10; 20lbs      Knee/Hip Exercises: Stretches   Hip Flexor Stretch  1  rep;60 seconds;Left      Knee/Hip Exercises: Aerobic   Nustep  L5 x 75min      Manual Therapy   Manual Therapy  Soft tissue mobilization    Soft tissue mobilization  muscle rolling of L glute med, ITB; rolling of hip flexors while stretching                PT Short Term Goals - 02/03/18 1128      PT SHORT TERM GOAL #1   Title  Pt will be independent with HEP    Status  On-going      PT SHORT TERM GOAL #2   Title  Pt will demonstrate proper posture in sitting and standing without VC greater than 75% of the time to reduce back pain and abnormal muscle tension.    Status  On-going        PT Long Term Goals - 02/03/18 1126      PT LONG TERM GOAL #1   Title  Pt will demonstrate increased lumbar extension AROM to >/= 20 degrees to improve functional mobility and decrease pain    Status  On-going      PT LONG TERM GOAL #2   Title  Pt will improve overall hip strength to >/= 4/5 MMT strength to improve functional mobility and decrease back pain    Status  On-going      PT LONG TERM GOAL #3   Title  Pt will stand for >/= 10 min with LRAD without an increase in pain to improve quality of life and functional mobility    Status  Achieved      PT LONG TERM GOAL #4   Title  FOTO will improve from 56% limited to </= 36% limited to demonstrate improved quality of life and improved functional mobility.     Status  Unable to assess            Plan - 02/03/18 1209    Clinical Impression Statement  Pt still reporting fluctuating back pain. Reassessment performed today. Pt still demonstrating decreased lumbar ROM, especially extension. Hip flexor strength has increased. Pt still lacks adequate hip abductor strength. Treatment focused  on glute strengthening and stretching of hip flexors in addition to STM of hip flexors, L glute med and ITB. Pt would benefit from continued OPPT services to address pain, ROM, strength, and safe functional mobility.     Rehab Potential  Good    PT  Frequency  2x / week    PT Duration  2 weeks    PT Treatment/Interventions  Therapeutic exercise;Therapeutic activities;Functional mobility training;Gait training;Balance training;Patient/family education;Manual techniques;ADLs/Self Care Home Management;Cryotherapy;Electrical Stimulation;Iontophoresis 4mg /ml Dexamethasone;Moist Heat;Ultrasound;Dry needling;Taping    PT Next Visit Plan  review HEP, Nustep, progress hip strength, core to tolerance.  Parallel bars and mirror for balance, gait, soft tissue work to L glute med, TFL, and ITB, leg press    PT Home Exercise Plan  hip flexor stretching, bridges, supine clamshells; lower trunk rotations, standing hip abduction    Consulted and Agree with Plan of Care  Patient       Patient will benefit from skilled therapeutic intervention in order to improve the following deficits and impairments:  Abnormal gait, Pain, Decreased mobility, Hypomobility, Postural dysfunction, Decreased activity tolerance, Decreased range of motion, Decreased strength, Decreased balance, Difficulty walking, Impaired flexibility, Increased fascial restricitons  Visit Diagnosis: Chronic midline low back pain without sciatica  Abnormal posture  Other abnormalities of gait and mobility  Muscle weakness (generalized)  Chronic midline low back pain with sciatica, sciatica laterality unspecified  Unsteadiness on feet     Problem List Patient Active Problem List   Diagnosis Date Noted  . Smoker   . Sleep apnea   . Shortness of breath   . Neuromuscular disorder ([place])   . Kidney stones   . Hypertension   . Family history of coronary artery disease   . Dyslipidemia   . Diverticulosis   . COPD (chronic obstructive pulmonary disease) ([place])   . BPH (benign prostatic hyperplasia)   . Dyspnea 04/29/2017  . Chest pain 03/23/2017  . Arthritis 12/21/2016  . Lung nodules 12/17/2016  . History of asbestos exposure 09/22/2016  . Chronic diastolic heart failure ([place])  09/22/2016  . Chronic back pain 06/09/2016  . Congestive heart failure ([place]) 10/19/2014  . OSA on CPAP 10/18/2014  . Chronic obstructive pulmonary disease ([place]) 10/09/2014  . Leukocytosis 10/09/2014  . Chronic respiratory failure with hypoxia ([place]) 03/29/2014  . Essential hypertension 03/26/2011  . History of nuclear stress test 03/20/2010   Worthy Flank, SPT 02/03/18 5:10 PM   [facility] [address] [place], [place], [address] Phone: [phone]   Fax:  [phone]  Name: ASHANTE SNELLING MRN: [id number] Date of Birth: 13-Sep-1934

## 2018-02-10 ENCOUNTER — Ambulatory Visit: Payer: Medicare Other | Admitting: Physical Therapy

## 2018-02-10 ENCOUNTER — Encounter: Payer: Self-pay | Admitting: Physical Therapy

## 2018-02-10 DIAGNOSIS — M6281 Muscle weakness (generalized): Secondary | ICD-10-CM | POA: Diagnosis not present

## 2018-02-10 DIAGNOSIS — G8929 Other chronic pain: Secondary | ICD-10-CM

## 2018-02-10 DIAGNOSIS — M545 Low back pain, unspecified: Secondary | ICD-10-CM

## 2018-02-10 DIAGNOSIS — R2681 Unsteadiness on feet: Secondary | ICD-10-CM

## 2018-02-10 DIAGNOSIS — R2689 Other abnormalities of gait and mobility: Secondary | ICD-10-CM

## 2018-02-10 DIAGNOSIS — M5440 Lumbago with sciatica, unspecified side: Secondary | ICD-10-CM | POA: Diagnosis not present

## 2018-02-10 DIAGNOSIS — R293 Abnormal posture: Secondary | ICD-10-CM | POA: Diagnosis not present

## 2018-02-10 NOTE — Therapy (Signed)
[place], [place], [address] Phone: [phone]   Fax:  [phone]  Physical Therapy Treatment  Patient Details  Name: Curtis Riley MRN: [id number] Date of Birth: 1934/11/14 Referring Provider: Jill Alexanders, MD   Encounter Date: 02/10/2018  PT End of Session - 02/10/18 1250    Visit Number  11    Number of Visits  13    Date for PT Re-Evaluation  02/17/18    Authorization Type  Medicare primary - Kx mod by 15th visit and progress note by 20th, [employer] secondary - PT only    PT Start Time  1145    PT Stop Time  1227    PT Time Calculation (min)  42 min    Activity Tolerance  Patient tolerated treatment well    Behavior During Therapy  [facility] for tasks assessed/performed       Past Medical History:  Diagnosis Date  . Angina    occasional chest pain  never been treated for this ...   . Arthritis   . BPH (benign prostatic hyperplasia)   . COPD (chronic obstructive pulmonary disease) ([place])   . CVA (cerebral infarction)   . Diverticulosis   . Dyslipidemia   . Family history of coronary artery disease    brothers, sisters  . History of nuclear stress test 03/2010   dobutamine; mild ischemia in mid inferior and apical inferior regions; ekg negative for ischemia; low risk scan   . Hypertension   . Kidney stones    10-15 yrs ago  . Neuromuscular disorder ([place])   . Shortness of breath    with exertion   . Sleep apnea    uses cpap  . Smoker     Past Surgical History:  Procedure Laterality Date  . ANTERIOR CERVICAL DECOMP/DISCECTOMY FUSION  10/06/2011   Procedure: ANTERIOR CERVICAL DECOMPRESSION/DISCECTOMY FUSION 3 LEVELS;  Surgeon: Faythe Ghee, MD;  Location: [facility];  Service: Neurosurgery;  Laterality: Bilateral;  Cervical three-four, four-five, five-six anterior cervical decompression, fusion  . CARDIAC CATHETERIZATION  10/2010   small bridging segment in LAD  . TONSILLECTOMY  age 83  .  TRANSTHORACIC ECHOCARDIOGRAM  2004   normal LV size and function, aortic root dilated  . TRANSURETHRAL RESECTION OF PROSTATE  08/1996  . TRANSURETHRAL RESECTION OF PROSTATE     long time ago per pt.  10-15 yrs    There were no vitals filed for this visit.  Subjective Assessment - 02/10/18 1148    Subjective  "I'm about the same. It hurts when I stand, but I feel better when I sit down. It is all in my L hip."    Currently in Pain?  Yes    Pain Score  2     Pain Location  Back    Pain Orientation  Left;Lower    Pain Descriptors / Indicators  Aching;Sore    Pain Type  Chronic pain    Pain Radiating Towards  none    Pain Onset  More than a month ago    Pain Frequency  Intermittent    Aggravating Factors   standing, walking    Pain Relieving Factors  sitting down, heating pad    Effect of Pain on Daily Activities  all activities affected                       [facility] Adult PT Treatment/Exercise - 02/10/18 0001  Ambulation/Gait   Gait Comments  gait training in parallell bars concentrating on heel to toe gait pattern; forwards and backwards walking x 2 noted trendelberg gait bilaterally      Lumbar Exercises: Stretches   Piriformis Stretch  2 reps;30 seconds;Right;Left      Knee/Hip Exercises: Aerobic   Nustep  L4 x 5 min      Knee/Hip Exercises: Standing   Hip Extension  AROM;Both;2 sets;10 reps leaning at counter for support    Other Standing Knee Exercises  sit to stand with yellow theraband around knees 2 x 5 cues to keep knees apart    Other Standing Knee Exercises  lateral stepping with yellow band around knees in the parallel bars 2 x 4  cues to stand up straight             PT Education - 02/10/18 1250    Education Details  gait training    Person(s) Educated  Patient    Methods  Explanation    Comprehension  Verbalized understanding       PT Short Term Goals - 02/03/18 1128      PT SHORT TERM GOAL #1   Title  Pt will be independent  with HEP    Status  On-going      PT SHORT TERM GOAL #2   Title  Pt will demonstrate proper posture in sitting and standing without VC greater than 75% of the time to reduce back pain and abnormal muscle tension.    Status  On-going        PT Long Term Goals - 02/03/18 1126      PT LONG TERM GOAL #1   Title  Pt will demonstrate increased lumbar extension AROM to >/= 20 degrees to improve functional mobility and decrease pain    Status  On-going      PT LONG TERM GOAL #2   Title  Pt will improve overall hip strength to >/= 4/5 MMT strength to improve functional mobility and decrease back pain    Status  On-going      PT LONG TERM GOAL #3   Title  Pt will stand for >/= 10 min with LRAD without an increase in pain to improve quality of life and functional mobility    Status  Achieved      PT LONG TERM GOAL #4   Title  FOTO will improve from 56% limited to </= 36% limited to demonstrate improved quality of life and improved functional mobility.     Status  Unable to assess            Plan - 02/10/18 1250    Clinical Impression Statement  Pt reports he still has pain in his L hip when he stands for too long and it goes away when he sits down. He does report the pain in his anterior thighs is gone. Treatment focused on strengthening oh hip musculature in standing. Pt requires rest breaks due to pain when standing for too long.     PT Treatment/Interventions  Therapeutic exercise;Therapeutic activities;Functional mobility training;Gait training;Balance training;Patient/family education;Manual techniques;ADLs/Self Care Home Management;Cryotherapy;Electrical Stimulation;Iontophoresis 4mg /ml Dexamethasone;Moist Heat;Ultrasound;Dry needling;Taping    PT Next Visit Plan  review HEP, Nustep, progress hip strength, core to tolerance.  Parallel bars and mirror for balance, gait, soft tissue work to L glute med, TFL, and ITB, leg press; hip strengthening in standing    PT Home Exercise Plan   hip flexor stretching, bridges, supine clamshells; lower trunk rotations,  standing hip abduction    Consulted and Agree with Plan of Care  Patient       Patient will benefit from skilled therapeutic intervention in order to improve the following deficits and impairments:  Abnormal gait, Pain, Decreased mobility, Hypomobility, Postural dysfunction, Decreased activity tolerance, Decreased range of motion, Decreased strength, Decreased balance, Difficulty walking, Impaired flexibility, Increased fascial restricitons  Visit Diagnosis: Chronic midline low back pain without sciatica  Abnormal posture  Other abnormalities of gait and mobility  Muscle weakness (generalized)  Chronic midline low back pain with sciatica, sciatica laterality unspecified  Unsteadiness on feet     Problem List Patient Active Problem List   Diagnosis Date Noted  . Smoker   . Sleep apnea   . Shortness of breath   . Neuromuscular disorder ([place])   . Kidney stones   . Hypertension   . Family history of coronary artery disease   . Dyslipidemia   . Diverticulosis   . COPD (chronic obstructive pulmonary disease) ([place])   . BPH (benign prostatic hyperplasia)   . Dyspnea 04/29/2017  . Chest pain 03/23/2017  . Arthritis 12/21/2016  . Lung nodules 12/17/2016  . History of asbestos exposure 09/22/2016  . Chronic diastolic heart failure ([place]) 09/22/2016  . Chronic back pain 06/09/2016  . Congestive heart failure ([place]) 10/19/2014  . OSA on CPAP 10/18/2014  . Chronic obstructive pulmonary disease ([place]) 10/09/2014  . Leukocytosis 10/09/2014  . Chronic respiratory failure with hypoxia ([place]) 03/29/2014  . Essential hypertension 03/26/2011  . History of nuclear stress test 03/20/2010   Worthy Flank, SPT 02/10/18 12:55 PM   [facility] [address] [place], [place], [address] Phone: [phone]   Fax:  [phone]  Name: Curtis Riley MRN:  [id number] Date of Birth: Nov 03, 1934

## 2018-02-14 ENCOUNTER — Encounter: Payer: Self-pay | Admitting: Internal Medicine

## 2018-02-14 ENCOUNTER — Ambulatory Visit (INDEPENDENT_AMBULATORY_CARE_PROVIDER_SITE_OTHER): Payer: Medicare Other | Admitting: Internal Medicine

## 2018-02-14 VITALS — BP 115/68 | HR 78 | Ht 75.0 in | Wt 251.0 lb

## 2018-02-14 DIAGNOSIS — J449 Chronic obstructive pulmonary disease, unspecified: Secondary | ICD-10-CM | POA: Diagnosis not present

## 2018-02-14 DIAGNOSIS — Z9989 Dependence on other enabling machines and devices: Secondary | ICD-10-CM | POA: Diagnosis not present

## 2018-02-14 DIAGNOSIS — I10 Essential (primary) hypertension: Secondary | ICD-10-CM

## 2018-02-14 DIAGNOSIS — G4733 Obstructive sleep apnea (adult) (pediatric): Secondary | ICD-10-CM | POA: Diagnosis not present

## 2018-02-14 NOTE — Patient Instructions (Signed)
Your physician wants you to follow-up in: 6 months with Dr. Hilty. You will receive a reminder letter in the mail two months in advance. If you don't receive a letter, please call our office to schedule the follow-up appointment.    

## 2018-02-14 NOTE — Progress Notes (Signed)
OFFICE NOTE  Chief Complaint:  Leg swelling  Primary Care Physician: Denita Lung, MD  HPI:  Curtis Riley is a 83 year old gentleman with a history of cervical neuropathy and recent C3-C4 surgery. He also has dyslipidemia, hypertension, sleep apnea, on CPAP, and mild COPD. He underwent catheterization, as you know, in 2012 for chest pain. It was negative except for a small bridging segment in the LAD. When I last saw him he had 2 episodes of chest discomfort and I prescribed some nitroglycerin. He reports that he has not needed to take that in the past 6 months. He has had some increase in mucus and secretions related to his COPD and shortness of breath. He feels that that could be better controlled and is planning to see his primary care provider about that.   This Curtis Riley returns today for followup. He is generally doing well. He denies any chest pain. He has had progressive shortness of breath and recently has been approved for oxygen. He plans to wear that 24/7. He does struggle with some lower extremity edema and wears compression stockings.  I saw Curtis Riley back in the office today. He reports that he's had some progressive shortness of breath again, despite using oxygen and CPAP. He's had some worsening leg swelling, more than typical in the past. He wears compression stockings, however has also noted some weight gain and mild orthopnea. I'm concerned that there may be some overlying right heart failure.  I saw Curtis Riley back in the office today. He reports an improvement in his breathing. There was some signs of mild congestive heart failure, possibly right heart failure. Increase his diuretics seem to have helped although he developed a pneumonia and was hospitalized. Subsequently was discharged on antibiotics which he's completed and a nebulizer. He was ordered have an echocardiogram however the LV was not well visualized due to his COPD, therefore no EF was provided. I  recommended a repeat limited echocardiogram with contrast however that was not yet obtained.   Curtis Riley returns today for follow-up. He reports no significant changes in his shortness of breath. He recently was admitted with a pneumonia and is recovered from that. Aced occasionally gets short-lived atypical chest pains. He did have chronic catheterization 2012 which showed no obstructive coronary disease.  12/06/2015  Curtis Riley returns for follow-up. He reports shortness of breath with minimal exertion however relates this to COPD. In fact she's lost about 6 pounds. There is no evidence that he's had any worsening diastolic heart failure. He continues on Lasix 40 mg daily. He's also noted some recent hearing loss in the right ear. Blood pressure is at goal today.  09/24/2016  Curtis Riley was again seen today in follow-up. Overall he continues to have shortness of breath with minimal exertion but he feels this is related to COPD. He does continue to wear oxygen at night. He tells me today that he does have a history of his spasticity exposure in the past and wonders if he could have lung disease related to this or whether it was related to smoking. I suspect this both. He does want to get screened to make sure he doesn't have any lung cancer such as mesothelioma or other findings. He is interested in a pulmonary referral which will place today per his request. He reports recently he's had more swelling than usual. He has been taking 80 mg of Lasix alternating with 40 mg of Lasix. He asked to be easier  to take 60 mg daily.  03/23/2017  Curtis Riley returns a for follow-up. He reports she's having some new left chest pain and left arm aching. These seem to be new symptoms over the past 6 months. Is not necessarily noting that worse with exertion or being relieved by rest. There were reports some stable dyspnea. He was put on another inhaler by his pulmonologist but he says is not necessarily helped or  improved his breathing. He mostly notes the chest discomfort at night. He says he wakes up in the middle the night with chest discomfort. He does wear CPAP and has been compliant with this. He had left heart catheterization in 10/2010 by Dr. Rex Kras which showed no occlusive coronary artery disease, but has not had ischemia testing since then. Unfortunately has low back pain and walks with a cane. He'll be unable to exercise on a treadmill.  04/29/2017  Curtis Riley was seen today in follow-up. He underwent nuclear stress testing for chest pain which she says is improved however he's had worsening shortness of breath. He's also had worsening lower extremity edema. Stress test was negative for ischemia however showed an EF of 49%. I suspect this is a gating abnormality. His shortness of breath may be worsening pulmonary disease. He was previously placed on Symbicort but discontinued it. He's using a Combivent inhaler but very frequently. He has not returned to see pulmonary and his pulmonologist has since left the practice. I suspect the EF may be higher than the gated EF on his nuclear stress test. He may benefit from some more Lasix and we discussed increasing that today.   08/02/2017  Curtis Riley reports his breathing has been stable. He saw his pulmonologist in the Fall - they recommended a new inhaler, but he feels stable on Symbicort. The increase in his diuretics have helped his edema, but not his breathing. He denies chest pain and had a low risk myoview in 03/2017.  02/14/2018  Curtis Riley returns today for follow-up.  He denies any chest pain or worsening shortness of breath.  He still has swelling in his legs although we increased his Lasix.  Most of this is likely related neuropathy.  He has significant back problems.  He is wearing compression stockings.  He denies any chest pain.  Blood pressure is well controlled today.  PMHx:  Past Medical History:  Diagnosis Date  . Angina    occasional  chest pain  never been treated for this ...   . Arthritis   . BPH (benign prostatic hyperplasia)   . COPD (chronic obstructive pulmonary disease) ([place])   . CVA (cerebral infarction)   . Diverticulosis   . Dyslipidemia   . Family history of coronary artery disease    brothers, sisters  . History of nuclear stress test 03/2010   dobutamine; mild ischemia in mid inferior and apical inferior regions; ekg negative for ischemia; low risk scan   . Hypertension   . Kidney stones    10-15 yrs ago  . Neuromuscular disorder ([place])   . Shortness of breath    with exertion   . Sleep apnea    uses cpap  . Smoker     Past Surgical History:  Procedure Laterality Date  . ANTERIOR CERVICAL DECOMP/DISCECTOMY FUSION  10/06/2011   Procedure: ANTERIOR CERVICAL DECOMPRESSION/DISCECTOMY FUSION 3 LEVELS;  Surgeon: Faythe Ghee, MD;  Location: [facility];  Service: Neurosurgery;  Laterality: Bilateral;  Cervical three-four, four-five, five-six anterior cervical decompression, fusion  .  CARDIAC CATHETERIZATION  10/2010   small bridging segment in LAD  . TONSILLECTOMY  age 49  . TRANSTHORACIC ECHOCARDIOGRAM  2004   normal LV size and function, aortic root dilated  . TRANSURETHRAL RESECTION OF PROSTATE  08/1996  . TRANSURETHRAL RESECTION OF PROSTATE     long time ago per pt.  10-15 yrs    FAMHx:  Family History  Problem Relation Age of Onset  . Arthritis Mother   . Hypertension Mother   . Arthritis Father   . Hypertension Father   . Arthritis Sister   . Diabetes Sister   . Hypertension Sister   . Arthritis Brother   . Hypertension Brother     SOCHx:   reports that he quit smoking about 19 years ago. His smoking use included cigarettes. He has a 40.00 pack-year smoking history. He has never used smokeless tobacco. He reports that he does not drink alcohol or use drugs.  ALLERGIES:  Allergies  Allergen Reactions  . Penicillins Anaphylaxis    ROS: Pertinent items noted in HPI and  remainder of comprehensive ROS otherwise negative.  HOME MEDS: Current Outpatient Medications  Medication Sig Dispense Refill  . aspirin 81 MG tablet Take 81 mg by mouth daily.    . carvedilol (COREG) 25 MG tablet TAKE 1 TABLET TWICE A DAY 180 tablet 3  . COMBIVENT RESPIMAT 20-100 MCG/ACT AERS respimat USE 1 INHALATION EVERY 6 HOURS 12 g 3  . furosemide (LASIX) 40 MG tablet TAKE ONE AND ONE-HALF TABLETS DAILY 135 tablet 1  . Green Tea, Camillia sinensis, (GREEN TEA PO) Take 1 tablet by mouth daily.    Marland Kitchen ibuprofen (ADVIL,MOTRIN) 200 MG tablet Take by mouth every 6 (six) hours as needed for mild pain.     Marland Kitchen ipratropium-albuterol (DUONEB) 0.5-2.5 (3) MG/3ML SOLN INHALE 1 VIAL VIA NEBULIZER EVERY 4 HOURS AS NEEDED 1080 mL 3  . KLOR-CON M10 10 MEQ tablet TAKE 1 TABLET DAILY 90 tablet 3  . lisinopril (PRINIVIL,ZESTRIL) 40 MG tablet TAKE 1 TABLET DAILY 90 tablet 2  . nitroGLYCERIN (NITROSTAT) 0.4 MG SL tablet Place 1 tablet (0.4 mg total) under the tongue every 5 (five) minutes as needed for chest pain. 25 tablet 3  . NON FORMULARY at bedtime. CPAP    . OXYGEN Inhale into the lungs. Uses Oxygen at night with CPAP    . terbinafine (LAMISIL) 250 MG tablet Take 1 tablet (250 mg total) daily by mouth. 30 tablet 1   No current facility-administered medications for this visit.     LABS/IMAGING: No results found for this or any previous visit (from the past 48 hour(s)). No results found.  VITALS: BP 115/68   Pulse 78   Ht 6\' 3"  (1.905 m)   Wt 251 lb (113.9 kg)   BMI 31.37 kg/m   EXAM: General appearance: alert and no distress Lungs: diminished breath sounds bilaterally Heart: regular rate and rhythm, S1, S2 normal, no murmur, click, rub or gallop Extremities: extremities normal, atraumatic, no cyanosis or edema Neurologic: Grossly normal  EKG: Sinus rhythm at 78, low voltage QRS-personally reviewed  ASSESSMENT: 1. Left chest and arm pain-no significant obstructive coronary disease by  cath 2012 - low risk myoview, EF 49% (03/2017) 2. Hypertension-controlled 3. Dyslipidemia 4. COPD - now on oxygen 5. OSA on CPAP 6. History of asbestos exposure  PLAN: 1.   Mr. Freilich denies any worsening shortness of breath.  He has chronic edema which seems to improve with elevating his feet and compression  stockings.  The diuretics have helped somewhat.  His weight is down about 6 or 7 pounds but he says he is eating less.  He has COPD and uses oxygen.  Last EF was 49% in September with a low risk Myoview.  No changes in his medicines today.  Follow-up with me 6 months.  Pixie Casino, MD, [facility], [place] Director of the Advanced Lipid Disorders &  Cardiovascular Risk Reduction Clinic Diplomate of the American Board of Clinical Lipidology Attending Cardiologist  Direct Dial: [phone]  Fax: [phone]  Website:  [email or web address] 02/14/2018, 2:49 PM

## 2018-02-21 ENCOUNTER — Ambulatory Visit: Payer: Medicare Other | Attending: Family Medicine | Admitting: Physical Therapy

## 2018-02-21 ENCOUNTER — Encounter: Payer: Self-pay | Admitting: Physical Therapy

## 2018-02-21 DIAGNOSIS — M5440 Lumbago with sciatica, unspecified side: Secondary | ICD-10-CM | POA: Insufficient documentation

## 2018-02-21 DIAGNOSIS — R293 Abnormal posture: Secondary | ICD-10-CM | POA: Diagnosis not present

## 2018-02-21 DIAGNOSIS — G8929 Other chronic pain: Secondary | ICD-10-CM | POA: Diagnosis not present

## 2018-02-21 DIAGNOSIS — R2681 Unsteadiness on feet: Secondary | ICD-10-CM | POA: Insufficient documentation

## 2018-02-21 DIAGNOSIS — R2689 Other abnormalities of gait and mobility: Secondary | ICD-10-CM | POA: Insufficient documentation

## 2018-02-21 DIAGNOSIS — M6281 Muscle weakness (generalized): Secondary | ICD-10-CM | POA: Diagnosis not present

## 2018-02-21 DIAGNOSIS — M545 Low back pain: Secondary | ICD-10-CM | POA: Insufficient documentation

## 2018-02-21 NOTE — Therapy (Addendum)
[place], [place], [address] Phone: [phone]   Fax:  [phone]  Physical Therapy Treatment/Renewal   Patient Details  Name: Curtis Riley MRN: [id number] Date of Birth: 1935-06-17 Referring Provider: Jill Alexanders, MD   Encounter Date: 02/21/2018  PT End of Session - 02/21/18 1423    Visit Number  12    Number of Visits  20    Date for PT Re-Evaluation  03/21/18    Authorization Type  Medicare primary - Kx mod by 15th visit and progress note by 20th, [employer] secondary - PT only    PT Start Time  1419    PT Stop Time  1505    PT Time Calculation (min)  46 min    Activity Tolerance  Patient tolerated treatment well    Behavior During Therapy  [facility] for tasks assessed/performed       Past Medical History:  Diagnosis Date  . Angina    occasional chest pain  never been treated for this ...   . Arthritis   . BPH (benign prostatic hyperplasia)   . COPD (chronic obstructive pulmonary disease) ([place])   . CVA (cerebral infarction)   . Diverticulosis   . Dyslipidemia   . Family history of coronary artery disease    brothers, sisters  . History of nuclear stress test 03/2010   dobutamine; mild ischemia in mid inferior and apical inferior regions; ekg negative for ischemia; low risk scan   . Hypertension   . Kidney stones    10-15 yrs ago  . Neuromuscular disorder ([place])   . Shortness of breath    with exertion   . Sleep apnea    uses cpap  . Smoker     Past Surgical History:  Procedure Laterality Date  . ANTERIOR CERVICAL DECOMP/DISCECTOMY FUSION  10/06/2011   Procedure: ANTERIOR CERVICAL DECOMPRESSION/DISCECTOMY FUSION 3 LEVELS;  Surgeon: Faythe Ghee, MD;  Location: [facility];  Service: Neurosurgery;  Laterality: Bilateral;  Cervical three-four, four-five, five-six anterior cervical decompression, fusion  . CARDIAC CATHETERIZATION  10/2010   small bridging segment in LAD  . TONSILLECTOMY   age 61  . TRANSTHORACIC ECHOCARDIOGRAM  2004   normal LV size and function, aortic root dilated  . TRANSURETHRAL RESECTION OF PROSTATE  08/1996  . TRANSURETHRAL RESECTION OF PROSTATE     long time ago per pt.  10-15 yrs    There were no vitals filed for this visit.  Subjective Assessment - 02/21/18 1458    Subjective  I am not where I need to be.  Some days I can do more than others, other times I have trouble getting out of the car.     How long can you walk comfortably?  10-15 min with brace and cane     Patient Stated Goals  decrease or eliminate pain    Currently in Pain?  Yes    Pain Score  2     Pain Location  Back    Pain Orientation  Left;Lower    Pain Descriptors / Indicators  Sore    Pain Type  Chronic pain    Pain Onset  More than a month ago    Pain Frequency  Intermittent    Aggravating Factors   standing, walking     Pain Relieving Factors  back brace, sitting, heat         [facility] PT Assessment - 02/21/18 0001      Observation/Other Assessments  Focus on Therapeutic Outcomes (FOTO)   NT, no change since last visit       AROM   Lumbar Flexion  WFL     Lumbar Extension  10 deg  starts about 5 deg from neutral       Strength   Right/Left Hip  -- did not test today, appears no more than 3/5    Right Hip Flexion  4+/5    Right Hip ABduction  3-/5    Left Hip Flexion  4+/5    Left Hip ABduction  3-/5    Right Knee Flexion  5/5    Right Knee Extension  5/5    Left Knee Flexion  5/5    Left Knee Extension  5/5         [facility] Adult PT Treatment/Exercise - 02/21/18 0001      Self-Care   Self-Care  Posture;Other Self-Care Comments    Posture  standing , back brace as treatment vs wearing all of the time     Other Self-Care Comments   traction, verbal review of HEP       Lumbar Exercises: Aerobic   Nustep  L7 UE and LE for 7 min        Mechanical lumbar traction Supine with PPT Min 60 lbs Max 80 lbs, increased to 90 lbs Hold 60 sec Rest 15 sec 15  min   No increased pain post traction       PT Education - 02/21/18 1457    Education Details  renewal, HEP, traction     Person(s) Educated  Patient    Methods  Explanation    Comprehension  Verbalized understanding       PT Short Term Goals - 02/21/18 1425      PT SHORT TERM GOAL #1   Title  Pt will be independent with HEP    Status  Achieved      PT SHORT TERM GOAL #2   Title  Pt will demonstrate proper posture in sitting and standing without VC greater than 75% of the time to reduce back pain and abnormal muscle tension.    Status  On-going        PT Long Term Goals - 02/21/18 1423      PT LONG TERM GOAL #1   Title  Pt will demonstrate increased lumbar extension AROM to >/= 20 degrees to improve functional mobility and decrease pain    Status  On-going      PT LONG TERM GOAL #2   Title  Pt will improve overall hip strength to >/= 4/5 MMT strength to improve functional mobility and decrease back pain    Baseline  3-/5    Status  On-going      PT LONG TERM GOAL #3   Title  Pt will stand for >/= 10 min with LRAD without an increase in pain to improve quality of life and functional mobility    Baseline  varies, but can do 10-15 min.  IF he starts WITH back pain he can only do < 45mn     Time  4    Period  Weeks    Status  Partially Met      PT LONG TERM GOAL #4   Title  FOTO will improve from 56% limited to </= 36% limited to demonstrate improved quality of life and improved functional mobility.     Baseline  53%    Status  On-going  PT LONG TERM GOAL #5   Title  "Demonstrate and verbalize techniques to reduce the risk of re-injury including: lifting, posture, body mechanics.     Status  Achieved            Plan - 02/21/18 1446    Clinical Impression Statement  Patient feels he has improved but he is still not where he wants to be.  He feels he can stand longer periods and can more upright for longer periods.  He continues to need cues for HEP  technique, posture.  He was inquiring about an inversion table and I recommended against it. I suggested we try mechanical traction today and include in new POC.       Clinical Presentation  Evolving    Clinical Decision Making  Moderate    Rehab Potential  Good    PT Frequency  2x / week    PT Duration  4 weeks    PT Treatment/Interventions  Therapeutic exercise;Therapeutic activities;Functional mobility training;Gait training;Balance training;Patient/family education;Manual techniques;ADLs/Self Care Home Management;Cryotherapy;Electrical Stimulation;Iontophoresis 19m/ml Dexamethasone;Moist Heat;Ultrasound;Dry needling;Taping    PT Next Visit Plan  how was traction? hip extension, glute med strength, posture, functional strength. Ant hip stretch    PT Home Exercise Plan  hip flexor stretching, bridges, supine clamshells; lower trunk rotations, standing hip abduction    Consulted and Agree with Plan of Care  Patient       Patient will benefit from skilled therapeutic intervention in order to improve the following deficits and impairments:  Abnormal gait, Pain, Decreased mobility, Hypomobility, Postural dysfunction, Decreased activity tolerance, Decreased range of motion, Decreased strength, Decreased balance, Difficulty walking, Impaired flexibility, Increased fascial restricitons  Visit Diagnosis: Chronic midline low back pain without sciatica  Abnormal posture  Other abnormalities of gait and mobility  Muscle weakness (generalized)  Chronic midline low back pain with sciatica, sciatica laterality unspecified  Unsteadiness on feet     Problem List Patient Active Problem List   Diagnosis Date Noted  . Smoker   . Sleep apnea   . Shortness of breath   . Neuromuscular disorder ([place]   . Kidney stones   . Hypertension   . Family history of coronary artery disease   . Dyslipidemia   . Diverticulosis   . COPD (chronic obstructive pulmonary disease) ([place]   . BPH (benign  prostatic hyperplasia)   . Dyspnea 04/29/2017  . Chest pain 03/23/2017  . Arthritis 12/21/2016  . Lung nodules 12/17/2016  . History of asbestos exposure 09/22/2016  . Chronic diastolic heart failure ([place] 09/22/2016  . Chronic back pain 06/09/2016  . Congestive heart failure ([place] 10/19/2014  . OSA on CPAP 10/18/2014  . Chronic obstructive pulmonary disease ([place] 10/09/2014  . Leukocytosis 10/09/2014  . Chronic respiratory failure with hypoxia ([place] 03/29/2014  . Essential hypertension 03/26/2011  . History of nuclear stress test 03/20/2010    [place] 02/21/2018, 3:03 PM  [facility] [place] [address]Phone: [phone]  Fax:  [phone] Name: JNYLEN CREQUEMRN: [id number] of Birth: 8December 17, 1936  JRaeford Razor PT 02/21/18 3:04 PM Phone: [phone]: [phone]

## 2018-02-23 ENCOUNTER — Encounter: Payer: Self-pay | Admitting: Physical Therapy

## 2018-02-23 ENCOUNTER — Ambulatory Visit: Payer: Medicare Other | Admitting: Physical Therapy

## 2018-02-23 DIAGNOSIS — R293 Abnormal posture: Secondary | ICD-10-CM | POA: Diagnosis not present

## 2018-02-23 DIAGNOSIS — M5440 Lumbago with sciatica, unspecified side: Secondary | ICD-10-CM

## 2018-02-23 DIAGNOSIS — R2681 Unsteadiness on feet: Secondary | ICD-10-CM

## 2018-02-23 DIAGNOSIS — R2689 Other abnormalities of gait and mobility: Secondary | ICD-10-CM | POA: Diagnosis not present

## 2018-02-23 DIAGNOSIS — M545 Low back pain, unspecified: Secondary | ICD-10-CM

## 2018-02-23 DIAGNOSIS — M6281 Muscle weakness (generalized): Secondary | ICD-10-CM

## 2018-02-23 DIAGNOSIS — G8929 Other chronic pain: Secondary | ICD-10-CM

## 2018-02-23 NOTE — Therapy (Signed)
[place], [place], [address] Phone: [phone]   Fax:  [phone]  Physical Therapy Treatment  Patient Details  Name: Curtis Riley MRN: [id number] Date of Birth: 24-Nov-1934 Referring Provider: Jill Alexanders, MD   Encounter Date: 02/23/2018  PT End of Session - 02/23/18 1215    Visit Number  13    Number of Visits  20    Date for PT Re-Evaluation  03/21/18    Authorization Type  Medicare primary - Kx mod by 15th visit and progress note by 20th, [employer] secondary - PT only    PT Start Time  1150 pt 5 min late    PT Stop Time  1249    PT Time Calculation (min)  59 min    Activity Tolerance  Patient tolerated treatment well    Behavior During Therapy  [facility] for tasks assessed/performed       Past Medical History:  Diagnosis Date  . Angina    occasional chest pain  never been treated for this ...   . Arthritis   . BPH (benign prostatic hyperplasia)   . COPD (chronic obstructive pulmonary disease) ([place])   . CVA (cerebral infarction)   . Diverticulosis   . Dyslipidemia   . Family history of coronary artery disease    brothers, sisters  . History of nuclear stress test 03/2010   dobutamine; mild ischemia in mid inferior and apical inferior regions; ekg negative for ischemia; low risk scan   . Hypertension   . Kidney stones    10-15 yrs ago  . Neuromuscular disorder ([place])   . Shortness of breath    with exertion   . Sleep apnea    uses cpap  . Smoker     Past Surgical History:  Procedure Laterality Date  . ANTERIOR CERVICAL DECOMP/DISCECTOMY FUSION  10/06/2011   Procedure: ANTERIOR CERVICAL DECOMPRESSION/DISCECTOMY FUSION 3 LEVELS;  Surgeon: Faythe Ghee, MD;  Location: [facility];  Service: Neurosurgery;  Laterality: Bilateral;  Cervical three-four, four-five, five-six anterior cervical decompression, fusion  . CARDIAC CATHETERIZATION  10/2010   small bridging segment in LAD  .  TONSILLECTOMY  age 83  . TRANSTHORACIC ECHOCARDIOGRAM  2004   normal LV size and function, aortic root dilated  . TRANSURETHRAL RESECTION OF PROSTATE  08/1996  . TRANSURETHRAL RESECTION OF PROSTATE     long time ago per pt.  10-15 yrs    There were no vitals filed for this visit.  Subjective Assessment - 02/23/18 1158    Subjective  "I feel good today. I did not have leg pain over the weekend, just in the small of my back. I think the traction helped the other day."    Currently in Pain?  Yes    Pain Score  2     Pain Location  Back    Pain Orientation  Left;Lower    Pain Descriptors / Indicators  Sore    Pain Type  Chronic pain    Pain Radiating Towards  none    Pain Onset  More than a month ago    Pain Frequency  Intermittent    Aggravating Factors   standing                       [facility] Adult PT Treatment/Exercise - 02/23/18 1200      Lumbar Exercises: Aerobic   Nustep  L5 UE and LE      Lumbar  Exercises: Seated   Sit to Stand  5 reps elevated table height; hands on knees      Lumbar Exercises: Sidelying   Clam  10 reps;Left;Right x 2 sets      Knee/Hip Exercises: Supine   Bridges  AROM;Strengthening;Both;2 sets;15 reps    Straight Leg Raises  AROM;Both;2 sets;10 reps      Modalities   Modalities  Traction      Traction   Type of Traction  Lumbar    Min (lbs)  80    Max (lbs)  100    Hold Time  60s    Rest Time  15s    Time  10 min               PT Short Term Goals - 02/21/18 1425      PT SHORT TERM GOAL #1   Title  Pt will be independent with HEP    Status  Achieved      PT SHORT TERM GOAL #2   Title  Pt will demonstrate proper posture in sitting and standing without VC greater than 75% of the time to reduce back pain and abnormal muscle tension.    Status  On-going        PT Long Term Goals - 02/21/18 1423      PT LONG TERM GOAL #1   Title  Pt will demonstrate increased lumbar extension AROM to >/= 20 degrees to improve  functional mobility and decrease pain    Status  On-going      PT LONG TERM GOAL #2   Title  Pt will improve overall hip strength to >/= 4/5 MMT strength to improve functional mobility and decrease back pain    Baseline  3-/5    Status  On-going      PT LONG TERM GOAL #3   Title  Pt will stand for >/= 10 min with LRAD without an increase in pain to improve quality of life and functional mobility    Baseline  varies, but can do 10-15 min.  IF he starts WITH back pain he can only do < 56mn     Time  4    Period  Weeks    Status  Partially Met      PT LONG TERM GOAL #4   Title  FOTO will improve from 56% limited to </= 36% limited to demonstrate improved quality of life and improved functional mobility.     Baseline  53%    Status  On-going      PT LONG TERM GOAL #5   Title  "Demonstrate and verbalize techniques to reduce the risk of re-injury including: lifting, posture, body mechanics.     Status  Achieved            Plan - 02/23/18 1240    Clinical Impression Statement  Pt reports his pain fluctuates between 0-4. Treatment focused on core and hip strengthening. Pt feels like he is stronger can stand up straighter than when he started physical therapy. Pt liked traction last session so performed again today. Pt feels minimal pain after completion of traction.     PT Treatment/Interventions  Therapeutic exercise;Therapeutic activities;Functional mobility training;Gait training;Balance training;Patient/family education;Manual techniques;ADLs/Self Care Home Management;Cryotherapy;Electrical Stimulation;Iontophoresis 447mml Dexamethasone;Moist Heat;Ultrasound;Dry needling;Taping    PT Next Visit Plan  how was traction? hip extension, glute med strength, posture, functional strength. Ant hip stretch    PT Home Exercise Plan  hip flexor stretching, bridges, supine clamshells; lower  trunk rotations, standing hip abduction    Consulted and Agree with Plan of Care  Patient       Patient  will benefit from skilled therapeutic intervention in order to improve the following deficits and impairments:  Abnormal gait, Pain, Decreased mobility, Hypomobility, Postural dysfunction, Decreased activity tolerance, Decreased range of motion, Decreased strength, Decreased balance, Difficulty walking, Impaired flexibility, Increased fascial restricitons  Visit Diagnosis: Chronic midline low back pain without sciatica  Abnormal posture  Other abnormalities of gait and mobility  Muscle weakness (generalized)  Chronic midline low back pain with sciatica, sciatica laterality unspecified  Unsteadiness on feet     Problem List Patient Active Problem List   Diagnosis Date Noted  . Smoker   . Sleep apnea   . Shortness of breath   . Neuromuscular disorder ([place])   . Kidney stones   . Hypertension   . Family history of coronary artery disease   . Dyslipidemia   . Diverticulosis   . COPD (chronic obstructive pulmonary disease) ([place])   . BPH (benign prostatic hyperplasia)   . Dyspnea 04/29/2017  . Chest pain 03/23/2017  . Arthritis 12/21/2016  . Lung nodules 12/17/2016  . History of asbestos exposure 09/22/2016  . Chronic diastolic heart failure ([place]) 09/22/2016  . Chronic back pain 06/09/2016  . Congestive heart failure ([place]) 10/19/2014  . OSA on CPAP 10/18/2014  . Chronic obstructive pulmonary disease ([place]) 10/09/2014  . Leukocytosis 10/09/2014  . Chronic respiratory failure with hypoxia ([place]) 03/29/2014  . Essential hypertension 03/26/2011  . History of nuclear stress test 03/20/2010    Karleen Dolphin 02/23/2018, 12:56 PM  [facility] [address] [place], [place], [address] Phone: [phone]   Fax:  [phone]  Name: Curtis Riley MRN: [id number] Date of Birth: 1935-03-29

## 2018-03-02 ENCOUNTER — Ambulatory Visit: Payer: Medicare Other

## 2018-03-02 DIAGNOSIS — R2689 Other abnormalities of gait and mobility: Secondary | ICD-10-CM | POA: Diagnosis not present

## 2018-03-02 DIAGNOSIS — M545 Low back pain: Secondary | ICD-10-CM | POA: Diagnosis not present

## 2018-03-02 DIAGNOSIS — G8929 Other chronic pain: Secondary | ICD-10-CM

## 2018-03-02 DIAGNOSIS — M5440 Lumbago with sciatica, unspecified side: Secondary | ICD-10-CM | POA: Diagnosis not present

## 2018-03-02 DIAGNOSIS — R293 Abnormal posture: Secondary | ICD-10-CM

## 2018-03-02 DIAGNOSIS — M6281 Muscle weakness (generalized): Secondary | ICD-10-CM | POA: Diagnosis not present

## 2018-03-02 NOTE — Therapy (Signed)
[place], [place], [address] Phone: [phone]   Fax:  [phone]  Physical Therapy Treatment  Patient Details  Name: Curtis Riley MRN: [id number] Date of Birth: July 24, 1934 Referring Provider: Jill Alexanders, MD   Encounter Date: 03/02/2018  PT End of Session - 03/02/18 0946    Visit Number  14    Number of Visits  20    Date for PT Re-Evaluation  03/21/18    Authorization Type  Medicare primary - Kx mod by 15th visit and progress note by 20th, [employer] secondary - PT only    PT Start Time  0939   8 min late   PT Stop Time  1025    PT Time Calculation (min)  46 min    Activity Tolerance  Patient tolerated treatment well    Behavior During Therapy  [facility] for tasks assessed/performed       Past Medical History:  Diagnosis Date  . Angina    occasional chest pain  never been treated for this ...   . Arthritis   . BPH (benign prostatic hyperplasia)   . COPD (chronic obstructive pulmonary disease) ([place])   . CVA (cerebral infarction)   . Diverticulosis   . Dyslipidemia   . Family history of coronary artery disease    brothers, sisters  . History of nuclear stress test 03/2010   dobutamine; mild ischemia in mid inferior and apical inferior regions; ekg negative for ischemia; low risk scan   . Hypertension   . Kidney stones    10-15 yrs ago  . Neuromuscular disorder ([place])   . Shortness of breath    with exertion   . Sleep apnea    uses cpap  . Smoker     Past Surgical History:  Procedure Laterality Date  . ANTERIOR CERVICAL DECOMP/DISCECTOMY FUSION  10/06/2011   Procedure: ANTERIOR CERVICAL DECOMPRESSION/DISCECTOMY FUSION 3 LEVELS;  Surgeon: Faythe Ghee, MD;  Location: [facility];  Service: Neurosurgery;  Laterality: Bilateral;  Cervical three-four, four-five, five-six anterior cervical decompression, fusion  . CARDIAC CATHETERIZATION  10/2010   small bridging segment in LAD  .  TONSILLECTOMY  age 82  . TRANSTHORACIC ECHOCARDIOGRAM  2004   normal LV size and function, aortic root dilated  . TRANSURETHRAL RESECTION OF PROSTATE  08/1996  . TRANSURETHRAL RESECTION OF PROSTATE     long time ago per pt.  10-15 yrs    There were no vitals filed for this visit.  Subjective Assessment - 03/02/18 0939    Subjective  Pain today 3/10 Varies . Traction helped.  Pain eased and most pof day had no pain.    Pain Score  3     Pain Location  Back    Pain Orientation  Left;Lower;Right    Pain Descriptors / Indicators  Sore    Pain Type  Chronic pain    Pain Radiating Towards  to LT hip    Pain Onset  More than a month ago    Pain Frequency  Intermittent    Aggravating Factors   standing    Pain Relieving Factors  brace , sitting, heat                       [facility] Adult PT Treatment/Exercise - 03/02/18 0001      Lumbar Exercises: Aerobic   Nustep  L6 LE only x 5 min      Lumbar Exercises: Seated  Sit to Stand  10 reps   hand on knees , table elevated 24 inches   Other Seated Lumbar Exercises  Clam x 20 red band      Knee/Hip Exercises: Supine   Bridges  Both;1 set;15 reps    Straight Leg Raises  Right;Left;10 reps      Traction   Type of Traction  Lumbar    Min (lbs)  80    Max (lbs)  100    Hold Time  60s    Rest Time  15s    Time  15               PT Short Term Goals - 02/21/18 1425      PT SHORT TERM GOAL #1   Title  Pt will be independent with HEP    Status  Achieved      PT SHORT TERM GOAL #2   Title  Pt will demonstrate proper posture in sitting and standing without VC greater than 75% of the time to reduce back pain and abnormal muscle tension.    Status  On-going        PT Long Term Goals - 02/21/18 1423      PT LONG TERM GOAL #1   Title  Pt will demonstrate increased lumbar extension AROM to >/= 20 degrees to improve functional mobility and decrease pain    Status  On-going      PT LONG TERM GOAL #2   Title  Pt  will improve overall hip strength to >/= 4/5 MMT strength to improve functional mobility and decrease back pain    Baseline  3-/5    Status  On-going      PT LONG TERM GOAL #3   Title  Pt will stand for >/= 10 min with LRAD without an increase in pain to improve quality of life and functional mobility    Baseline  varies, but can do 10-15 min.  IF he starts WITH back pain he can only do < 105mn     Time  4    Period  Weeks    Status  Partially Met      PT LONG TERM GOAL #4   Title  FOTO will improve from 56% limited to </= 36% limited to demonstrate improved quality of life and improved functional mobility.     Baseline  53%    Status  On-going      PT LONG TERM GOAL #5   Title  "Demonstrate and verbalize techniques to reduce the risk of re-injury including: lifting, posture, body mechanics.     Status  Achieved            Plan - 03/02/18 0939    Clinical Impression Statement  No complaints with exercies.   Traction still comfortable .   Pain reported still variable . Probably 2-3 more sessions to assess if he has long term benefitrs from traction .     PT Treatment/Interventions  Therapeutic exercise;Therapeutic activities;Functional mobility training;Gait training;Balance training;Patient/family education;Manual techniques;ADLs/Self Care Home Management;Cryotherapy;Electrical Stimulation;Iontophoresis 472mml Dexamethasone;Moist Heat;Ultrasound;Dry needling;Taping    PT Next Visit Plan  Continue traction , Continue exercises. Stretching    PT Home Exercise Plan  hip flexor stretching, bridges, supine clamshells; lower trunk rotations, standing hip abduction    Consulted and Agree with Plan of Care  Patient       Patient will benefit from skilled therapeutic intervention in order to improve the following deficits and impairments:  Abnormal  gait, Pain, Decreased mobility, Hypomobility, Postural dysfunction, Decreased activity tolerance, Decreased range of motion, Decreased strength,  Decreased balance, Difficulty walking, Impaired flexibility, Increased fascial restricitons  Visit Diagnosis: Chronic midline low back pain without sciatica  Abnormal posture  Other abnormalities of gait and mobility  Muscle weakness (generalized)     Problem List Patient Active Problem List   Diagnosis Date Noted  . Smoker   . Sleep apnea   . Shortness of breath   . Neuromuscular disorder ([place])   . Kidney stones   . Hypertension   . Family history of coronary artery disease   . Dyslipidemia   . Diverticulosis   . COPD (chronic obstructive pulmonary disease) ([place])   . BPH (benign prostatic hyperplasia)   . Dyspnea 04/29/2017  . Chest pain 03/23/2017  . Arthritis 12/21/2016  . Lung nodules 12/17/2016  . History of asbestos exposure 09/22/2016  . Chronic diastolic heart failure ([place]) 09/22/2016  . Chronic back pain 06/09/2016  . Congestive heart failure ([place]) 10/19/2014  . OSA on CPAP 10/18/2014  . Chronic obstructive pulmonary disease ([place]) 10/09/2014  . Leukocytosis 10/09/2014  . Chronic respiratory failure with hypoxia ([place]) 03/29/2014  . Essential hypertension 03/26/2011  . History of nuclear stress test 03/20/2010    Darrel Hoover  PT 03/02/2018, 10:22 AM  [facility] [address] [place], [place], [address] Phone: [phone]   Fax:  [phone]  Name: Curtis Riley MRN: [id number] Date of Birth: [birth year]

## 2018-03-06 ENCOUNTER — Other Ambulatory Visit: Payer: Self-pay | Admitting: Internal Medicine

## 2018-03-08 ENCOUNTER — Ambulatory Visit: Payer: Medicare Other | Admitting: Physical Therapy

## 2018-03-08 DIAGNOSIS — M545 Low back pain: Principal | ICD-10-CM

## 2018-03-08 DIAGNOSIS — M6281 Muscle weakness (generalized): Secondary | ICD-10-CM | POA: Diagnosis not present

## 2018-03-08 DIAGNOSIS — R2681 Unsteadiness on feet: Secondary | ICD-10-CM

## 2018-03-08 DIAGNOSIS — G8929 Other chronic pain: Secondary | ICD-10-CM | POA: Diagnosis not present

## 2018-03-08 DIAGNOSIS — R293 Abnormal posture: Secondary | ICD-10-CM

## 2018-03-08 DIAGNOSIS — R2689 Other abnormalities of gait and mobility: Secondary | ICD-10-CM | POA: Diagnosis not present

## 2018-03-08 DIAGNOSIS — M5440 Lumbago with sciatica, unspecified side: Secondary | ICD-10-CM | POA: Diagnosis not present

## 2018-03-08 NOTE — Therapy (Signed)
[place], [place], [address] Phone: [phone]   Fax:  [phone]  Physical Therapy Treatment  Patient Details  Name: Curtis Riley MRN: [id number] Date of Birth: [birth year] Referring Provider: Jill Alexanders, MD   Encounter Date: 03/08/2018  PT End of Session - 03/08/18 1105    Visit Number  15    Number of Visits  20    Date for PT Re-Evaluation  03/21/18    Authorization Type  Medicare primary - Kx mod by 15th visit and progress note by 20th, [employer] secondary - PT only    PT Start Time  1105    PT Stop Time  1152    PT Time Calculation (min)  47 min    Activity Tolerance  Patient tolerated treatment well       Past Medical History:  Diagnosis Date  . Angina    occasional chest pain  never been treated for this ...   . Arthritis   . BPH (benign prostatic hyperplasia)   . COPD (chronic obstructive pulmonary disease) ([place])   . CVA (cerebral infarction)   . Diverticulosis   . Dyslipidemia   . Family history of coronary artery disease    brothers, sisters  . History of nuclear stress test 03/2010   dobutamine; mild ischemia in mid inferior and apical inferior regions; ekg negative for ischemia; low risk scan   . Hypertension   . Kidney stones    10-15 yrs ago  . Neuromuscular disorder ([place])   . Shortness of breath    with exertion   . Sleep apnea    uses cpap  . Smoker     Past Surgical History:  Procedure Laterality Date  . ANTERIOR CERVICAL DECOMP/DISCECTOMY FUSION  10/06/2011   Procedure: ANTERIOR CERVICAL DECOMPRESSION/DISCECTOMY FUSION 3 LEVELS;  Surgeon: Faythe Ghee, MD;  Location: [facility];  Service: Neurosurgery;  Laterality: Bilateral;  Cervical three-four, four-five, five-six anterior cervical decompression, fusion  . CARDIAC CATHETERIZATION  10/2010   small bridging segment in LAD  . TONSILLECTOMY  age 83  . TRANSTHORACIC ECHOCARDIOGRAM  2004   normal LV size and  function, aortic root dilated  . TRANSURETHRAL RESECTION OF PROSTATE  08/1996  . TRANSURETHRAL RESECTION OF PROSTATE     long time ago per pt.  10-15 yrs    There were no vitals filed for this visit.  Subjective Assessment - 03/08/18 1105    Subjective  Pt reports he is having the same amount of relief after the traction about 1.5 days.  He is only tolerating about 10 min of standing still.  He is performing gastroc stretches and pelvic tillts regulary, and occassional standing hip ROM    Patient Stated Goals  decrease or eliminate pain    Currently in Pain?  No/denies   not at this time and not with his walk back.                       [facility] Adult PT Treatment/Exercise - 03/08/18 0001      Lumbar Exercises: Stretches   Active Hamstring Stretch  Left;Right;3 reps   45 sec   Hip Flexor Stretch  Left;Right;3 reps Attempted leg lengtheners in supine, pt didn't feel much stetching   45 sec seated off edge or chair     Lumbar Exercises: Supine   Bridge  15 reps;2 seconds      Modalities   Modalities  Traction  Traction   Type of Traction  Lumbar    Min (lbs)  80    Max (lbs)  110    Hold Time  60s    Rest Time  15s    Time  15             PT Education - 03/08/18 1115    Education Details  hip stretches    Person(s) Educated  Patient    Methods  Explanation;Demonstration;Handout    Comprehension  Returned demonstration;Verbalized understanding       PT Short Term Goals - 02/21/18 1425      PT SHORT TERM GOAL #1   Title  Pt will be independent with HEP    Status  Achieved      PT SHORT TERM GOAL #2   Title  Pt will demonstrate proper posture in sitting and standing without VC greater than 75% of the time to reduce back pain and abnormal muscle tension.    Status  On-going        PT Long Term Goals - 02/21/18 1423      PT LONG TERM GOAL #1   Title  Pt will demonstrate increased lumbar extension AROM to >/= 20 degrees to improve  functional mobility and decrease pain    Status  On-going      PT LONG TERM GOAL #2   Title  Pt will improve overall hip strength to >/= 4/5 MMT strength to improve functional mobility and decrease back pain    Baseline  3-/5    Status  On-going      PT LONG TERM GOAL #3   Title  Pt will stand for >/= 10 min with LRAD without an increase in pain to improve quality of life and functional mobility    Baseline  varies, but can do 10-15 min.  IF he starts WITH back pain he can only do < 7mn     Time  4    Period  Weeks    Status  Partially Met      PT LONG TERM GOAL #4   Title  FOTO will improve from 56% limited to </= 36% limited to demonstrate improved quality of life and improved functional mobility.     Baseline  53%    Status  On-going      PT LONG TERM GOAL #5   Title  "Demonstrate and verbalize techniques to reduce the risk of re-injury including: lifting, posture, body mechanics.     Status  Achieved            Plan - 03/08/18 1126    Clinical Impression Statement  JStephenreports he can tell he is getting stronger, the exercises are getting easier for him.  He is very tight in his hips and was able to return demo stretches for this while seated.  He reports he can have good compliance with these as he can perform them while watching TV.  No new goals met, Traction max pull was increased slightly today with good tolerance.     Rehab Potential  Good    PT Frequency  2x / week    PT Duration  4 weeks    PT Treatment/Interventions  Therapeutic exercise;Therapeutic activities;Functional mobility training;Gait training;Balance training;Patient/family education;Manual techniques;ADLs/Self Care Home Management;Cryotherapy;Electrical Stimulation;Iontophoresis 457mml Dexamethasone;Moist Heat;Ultrasound;Dry needling;Taping    PT Next Visit Plan  Continue traction , Continue exercises. Stretching    Consulted and Agree with Plan of Care  Patient  Patient will benefit from  skilled therapeutic intervention in order to improve the following deficits and impairments:  Abnormal gait, Pain, Decreased mobility, Hypomobility, Postural dysfunction, Decreased activity tolerance, Decreased range of motion, Decreased strength, Decreased balance, Difficulty walking, Impaired flexibility, Increased fascial restricitons  Visit Diagnosis: Chronic midline low back pain without sciatica  Abnormal posture  Other abnormalities of gait and mobility  Muscle weakness (generalized)  Chronic midline low back pain with sciatica, sciatica laterality unspecified  Unsteadiness on feet     Problem List Patient Active Problem List   Diagnosis Date Noted  . Smoker   . Sleep apnea   . Shortness of breath   . Neuromuscular disorder ([place])   . Kidney stones   . Hypertension   . Family history of coronary artery disease   . Dyslipidemia   . Diverticulosis   . COPD (chronic obstructive pulmonary disease) ([place])   . BPH (benign prostatic hyperplasia)   . Dyspnea 04/29/2017  . Chest pain 03/23/2017  . Arthritis 12/21/2016  . Lung nodules 12/17/2016  . History of asbestos exposure 09/22/2016  . Chronic diastolic heart failure ([place]) 09/22/2016  . Chronic back pain 06/09/2016  . Congestive heart failure ([place]) 10/19/2014  . OSA on CPAP 10/18/2014  . Chronic obstructive pulmonary disease ([place]) 10/09/2014  . Leukocytosis 10/09/2014  . Chronic respiratory failure with hypoxia ([place]) 03/29/2014  . Essential hypertension 03/26/2011  . History of nuclear stress test 03/20/2010    Jeral Pinch PT  03/08/2018, 11:34 AM  [facility] [address] [place], [place], [address] Phone: [phone]   Fax:  [phone]  Name: NOLEN LINDAMOOD MRN: [id number] Date of Birth: [birth year]

## 2018-03-10 ENCOUNTER — Ambulatory Visit: Payer: Medicare Other | Admitting: Physical Therapy

## 2018-03-10 ENCOUNTER — Encounter: Payer: Self-pay | Admitting: Physical Therapy

## 2018-03-10 DIAGNOSIS — R2689 Other abnormalities of gait and mobility: Secondary | ICD-10-CM

## 2018-03-10 DIAGNOSIS — M545 Low back pain: Secondary | ICD-10-CM | POA: Diagnosis not present

## 2018-03-10 DIAGNOSIS — M6281 Muscle weakness (generalized): Secondary | ICD-10-CM

## 2018-03-10 DIAGNOSIS — M5440 Lumbago with sciatica, unspecified side: Secondary | ICD-10-CM

## 2018-03-10 DIAGNOSIS — G8929 Other chronic pain: Secondary | ICD-10-CM

## 2018-03-10 DIAGNOSIS — R293 Abnormal posture: Secondary | ICD-10-CM

## 2018-03-10 DIAGNOSIS — R2681 Unsteadiness on feet: Secondary | ICD-10-CM

## 2018-03-10 NOTE — Therapy (Signed)
[place], [place], [address] Phone: [phone]   Fax:  [phone]  Physical Therapy Treatment  Patient Details  Name: Curtis Riley MRN: [id number] Date of Birth: [birth year] Referring Provider: Jill Alexanders, MD   Encounter Date: 03/10/2018  PT End of Session - 03/10/18 1149    Visit Number  16    Number of Visits  20    Date for PT Re-Evaluation  03/21/18    Authorization Type  Medicare primary - Kx mod by 15th visit and progress note by 20th, [employer] secondary - PT only    PT Start Time  1149    PT Stop Time  1233    PT Time Calculation (min)  44 min    Activity Tolerance  Patient tolerated treatment well       Past Medical History:  Diagnosis Date  . Angina    occasional chest pain  never been treated for this ...   . Arthritis   . BPH (benign prostatic hyperplasia)   . COPD (chronic obstructive pulmonary disease) ([place])   . CVA (cerebral infarction)   . Diverticulosis   . Dyslipidemia   . Family history of coronary artery disease    brothers, sisters  . History of nuclear stress test 03/2010   dobutamine; mild ischemia in mid inferior and apical inferior regions; ekg negative for ischemia; low risk scan   . Hypertension   . Kidney stones    10-15 yrs ago  . Neuromuscular disorder ([place])   . Shortness of breath    with exertion   . Sleep apnea    uses cpap  . Smoker     Past Surgical History:  Procedure Laterality Date  . ANTERIOR CERVICAL DECOMP/DISCECTOMY FUSION  10/06/2011   Procedure: ANTERIOR CERVICAL DECOMPRESSION/DISCECTOMY FUSION 3 LEVELS;  Surgeon: Faythe Ghee, MD;  Location: [facility];  Service: Neurosurgery;  Laterality: Bilateral;  Cervical three-four, four-five, five-six anterior cervical decompression, fusion  . CARDIAC CATHETERIZATION  10/2010   small bridging segment in LAD  . TONSILLECTOMY  age 83  . TRANSTHORACIC ECHOCARDIOGRAM  2004   normal LV size and  function, aortic root dilated  . TRANSURETHRAL RESECTION OF PROSTATE  08/1996  . TRANSURETHRAL RESECTION OF PROSTATE     long time ago per pt.  10-15 yrs    There were no vitals filed for this visit.  Subjective Assessment - 03/10/18 1153    Subjective  Jerek reports he was sore for a couple hours after the last treatment, and then has not had pain since.     Currently in Pain?  No/denies                       [facility] Adult PT Treatment/Exercise - 03/10/18 0001      Lumbar Exercises: Stretches   Hip Flexor Stretch  Left;Right;2 reps;20 seconds   standing in modified high lunge     Lumbar Exercises: Standing   Other Standing Lumbar Exercises  20 reps standing hip extensions, lifting them off bolster on wall , 10 reps attempted opposite arm/leg lifts away from wall, to difficulty for patients knees, performed arms only.       Lumbar Exercises: Seated   Other Seated Lumbar Exercises  clams with yellow band x 20 reps      Lumbar Exercises: Supine   Bridge  10 reps   with legs straight, feet on bolster     Knee/Hip  Exercises: Standing   Other Standing Knee Exercises  10 reps sit to/stand with yellow band around knees, no UE assist      Modalities   Modalities  Traction      Traction   Type of Traction  Lumbar    Min (lbs)  80    Max (lbs)  110    Hold Time  60s    Rest Time  15s    Time  15               PT Short Term Goals - 02/21/18 1425      PT SHORT TERM GOAL #1   Title  Pt will be independent with HEP    Status  Achieved      PT SHORT TERM GOAL #2   Title  Pt will demonstrate proper posture in sitting and standing without VC greater than 75% of the time to reduce back pain and abnormal muscle tension.    Status  On-going        PT Long Term Goals - 02/21/18 1423      PT LONG TERM GOAL #1   Title  Pt will demonstrate increased lumbar extension AROM to >/= 20 degrees to improve functional mobility and decrease pain    Status   On-going      PT LONG TERM GOAL #2   Title  Pt will improve overall hip strength to >/= 4/5 MMT strength to improve functional mobility and decrease back pain    Baseline  3-/5    Status  On-going      PT LONG TERM GOAL #3   Title  Pt will stand for >/= 10 min with LRAD without an increase in pain to improve quality of life and functional mobility    Baseline  varies, but can do 10-15 min.  IF he starts WITH back pain he can only do < 4mn     Time  4    Period  Weeks    Status  Partially Met      PT LONG TERM GOAL #4   Title  FOTO will improve from 56% limited to </= 36% limited to demonstrate improved quality of life and improved functional mobility.     Baseline  53%    Status  On-going      PT LONG TERM GOAL #5   Title  "Demonstrate and verbalize techniques to reduce the risk of re-injury including: lifting, posture, body mechanics.     Status  Achieved            Plan - 03/10/18 1220    Clinical Impression Statement  Pt reports no pain returning after his last visit.  He is very encouraged about this and is hoping he can now begin work on strengthening his legs and improving his balance.  Pain had been his biggest limiting factor.  He would benefit from a couple more traction visits to ensure continued relief and add in of lower body strengthening    Rehab Potential  Good    PT Frequency  2x / week    PT Duration  4 weeks    PT Treatment/Interventions  Therapeutic exercise;Therapeutic activities;Functional mobility training;Gait training;Balance training;Patient/family education;Manual techniques;ADLs/Self Care Home Management;Cryotherapy;Electrical Stimulation;Iontophoresis 453mml Dexamethasone;Moist Heat;Ultrasound;Dry needling;Taping    PT Next Visit Plan  Continue traction , Continue exercises. Stretching    Consulted and Agree with Plan of Care  Patient       Patient will benefit from skilled therapeutic  intervention in order to improve the following deficits and  impairments:  Abnormal gait, Pain, Decreased mobility, Hypomobility, Postural dysfunction, Decreased activity tolerance, Decreased range of motion, Decreased strength, Decreased balance, Difficulty walking, Impaired flexibility, Increased fascial restricitons  Visit Diagnosis: Chronic midline low back pain without sciatica  Abnormal posture  Other abnormalities of gait and mobility  Muscle weakness (generalized)  Chronic midline low back pain with sciatica, sciatica laterality unspecified  Unsteadiness on feet     Problem List Patient Active Problem List   Diagnosis Date Noted  . Smoker   . Sleep apnea   . Shortness of breath   . Neuromuscular disorder ([place])   . Kidney stones   . Hypertension   . Family history of coronary artery disease   . Dyslipidemia   . Diverticulosis   . COPD (chronic obstructive pulmonary disease) ([place])   . BPH (benign prostatic hyperplasia)   . Dyspnea 04/29/2017  . Chest pain 03/23/2017  . Arthritis 12/21/2016  . Lung nodules 12/17/2016  . History of asbestos exposure 09/22/2016  . Chronic diastolic heart failure ([place]) 09/22/2016  . Chronic back pain 06/09/2016  . Congestive heart failure ([place]) 10/19/2014  . OSA on CPAP 10/18/2014  . Chronic obstructive pulmonary disease ([place]) 10/09/2014  . Leukocytosis 10/09/2014  . Chronic respiratory failure with hypoxia ([place]) 03/29/2014  . Essential hypertension 03/26/2011  . History of nuclear stress test 03/20/2010    Jeral Pinch PT  03/10/2018, 12:22 PM  [facility] [address] [place], [place], [address] Phone: [phone]   Fax:  [phone]  Name: BRYNN REZNIK MRN: [id number] Date of Birth: 1935-01-09

## 2018-03-15 ENCOUNTER — Ambulatory Visit: Payer: Medicare Other | Admitting: Physical Therapy

## 2018-03-15 ENCOUNTER — Encounter: Payer: Self-pay | Admitting: Physical Therapy

## 2018-03-15 DIAGNOSIS — M6281 Muscle weakness (generalized): Secondary | ICD-10-CM

## 2018-03-15 DIAGNOSIS — M545 Low back pain: Principal | ICD-10-CM

## 2018-03-15 DIAGNOSIS — M5440 Lumbago with sciatica, unspecified side: Secondary | ICD-10-CM | POA: Diagnosis not present

## 2018-03-15 DIAGNOSIS — G8929 Other chronic pain: Secondary | ICD-10-CM

## 2018-03-15 DIAGNOSIS — R2689 Other abnormalities of gait and mobility: Secondary | ICD-10-CM | POA: Diagnosis not present

## 2018-03-15 DIAGNOSIS — R293 Abnormal posture: Secondary | ICD-10-CM | POA: Diagnosis not present

## 2018-03-15 NOTE — Therapy (Signed)
[place], [place], [address] Phone: [phone]   Fax:  [phone]  Physical Therapy Treatment  Patient Details  Name: Curtis Riley MRN: [id number] Date of Birth: [birth year] Referring Provider: Jill Alexanders, MD   Encounter Date: 03/15/2018  PT End of Session - 03/15/18 1148    Visit Number  17    Number of Visits  20    Date for PT Re-Evaluation  03/21/18    Authorization Type  Medicare primary - Kx mod by 15th visit and progress note by 20th, [employer] secondary - PT only    PT Start Time  1148    PT Stop Time  1241    PT Time Calculation (min)  53 min    Activity Tolerance  Patient tolerated treatment well    Behavior During Therapy  [facility] for tasks assessed/performed       Past Medical History:  Diagnosis Date  . Angina    occasional chest pain  never been treated for this ...   . Arthritis   . BPH (benign prostatic hyperplasia)   . COPD (chronic obstructive pulmonary disease) ([place])   . CVA (cerebral infarction)   . Diverticulosis   . Dyslipidemia   . Family history of coronary artery disease    brothers, sisters  . History of nuclear stress test 03/2010   dobutamine; mild ischemia in mid inferior and apical inferior regions; ekg negative for ischemia; low risk scan   . Hypertension   . Kidney stones    10-15 yrs ago  . Neuromuscular disorder ([place])   . Shortness of breath    with exertion   . Sleep apnea    uses cpap  . Smoker     Past Surgical History:  Procedure Laterality Date  . ANTERIOR CERVICAL DECOMP/DISCECTOMY FUSION  10/06/2011   Procedure: ANTERIOR CERVICAL DECOMPRESSION/DISCECTOMY FUSION 3 LEVELS;  Surgeon: Faythe Ghee, MD;  Location: [facility];  Service: Neurosurgery;  Laterality: Bilateral;  Cervical three-four, four-five, five-six anterior cervical decompression, fusion  . CARDIAC CATHETERIZATION  10/2010   small bridging segment in LAD  . TONSILLECTOMY  age 64  .  TRANSTHORACIC ECHOCARDIOGRAM  2004   normal LV size and function, aortic root dilated  . TRANSURETHRAL RESECTION OF PROSTATE  08/1996  . TRANSURETHRAL RESECTION OF PROSTATE     long time ago per pt.  10-15 yrs    There were no vitals filed for this visit.                    [place] Adult PT Treatment/Exercise - 03/15/18 1152      Lumbar Exercises: Stretches   Hip Flexor Stretch  Left;Right;2 reps;20 seconds   with glute set for added stretch   Quadruped Mid Back Stretch Limitations  performed in sitting with hands on rolling stool 2 x 30 sec      Lumbar Exercises: Aerobic   Nustep  L6 LE only x 7 min      Lumbar Exercises: Machines for Strengthening   Leg Press  2 x 12 20#, 2 x 12 303   ball between knees for proper form     Traction   Type of Traction  Lumbar    Min (lbs)  80    Max (lbs)  110    Hold Time  60s    Rest Time  15s    Time  15  Balance Exercises - 03/15/18 1234      Balance Exercises: Standing   Standing Eyes Opened  2 reps;30 secs;Narrow base of support (BOS);Wide (BOA)    Standing Eyes Closed  2 reps;30 secs;Narrow base of support (BOS);Wide Alen Blew)        PT Education - 03/15/18 1231    Education Details  updated HEP for corner balance exercise    Person(s) Educated  Patient    Methods  Explanation;Verbal cues;Handout    Comprehension  Verbalized understanding;Verbal cues required       PT Short Term Goals - 02/21/18 1425      PT SHORT TERM GOAL #1   Title  Pt will be independent with HEP    Status  Achieved      PT SHORT TERM GOAL #2   Title  Pt will demonstrate proper posture in sitting and standing without VC greater than 75% of the time to reduce back pain and abnormal muscle tension.    Status  On-going        PT Long Term Goals - 02/21/18 1423      PT LONG TERM GOAL #1   Title  Pt will demonstrate increased lumbar extension AROM to >/= 20 degrees to improve functional mobility and decrease pain     Status  On-going      PT LONG TERM GOAL #2   Title  Pt will improve overall hip strength to >/= 4/5 MMT strength to improve functional mobility and decrease back pain    Baseline  3-/5    Status  On-going      PT LONG TERM GOAL #3   Title  Pt will stand for >/= 10 min with LRAD without an increase in pain to improve quality of life and functional mobility    Baseline  varies, but can do 10-15 min.  IF he starts WITH back pain he can only do < 45mn     Time  4    Period  Weeks    Status  Partially Met      PT LONG TERM GOAL #4   Title  FOTO will improve from 56% limited to </= 36% limited to demonstrate improved quality of life and improved functional mobility.     Baseline  53%    Status  On-going      PT LONG TERM GOAL #5   Title  "Demonstrate and verbalize techniques to reduce the risk of re-injury including: lifting, posture, body mechanics.     Status  Achieved            Plan - 03/15/18 1232    Clinical Impression Statement  pt continues to report no pain and that he feels he is making progress with standing endurance. continued hip flexor stretching and strengthening which he performed well. began balance training and added corner balance for HEP. continued traction end of session which he continues to report relief of pain and tightness.     PT Treatment/Interventions  Therapeutic exercise;Therapeutic activities;Functional mobility training;Gait training;Balance training;Patient/family education;Manual techniques;ADLs/Self Care Home Management;Cryotherapy;Electrical Stimulation;Iontophoresis 464mml Dexamethasone;Moist Heat;Ultrasound;Dry needling;Taping    PT Next Visit Plan  Continue traction , Continue exercises. Stretching    PT Home Exercise Plan  hip flexor stretching, bridges, supine clamshells; lower trunk rotations, standing hip abduction    Consulted and Agree with Plan of Care  Patient       Patient will benefit from skilled therapeutic intervention in order to  improve the following deficits and impairments:  Abnormal gait, Pain, Decreased mobility, Hypomobility, Postural dysfunction, Decreased activity tolerance, Decreased range of motion, Decreased strength, Decreased balance, Difficulty walking, Impaired flexibility, Increased fascial restricitons  Visit Diagnosis: Chronic midline low back pain without sciatica  Abnormal posture  Other abnormalities of gait and mobility  Muscle weakness (generalized)  Chronic midline low back pain with sciatica, sciatica laterality unspecified     Problem List Patient Active Problem List   Diagnosis Date Noted  . Smoker   . Sleep apnea   . Shortness of breath   . Neuromuscular disorder ([facility])   . Kidney stones   . Hypertension   . Family history of coronary artery disease   . Dyslipidemia   . Diverticulosis   . COPD (chronic obstructive pulmonary disease) ([facility])   . BPH (benign prostatic hyperplasia)   . Dyspnea 04/29/2017  . Chest pain 03/23/2017  . Arthritis 12/21/2016  . Lung nodules 12/17/2016  . History of asbestos exposure 09/22/2016  . Chronic diastolic heart failure ([facility]) 09/22/2016  . Chronic back pain 06/09/2016  . Congestive heart failure ([facility]) 10/19/2014  . OSA on CPAP 10/18/2014  . Chronic obstructive pulmonary disease ([facility]) 10/09/2014  . Leukocytosis 10/09/2014  . Chronic respiratory failure with hypoxia ([facility]) 03/29/2014  . Essential hypertension 03/26/2011  . History of nuclear stress test 03/20/2010   Kristoffer Leamon PT, DPT, LAT, ATC  03/15/18  12:38 PM      [facility] [address] [place], [place], [address] Phone: [phone]   Fax:  [phone]  Name: Curtis Riley MRN: [id number] Date of Birth: 01/30/1935   

## 2018-03-17 ENCOUNTER — Encounter: Payer: Self-pay | Admitting: Physical Therapy

## 2018-03-17 ENCOUNTER — Ambulatory Visit: Payer: Medicare Other | Admitting: Physical Therapy

## 2018-03-17 DIAGNOSIS — M6281 Muscle weakness (generalized): Secondary | ICD-10-CM | POA: Diagnosis not present

## 2018-03-17 DIAGNOSIS — M545 Low back pain: Principal | ICD-10-CM

## 2018-03-17 DIAGNOSIS — R2681 Unsteadiness on feet: Secondary | ICD-10-CM

## 2018-03-17 DIAGNOSIS — R2689 Other abnormalities of gait and mobility: Secondary | ICD-10-CM | POA: Diagnosis not present

## 2018-03-17 DIAGNOSIS — M5440 Lumbago with sciatica, unspecified side: Secondary | ICD-10-CM | POA: Diagnosis not present

## 2018-03-17 DIAGNOSIS — G8929 Other chronic pain: Secondary | ICD-10-CM

## 2018-03-17 DIAGNOSIS — R293 Abnormal posture: Secondary | ICD-10-CM | POA: Diagnosis not present

## 2018-03-17 NOTE — Therapy (Signed)
[place], [place], [address] Phone: [phone]   Fax:  [phone]  Physical Therapy Treatment  Patient Details  Name: Curtis Riley MRN: [id number] Date of Birth: [birth year] Referring Provider: Jill Alexanders, MD   Encounter Date: 03/17/2018  PT End of Session - 03/17/18 1142    Visit Number  18    Number of Visits  20    Date for PT Re-Evaluation  03/21/18    Authorization Type  Medicare primary - Kx mod by 15th visit and progress note by 20th, [employer] secondary - PT only    PT Start Time  1101    PT Stop Time  1145    PT Time Calculation (min)  44 min    Activity Tolerance  Patient tolerated treatment well    Behavior During Therapy  [facility] for tasks assessed/performed       Past Medical History:  Diagnosis Date  . Angina    occasional chest pain  never been treated for this ...   . Arthritis   . BPH (benign prostatic hyperplasia)   . COPD (chronic obstructive pulmonary disease) ([place])   . CVA (cerebral infarction)   . Diverticulosis   . Dyslipidemia   . Family history of coronary artery disease    brothers, sisters  . History of nuclear stress test 03/2010   dobutamine; mild ischemia in mid inferior and apical inferior regions; ekg negative for ischemia; low risk scan   . Hypertension   . Kidney stones    10-15 yrs ago  . Neuromuscular disorder ([place])   . Shortness of breath    with exertion   . Sleep apnea    uses cpap  . Smoker     Past Surgical History:  Procedure Laterality Date  . ANTERIOR CERVICAL DECOMP/DISCECTOMY FUSION  10/06/2011   Procedure: ANTERIOR CERVICAL DECOMPRESSION/DISCECTOMY FUSION 3 LEVELS;  Surgeon: Faythe Ghee, MD;  Location: [facility];  Service: Neurosurgery;  Laterality: Bilateral;  Cervical three-four, four-five, five-six anterior cervical decompression, fusion  . CARDIAC CATHETERIZATION  10/2010   small bridging segment in LAD  . TONSILLECTOMY  age 82  .  TRANSTHORACIC ECHOCARDIOGRAM  2004   normal LV size and function, aortic root dilated  . TRANSURETHRAL RESECTION OF PROSTATE  08/1996  . TRANSURETHRAL RESECTION OF PROSTATE     long time ago per pt.  10-15 yrs    There were no vitals filed for this visit.  Subjective Assessment - 03/17/18 1103    Subjective  "I haven't had any pain for the last couple of days"     Currently in Pain?  No/denies    Aggravating Factors   prolonged walking / standing    Pain Relieving Factors  brace, sitting/ resting, traction                        [facility] Adult PT Treatment/Exercise - 03/17/18 0001      Exercises   Exercises  Knee/Hip      Lumbar Exercises: Stretches   Hip Flexor Stretch  Right;Left;2 reps;30 seconds   with added isometric glute contraction     Lumbar Exercises: Aerobic   Nustep  L6 LE only x 7 min   working on endurance     Knee/Hip Exercises: Standing   Hip Extension  Stengthening;Both;2 sets;10 reps;Knee straight   red theraband   Other Standing Knee Exercises  standing functional squats in 2 x 10, lateral  band walks  2 x 10   red theraband     Knee/Hip Exercises: Seated   Sit to Sand  1 set;10 reps;without UE support   from table using 2 inch step to assist with sit to stand     Knee/Hip Exercises: Supine   Bridges  2 sets;Both;15 reps;Strengthening   with added glute squeeze and ball between knees     Knee/Hip Exercises: Sidelying   Clams  bil 3 x 10 with green theraband around the knees          Balance Exercises - 03/17/18 1138      Balance Exercises: Standing   Standing Eyes Opened  30 secs;Narrow base of support (BOS);Wide (BOA);1 rep    Standing Eyes Closed  30 secs;Narrow base of support (BOS);Wide (BOA);1 rep          PT Short Term Goals - 02/21/18 1425      PT SHORT TERM GOAL #1   Title  Pt will be independent with HEP    Status  Achieved      PT SHORT TERM GOAL #2   Title  Pt will demonstrate proper posture in sitting and  standing without VC greater than 75% of the time to reduce back pain and abnormal muscle tension.    Status  On-going        PT Long Term Goals - 02/21/18 1423      PT LONG TERM GOAL #1   Title  Pt will demonstrate increased lumbar extension AROM to >/= 20 degrees to improve functional mobility and decrease pain    Status  On-going      PT LONG TERM GOAL #2   Title  Pt will improve overall hip strength to >/= 4/5 MMT strength to improve functional mobility and decrease back pain    Baseline  3-/5    Status  On-going      PT LONG TERM GOAL #3   Title  Pt will stand for >/= 10 min with LRAD without an increase in pain to improve quality of life and functional mobility    Baseline  varies, but can do 10-15 min.  IF he starts WITH back pain he can only do < 66mn     Time  4    Period  Weeks    Status  Partially Met      PT LONG TERM GOAL #4   Title  FOTO will improve from 56% limited to </= 36% limited to demonstrate improved quality of life and improved functional mobility.     Baseline  53%    Status  On-going      PT LONG TERM GOAL #5   Title  "Demonstrate and verbalize techniques to reduce the risk of re-injury including: lifting, posture, body mechanics.     Status  Achieved            Plan - 03/17/18 1143    Clinical Impression Statement  focused on hip/ knee strength today and opted to hold off on traction due to pt not having a traction machine. utilized strengthening exercises that can be performed at home. pt did fatigue today and had to halt balance due to pt fatigue and legs wanting to give out. end of session he reported fatigue but no pain.     PT Treatment/Interventions  Therapeutic exercise;Therapeutic activities;Functional mobility training;Gait training;Balance training;Patient/family education;Manual techniques;ADLs/Self Care Home Management;Cryotherapy;Electrical Stimulation;Iontophoresis 476mml Dexamethasone;Moist Heat;Ultrasound;Dry needling;Taping    PT  Next Visit Plan  Continue  traction , Continue exercises. Stretching    PT Home Exercise Plan  hip flexor stretching, bridges, supine clamshells; lower trunk rotations, standing hip abduction    Consulted and Agree with Plan of Care  Patient       Patient will benefit from skilled therapeutic intervention in order to improve the following deficits and impairments:     Visit Diagnosis: Chronic midline low back pain without sciatica  Abnormal posture  Other abnormalities of gait and mobility  Muscle weakness (generalized)  Chronic midline low back pain with sciatica, sciatica laterality unspecified  Unsteadiness on feet     Problem List Patient Active Problem List   Diagnosis Date Noted  . Smoker   . Sleep apnea   . Shortness of breath   . Neuromuscular disorder ([place])   . Kidney stones   . Hypertension   . Family history of coronary artery disease   . Dyslipidemia   . Diverticulosis   . COPD (chronic obstructive pulmonary disease) ([place])   . BPH (benign prostatic hyperplasia)   . Dyspnea 04/29/2017  . Chest pain 03/23/2017  . Arthritis 12/21/2016  . Lung nodules 12/17/2016  . History of asbestos exposure 09/22/2016  . Chronic diastolic heart failure ([place]) 09/22/2016  . Chronic back pain 06/09/2016  . Congestive heart failure ([place]) 10/19/2014  . OSA on CPAP 10/18/2014  . Chronic obstructive pulmonary disease ([place]) 10/09/2014  . Leukocytosis 10/09/2014  . Chronic respiratory failure with hypoxia ([place]) 03/29/2014  . Essential hypertension 03/26/2011  . History of nuclear stress test 03/20/2010   Starr Lake PT, DPT, LAT, ATC  03/17/18  11:45 AM      [place] [facility] [address] [place], [place], [address] Phone: [phone]   Fax:  [phone]  Name: ANDER WAMSER MRN: [id number] Date of Birth: Feb 28, 1935

## 2018-03-22 ENCOUNTER — Encounter: Payer: Self-pay | Admitting: Physical Therapy

## 2018-03-22 ENCOUNTER — Ambulatory Visit: Payer: Medicare Other | Attending: Family Medicine | Admitting: Physical Therapy

## 2018-03-22 DIAGNOSIS — G8929 Other chronic pain: Secondary | ICD-10-CM | POA: Diagnosis not present

## 2018-03-22 DIAGNOSIS — M6281 Muscle weakness (generalized): Secondary | ICD-10-CM | POA: Diagnosis not present

## 2018-03-22 DIAGNOSIS — R293 Abnormal posture: Secondary | ICD-10-CM | POA: Insufficient documentation

## 2018-03-22 DIAGNOSIS — R2689 Other abnormalities of gait and mobility: Secondary | ICD-10-CM | POA: Diagnosis not present

## 2018-03-22 DIAGNOSIS — M545 Low back pain: Secondary | ICD-10-CM | POA: Diagnosis not present

## 2018-03-22 NOTE — Therapy (Signed)
[place], [place], [address] Phone: [phone]   Fax:  [phone]  Physical Therapy Treatment / Discharge summary  Patient Details  Name: Curtis Riley MRN: [id number] Date of Birth: [birth year] Referring Provider: Jill Alexanders, MD   Encounter Date: 03/22/2018  PT End of Session - 03/22/18 1409    Visit Number  19    Number of Visits  20    Date for PT Re-Evaluation  03/22/18    Authorization Type  Medicare primary - Kx mod by 15th visit and progress note by 20th, [employer] secondary - PT only    PT Start Time  1411    PT Stop Time  1508    PT Time Calculation (min)  57 min    Activity Tolerance  Patient tolerated treatment well    Behavior During Therapy  [facility] for tasks assessed/performed       Past Medical History:  Diagnosis Date  . Angina    occasional chest pain  never been treated for this ...   . Arthritis   . BPH (benign prostatic hyperplasia)   . COPD (chronic obstructive pulmonary disease) ([place])   . CVA (cerebral infarction)   . Diverticulosis   . Dyslipidemia   . Family history of coronary artery disease    brothers, sisters  . History of nuclear stress test 03/2010   dobutamine; mild ischemia in mid inferior and apical inferior regions; ekg negative for ischemia; low risk scan   . Hypertension   . Kidney stones    10-15 yrs ago  . Neuromuscular disorder ([place])   . Shortness of breath    with exertion   . Sleep apnea    uses cpap  . Smoker     Past Surgical History:  Procedure Laterality Date  . ANTERIOR CERVICAL DECOMP/DISCECTOMY FUSION  10/06/2011   Procedure: ANTERIOR CERVICAL DECOMPRESSION/DISCECTOMY FUSION 3 LEVELS;  Surgeon: Faythe Ghee, MD;  Location: [facility];  Service: Neurosurgery;  Laterality: Bilateral;  Cervical three-four, four-five, five-six anterior cervical decompression, fusion  . CARDIAC CATHETERIZATION  10/2010   small bridging segment in LAD  .  TONSILLECTOMY  age 83  . TRANSTHORACIC ECHOCARDIOGRAM  2004   normal LV size and function, aortic root dilated  . TRANSURETHRAL RESECTION OF PROSTATE  08/1996  . TRANSURETHRAL RESECTION OF PROSTATE     long time ago per pt.  10-15 yrs    There were no vitals filed for this visit.  Subjective Assessment - 03/22/18 1411    Subjective  "I did have some soreness in the low back but it really wasn't that bad and it only stayed in my low back"     Currently in Pain?  No/denies    Pain Location  Back    Pain Orientation  Left;Lower    Pain Type  Chronic pain         [facility] PT Assessment - 03/22/18 0001      AROM   Lumbar Flexion  WFL     Lumbar Extension  15      Strength   Right Hip Flexion  5/5    Right Hip Extension  3/5    Right Hip ABduction  3-/5    Left Hip Flexion  5/5    Left Hip Extension  3/5    Left Hip ABduction  3-/5                   Curtis Riley Adult  PT Treatment/Exercise - 03/22/18 1419      Lumbar Exercises: Stretches   Hip Flexor Stretch  Right;Left;2 reps;30 seconds      Lumbar Exercises: Aerobic   Nustep  L6 LE only x 7 min      Knee/Hip Exercises: Standing   Hip Extension  Stengthening;Both;2 sets;10 reps;Knee straight      Knee/Hip Exercises: Sidelying   Clams  bil 3 x 10 with green theraband around the knees      Traction   Type of Traction  Lumbar    Min (lbs)  80    Max (lbs)  110    Hold Time  60s    Rest Time  15s    Time  15             PT Education - 03/22/18 1504    Education Details  reviewed previously provided HEP and benefits of continued strengthening to promote and maintaind current level of progress. discussed benefits of calling [facility] to see if they have any progress that support silver sneakers or something similar.. gradual progression of endurance training regarding walking increasing time every 1-2 weeks by 1-2 mins performed in a controlled setting.     Person(s) Educated  Patient    Methods  Explanation;Verbal  cues;Handout    Comprehension  Verbalized understanding;Verbal cues required       PT Short Term Goals - 03/22/18 1505      PT SHORT TERM GOAL #2   Title  Pt will demonstrate proper posture in sitting and standing without VC greater than 75% of the time to reduce back pain and abnormal muscle tension.    Time  3    Period  Weeks    Status  Partially Met        PT Long Term Goals - 03/22/18 1505      PT LONG TERM GOAL #1   Title  Pt will demonstrate increased lumbar extension AROM to >/= 20 degrees to improve functional mobility and decrease pain    Baseline  improved to 15 degrees today    Time  6    Period  Weeks    Status  Partially Met      PT LONG TERM GOAL #2   Title  Pt will improve overall hip strength to >/= 4/5 MMT strength to improve functional mobility and decrease back pain    Baseline  3-/5 in bil hip abductions but improvement in all other muscles    Time  6    Period  Weeks    Status  Partially Met      PT LONG TERM GOAL #3   Title  Pt will stand for >/= 10 min with LRAD without an increase in pain to improve quality of life and functional mobility    Time  4    Period  Weeks    Status  Achieved      PT LONG TERM GOAL #4   Title  FOTO will improve from 56% limited to </= 36% limited to demonstrate improved quality of life and improved functional mobility.     Time  6    Period  Weeks    Status  Partially Met      PT LONG TERM GOAL #5   Title  "Demonstrate and verbalize techniques to reduce the risk of re-injury including: lifting, posture, body mechanics.     Time  6    Period  Weeks    Status  Achieved  Plan - 03/22/18 1508    Clinical Impression Statement  Curtis Riley had made good progress with physical therapy increasing trunk mobility and bil LE strength, however He continues to demonstrate limited strength with bil hip abductors. he reports he is able to stand and walk longer and feels stronger. He met or partially met all goals  today, he is able to maintain and progress is current level of function independently and will be discharged from PT today.     PT Treatment/Interventions  Therapeutic exercise;Therapeutic activities;Functional mobility training;Gait training;Balance training;Patient/family education;Manual techniques;ADLs/Self Care Home Management;Cryotherapy;Electrical Stimulation;Iontophoresis 50m/ml Dexamethasone;Moist Heat;Ultrasound;Dry needling;Taping    PT Next Visit Plan  D/C    PT Home Exercise Plan  hip flexor stretching, bridges, supine clamshells; lower trunk rotations, standing hip abduction    Consulted and Agree with Plan of Care  Patient       Patient will benefit from skilled therapeutic intervention in order to improve the following deficits and impairments:  Abnormal gait, Pain, Decreased mobility, Hypomobility, Postural dysfunction, Decreased activity tolerance, Decreased range of motion, Decreased strength, Decreased balance, Difficulty walking, Impaired flexibility, Increased fascial restricitons  Visit Diagnosis: Chronic midline low back pain without sciatica  Abnormal posture  Other abnormalities of gait and mobility  Muscle weakness (generalized)     Problem List Patient Active Problem List   Diagnosis Date Noted  . Smoker   . Sleep apnea   . Shortness of breath   . [place]romuscular disorder ([place]   . Kidney stones   . Hypertension   . Family history of coronary artery disease   . Dyslipidemia   . Diverticulosis   . COPD (chronic obstructive pulmonary disease) ([place]   . BPH (benign prostatic hyperplasia)   . Dyspnea 04/29/2017  . Chest pain 03/23/2017  . Arthritis 12/21/2016  . Lung nodules 12/17/2016  . History of asbestos exposure 09/22/2016  . Chronic diastolic heart failure ([place] 09/22/2016  . Chronic back pain 06/09/2016  . Congestive heart failure ([place] 10/19/2014  . OSA on CPAP 10/18/2014  . Chronic obstructive pulmonary disease ([place] 10/09/2014  .  Leukocytosis 10/09/2014  . Chronic respiratory failure with hypoxia ([place] 03/29/2014  . Essential hypertension 03/26/2011  . History of nuclear stress test 03/20/2010    LStarr Lake9/09/2017, 3:13 PM  [facility] [address]Phone: [phone]  Fax:  [phone] Name: Curtis EDLERMRN: [id number] of Birth: [birth year]       PHYSICAL THERAPY DISCHARGE SUMMARY  Visits from Start of Care: 19  Current functional level related to goals / functional outcomes: See goals, FOTO 46% limited   Remaining deficits: Continued flexed trunk position with standing/ walking with bil genu valgum. Intermittent soreness in the low back with prolonged standing/ walking likely related to endurance.    Education / Equipment: HEP, theraband, posture,   Plan: Patient agrees to discharge.  Patient goals were partially met. Patient is being discharged due to being pleased with the current functional level.  ?????          Kristoffer Leamon PT, DPT, LAT, ATC  03/22/18  3:13 PM

## 2018-03-24 ENCOUNTER — Ambulatory Visit: Payer: Medicare Other | Admitting: Physical Therapy

## 2018-05-04 ENCOUNTER — Other Ambulatory Visit: Payer: Self-pay

## 2018-05-04 DIAGNOSIS — M545 Low back pain, unspecified: Secondary | ICD-10-CM

## 2018-05-04 DIAGNOSIS — G8929 Other chronic pain: Secondary | ICD-10-CM

## 2018-05-17 ENCOUNTER — Ambulatory Visit: Payer: Medicare Other | Attending: Family Medicine

## 2018-05-17 ENCOUNTER — Other Ambulatory Visit: Payer: Self-pay

## 2018-05-17 DIAGNOSIS — R2689 Other abnormalities of gait and mobility: Secondary | ICD-10-CM | POA: Insufficient documentation

## 2018-05-17 DIAGNOSIS — M545 Low back pain: Secondary | ICD-10-CM | POA: Insufficient documentation

## 2018-05-17 DIAGNOSIS — R293 Abnormal posture: Secondary | ICD-10-CM | POA: Insufficient documentation

## 2018-05-17 DIAGNOSIS — G8929 Other chronic pain: Secondary | ICD-10-CM | POA: Insufficient documentation

## 2018-05-17 DIAGNOSIS — M6281 Muscle weakness (generalized): Secondary | ICD-10-CM | POA: Insufficient documentation

## 2018-05-17 NOTE — Therapy (Signed)
[place], [place], [address] Phone: [phone]   Fax:  [phone]  Patient Details  Name: Curtis Riley MRN: [id number] Date of Birth: 1935/04/15 Referring Provider:  Denita Lung, MD  Encounter Date: 05/17/2018     Mr Lupinski presented for PT eval and when asked he reported he did not know why he was here. He reports he is doing better with little pain but still limited time on his feet due to pain and SOB. He reports he is doing his HEP and is pleased with current level of function  And pain control.  We agreed PT at this time was not needed. He will contact you in near future for flu shot.  If there was a specific reason  You wanted him seen please let us know and we can start formal PT .  Darrel Hoover   PT 05/17/2018, 1:57 PM  [facility] [address] [place], [place], [address] Phone: [phone]   Fax:  [phone]

## 2018-05-18 ENCOUNTER — Telehealth: Payer: Self-pay

## 2018-05-18 NOTE — Telephone Encounter (Signed)
called pt to close fall and depression . lvm for them to call back [facility]

## 2018-06-06 ENCOUNTER — Other Ambulatory Visit: Payer: Self-pay

## 2018-06-21 ENCOUNTER — Other Ambulatory Visit (INDEPENDENT_AMBULATORY_CARE_PROVIDER_SITE_OTHER): Payer: Medicare Other

## 2018-06-21 DIAGNOSIS — Z23 Encounter for immunization: Secondary | ICD-10-CM

## 2018-08-17 ENCOUNTER — Other Ambulatory Visit: Payer: Self-pay | Admitting: Internal Medicine

## 2018-08-22 ENCOUNTER — Other Ambulatory Visit: Payer: Self-pay | Admitting: Internal Medicine

## 2018-08-22 ENCOUNTER — Encounter: Payer: Self-pay | Admitting: Family Medicine

## 2018-08-22 ENCOUNTER — Ambulatory Visit (INDEPENDENT_AMBULATORY_CARE_PROVIDER_SITE_OTHER): Payer: Medicare Other | Admitting: Family Medicine

## 2018-08-22 VITALS — BP 128/84 | HR 81 | Temp 98.0°F | Wt 242.6 lb

## 2018-08-22 DIAGNOSIS — G8929 Other chronic pain: Secondary | ICD-10-CM

## 2018-08-22 DIAGNOSIS — M545 Low back pain, unspecified: Secondary | ICD-10-CM

## 2018-08-22 NOTE — Progress Notes (Signed)
   Subjective:    Patient ID: Curtis Riley, male    DOB: 1934-11-05, 84 y.o.   MRN: [id number]  HPI He is here for evaluation of a recent fall.  He apparently tripped over a curb and fell.  He needed to be helped up by people near him.  Apparently at one other time he has fallen with little provocation.  He does have a history of chronic back pain and is had x-rays done last year which did show degenerative changes.  He was not having any back pain at the time but does state he gets it fairly frequently and sitting takes the pain away.  This plus the fact that he gets very short of breath with any physical activity makes it difficult for him to be very physically active.   Review of Systems     Objective:   Physical Exam Alert and in no distress.  He was observed splinting while getting out of the chair having to push himself up.  DTRs were difficult to assess.  Negative straight leg raising.  Definite weakness of the quads and hamstrings is noted.  Normal sensation.       Assessment & Plan:  Chronic low back pain without sciatica, unspecified back pain laterality - Plan: Ambulatory referral to Orthopedic Surgery Discussed options with him.  Think it would be reasonable to have orthopedics take a look at him to see if there is anything nonsurgical that might be helpful.  I have a feeling that his main problem is deconditioning as opposed to spinal stenosis or some other identifiable cause.  Will probably eventually refer him to physical therapy.  He is obviously at great risk for falls and potential fracture.

## 2018-08-29 ENCOUNTER — Other Ambulatory Visit (INDEPENDENT_AMBULATORY_CARE_PROVIDER_SITE_OTHER): Payer: Self-pay | Admitting: Orthopaedic Surgery

## 2018-08-29 ENCOUNTER — Ambulatory Visit (INDEPENDENT_AMBULATORY_CARE_PROVIDER_SITE_OTHER): Payer: Medicare Other | Admitting: Orthopaedic Surgery

## 2018-08-29 ENCOUNTER — Encounter (INDEPENDENT_AMBULATORY_CARE_PROVIDER_SITE_OTHER): Payer: Self-pay | Admitting: Orthopaedic Surgery

## 2018-08-29 VITALS — BP 133/84 | HR 76 | Ht 76.0 in | Wt 264.0 lb

## 2018-08-29 DIAGNOSIS — M5442 Lumbago with sciatica, left side: Secondary | ICD-10-CM

## 2018-08-29 DIAGNOSIS — G8929 Other chronic pain: Secondary | ICD-10-CM

## 2018-08-29 DIAGNOSIS — M5441 Lumbago with sciatica, right side: Secondary | ICD-10-CM

## 2018-08-29 NOTE — Progress Notes (Signed)
Consult Note   Patient: Curtis Riley             Date of Birth: 06-26-1935           MRN: [id number]             Visit Date: 08/29/2018  Requested by: Denita Lung, MD [address] [place], [place] [address] PCP: Denita Lung, MD  Thank you for asking me to evaluate this patient for Pain of the Spine  Below are my findings.     Assessment & Plan: Visit Diagnoses:  1. Chronic bilateral low back pain with bilateral sciatica    Plan: Chronic low back pain with what appears to be radiculopathy.  Probably has spinal stenosis.  Will obtain MRI scan and determine treatment based on the results.  Office visit over 45 minutes predominately in counseling regarding potential diagnosis and potential treatment options  Follow Up Instructions: Return after MRI L-S spine.  Orders: No orders of the defined types were placed in this encounter.  No orders of the defined types were placed in this encounter.    Procedures: No procedures performed   Clinical Data: No additional findings.   Subjective:  Chief Complaint  Patient presents with  . Spine - Pain   Patient presents today with lower back pain. He said that he has had lower back pain for 40years. It has gradually progressed over time, but within the last year even more so. He saw his PCP Dr.Lalonde last year and had x-rays taken in May 2019. He went to physical therapy the end of last year and noticed improvement. He is taking OTC Ibuprofen. He has noticed noticed that pain will radiate down both lower extremities, but seems to be worse on the right side. He has noticed some leg weakness bilaterally.  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  He is experiencing predominantly back and bilateral lower extremity pain when he stands for a length of time or when he ambulates.  He does not have any pain when he sits or when he sleeps.  He notes significant  compromise of his activities based on the above.  He did have x-rays performed in May of last year which I reviewed.  There is considerable degenerative disc disease at L2-3 L3-4 and L4-5 with slight listhesis.  The disc space at L5-S1 is narrowed.  HPI  Review of Systems  Constitutional: Positive for fatigue.  HENT: Negative for ear pain.   Eyes: Negative for pain.  Respiratory: Positive for shortness of breath.   Cardiovascular: Positive for leg swelling.  Gastrointestinal: Negative for constipation and diarrhea.  Endocrine: Positive for cold intolerance. Negative for heat intolerance.  Genitourinary: Negative for difficulty urinating.  Skin: Negative for rash.  Allergic/Immunologic: Negative for food allergies.  Neurological: Positive for weakness.  Hematological: Does not bruise/bleed easily.  Psychiatric/Behavioral: Negative for sleep disturbance.     Objective: Vital Signs: BP 133/84   Pulse 76   Ht 6\' 4"  (1.93 m)   Wt 264 lb (119.7 kg)   BMI 32.14 kg/m   Physical Exam Constitutional:      Appearance: He is well-developed.  Eyes:     Pupils: Pupils are equal, round, and reactive to light.  Pulmonary:     Effort: Pulmonary effort is normal.  Skin:    General: Skin is warm and dry.  Neurological:     Mental  Status: He is alert and oriented to person, place, and time.  Psychiatric:        Behavior: Behavior normal.      Ortho Exam awake alert and oriented x3.  Comfortable sitting.  Straight leg raise negative bilaterally.  Painless range of motion both hips.  No percussible tenderness of lumbar spine.  Does have some altered sensibility both of his feet related to "neuropathy".  Motor exams intact  Specialty Comments:  No specialty comments available.  Imaging:  No results found.   PMFS History: Patient Active Problem List   Diagnosis Date Noted  . Smoker   . Sleep apnea   . Shortness of breath   . Neuromuscular disorder ([place])   . Kidney stones   .  Hypertension   . Family history of coronary artery disease   . Dyslipidemia   . Diverticulosis   . COPD (chronic obstructive pulmonary disease) ([place])   . BPH (benign prostatic hyperplasia)   . Dyspnea 04/29/2017  . Chest pain 03/23/2017  . Arthritis 12/21/2016  . Lung nodules 12/17/2016  . History of asbestos exposure 09/22/2016  . Chronic diastolic heart failure ([place]) 09/22/2016  . Chronic back pain 06/09/2016  . Congestive heart failure ([place]) 10/19/2014  . OSA on CPAP 10/18/2014  . Chronic obstructive pulmonary disease ([place]) 10/09/2014  . Leukocytosis 10/09/2014  . Chronic respiratory failure with hypoxia ([place]) 03/29/2014  . Essential hypertension 03/26/2011  . History of nuclear stress test 03/20/2010   Past Medical History:  Diagnosis Date  . Angina    occasional chest pain  never been treated for this ...   . Arthritis   . BPH (benign prostatic hyperplasia)   . COPD (chronic obstructive pulmonary disease) ([place])   . CVA (cerebral infarction)   . Diverticulosis   . Dyslipidemia   . Family history of coronary artery disease    brothers, sisters  . History of nuclear stress test 03/2010   dobutamine; mild ischemia in mid inferior and apical inferior regions; ekg negative for ischemia; low risk scan   . Hypertension   . Kidney stones    10-15 yrs ago  . Neuromuscular disorder ([place])   . Shortness of breath    with exertion   . Sleep apnea    uses cpap  . Smoker     Family History  Problem Relation Age of Onset  . Arthritis Mother   . Hypertension Mother   . Arthritis Father   . Hypertension Father   . Arthritis Sister   . Diabetes Sister   . Hypertension Sister   . Arthritis Brother   . Hypertension Brother    Past Surgical History:  Procedure Laterality Date  . ANTERIOR CERVICAL DECOMP/DISCECTOMY FUSION  10/06/2011   Procedure: ANTERIOR CERVICAL DECOMPRESSION/DISCECTOMY FUSION 3 LEVELS;  Surgeon: Faythe Ghee, MD;  Location: [facility];  Service:  Neurosurgery;  Laterality: Bilateral;  Cervical three-four, four-five, five-six anterior cervical decompression, fusion  . CARDIAC CATHETERIZATION  10/2010   small bridging segment in LAD  . TONSILLECTOMY  age 60  . TRANSTHORACIC ECHOCARDIOGRAM  2004   normal LV size and function, aortic root dilated  . TRANSURETHRAL RESECTION OF PROSTATE  08/1996  . TRANSURETHRAL RESECTION OF PROSTATE     long time ago per pt.  10-15 yrs   Social History   Occupational History  . Not on file  Tobacco Use  . Smoking status: Former Smoker    Packs/day: 1.00    Years: 40.00  Pack years: 40.00    Types: Cigarettes    Last attempt to quit: 07/20/1998    Years since quitting: 20.1  . Smokeless tobacco: Never Used  Substance and Sexual Activity  . Alcohol use: No  . Drug use: No  . Sexual activity: Not on file

## 2018-09-08 ENCOUNTER — Other Ambulatory Visit: Payer: Medicare Other

## 2018-09-09 ENCOUNTER — Emergency Department (HOSPITAL_COMMUNITY): Payer: Medicare Other

## 2018-09-09 ENCOUNTER — Inpatient Hospital Stay (HOSPITAL_COMMUNITY)
Admission: EM | Admit: 2018-09-09 | Discharge: 2018-09-14 | DRG: 470 | Disposition: A | Discharge status: Skilled Nursing Facility | Payer: Medicare Other | Attending: Internal Medicine | Admitting: Internal Medicine

## 2018-09-09 ENCOUNTER — Other Ambulatory Visit: Payer: Self-pay

## 2018-09-09 DIAGNOSIS — N400 Enlarged prostate without lower urinary tract symptoms: Secondary | ICD-10-CM | POA: Diagnosis present

## 2018-09-09 DIAGNOSIS — Z77090 Contact with and (suspected) exposure to asbestos: Secondary | ICD-10-CM | POA: Diagnosis present

## 2018-09-09 DIAGNOSIS — M5116 Intervertebral disc disorders with radiculopathy, lumbar region: Secondary | ICD-10-CM | POA: Diagnosis present

## 2018-09-09 DIAGNOSIS — I509 Heart failure, unspecified: Secondary | ICD-10-CM | POA: Diagnosis not present

## 2018-09-09 DIAGNOSIS — Z7982 Long term (current) use of aspirin: Secondary | ICD-10-CM | POA: Diagnosis not present

## 2018-09-09 DIAGNOSIS — R2681 Unsteadiness on feet: Secondary | ICD-10-CM | POA: Diagnosis present

## 2018-09-09 DIAGNOSIS — Z8673 Personal history of transient ischemic attack (TIA), and cerebral infarction without residual deficits: Secondary | ICD-10-CM

## 2018-09-09 DIAGNOSIS — S72001A Fracture of unspecified part of neck of right femur, initial encounter for closed fracture: Secondary | ICD-10-CM | POA: Diagnosis present

## 2018-09-09 DIAGNOSIS — R52 Pain, unspecified: Secondary | ICD-10-CM | POA: Diagnosis not present

## 2018-09-09 DIAGNOSIS — G4730 Sleep apnea, unspecified: Secondary | ICD-10-CM | POA: Diagnosis present

## 2018-09-09 DIAGNOSIS — I110 Hypertensive heart disease with heart failure: Secondary | ICD-10-CM | POA: Diagnosis not present

## 2018-09-09 DIAGNOSIS — M549 Dorsalgia, unspecified: Secondary | ICD-10-CM

## 2018-09-09 DIAGNOSIS — Z96641 Presence of right artificial hip joint: Secondary | ICD-10-CM | POA: Diagnosis not present

## 2018-09-09 DIAGNOSIS — G8929 Other chronic pain: Secondary | ICD-10-CM | POA: Diagnosis not present

## 2018-09-09 DIAGNOSIS — I10 Essential (primary) hypertension: Secondary | ICD-10-CM | POA: Diagnosis present

## 2018-09-09 DIAGNOSIS — M48061 Spinal stenosis, lumbar region without neurogenic claudication: Secondary | ICD-10-CM | POA: Diagnosis present

## 2018-09-09 DIAGNOSIS — Z880 Allergy status to penicillin: Secondary | ICD-10-CM

## 2018-09-09 DIAGNOSIS — J449 Chronic obstructive pulmonary disease, unspecified: Secondary | ICD-10-CM | POA: Diagnosis present

## 2018-09-09 DIAGNOSIS — S72041A Displaced fracture of base of neck of right femur, initial encounter for closed fracture: Secondary | ICD-10-CM | POA: Diagnosis not present

## 2018-09-09 DIAGNOSIS — Y92000 Kitchen of unspecified non-institutional (private) residence as  the place of occurrence of the external cause: Secondary | ICD-10-CM | POA: Diagnosis not present

## 2018-09-09 DIAGNOSIS — Z01818 Encounter for other preprocedural examination: Secondary | ICD-10-CM

## 2018-09-09 DIAGNOSIS — D62 Acute posthemorrhagic anemia: Secondary | ICD-10-CM | POA: Diagnosis not present

## 2018-09-09 DIAGNOSIS — I444 Left anterior fascicular block: Secondary | ICD-10-CM | POA: Diagnosis not present

## 2018-09-09 DIAGNOSIS — W1839XA Other fall on same level, initial encounter: Secondary | ICD-10-CM | POA: Diagnosis present

## 2018-09-09 DIAGNOSIS — Z8261 Family history of arthritis: Secondary | ICD-10-CM

## 2018-09-09 DIAGNOSIS — Z9989 Dependence on other enabling machines and devices: Secondary | ICD-10-CM

## 2018-09-09 DIAGNOSIS — M545 Low back pain: Secondary | ICD-10-CM

## 2018-09-09 DIAGNOSIS — M1990 Unspecified osteoarthritis, unspecified site: Secondary | ICD-10-CM | POA: Diagnosis not present

## 2018-09-09 DIAGNOSIS — N183 Chronic kidney disease, stage 3 unspecified: Secondary | ICD-10-CM | POA: Diagnosis present

## 2018-09-09 DIAGNOSIS — Z9981 Dependence on supplemental oxygen: Secondary | ICD-10-CM | POA: Diagnosis not present

## 2018-09-09 DIAGNOSIS — Z471 Aftercare following joint replacement surgery: Secondary | ICD-10-CM | POA: Diagnosis not present

## 2018-09-09 DIAGNOSIS — W19XXXA Unspecified fall, initial encounter: Secondary | ICD-10-CM

## 2018-09-09 DIAGNOSIS — Z79899 Other long term (current) drug therapy: Secondary | ICD-10-CM | POA: Diagnosis not present

## 2018-09-09 DIAGNOSIS — Z8249 Family history of ischemic heart disease and other diseases of the circulatory system: Secondary | ICD-10-CM

## 2018-09-09 DIAGNOSIS — I129 Hypertensive chronic kidney disease with stage 1 through stage 4 chronic kidney disease, or unspecified chronic kidney disease: Secondary | ICD-10-CM | POA: Diagnosis present

## 2018-09-09 DIAGNOSIS — M25572 Pain in left ankle and joints of left foot: Secondary | ICD-10-CM | POA: Diagnosis not present

## 2018-09-09 DIAGNOSIS — G4733 Obstructive sleep apnea (adult) (pediatric): Secondary | ICD-10-CM | POA: Diagnosis not present

## 2018-09-09 DIAGNOSIS — I959 Hypotension, unspecified: Secondary | ICD-10-CM | POA: Diagnosis not present

## 2018-09-09 DIAGNOSIS — R55 Syncope and collapse: Secondary | ICD-10-CM | POA: Diagnosis not present

## 2018-09-09 DIAGNOSIS — M5410 Radiculopathy, site unspecified: Secondary | ICD-10-CM | POA: Diagnosis not present

## 2018-09-09 DIAGNOSIS — S72141A Displaced intertrochanteric fracture of right femur, initial encounter for closed fracture: Principal | ICD-10-CM | POA: Diagnosis present

## 2018-09-09 DIAGNOSIS — E785 Hyperlipidemia, unspecified: Secondary | ICD-10-CM | POA: Diagnosis not present

## 2018-09-09 DIAGNOSIS — S3992XA Unspecified injury of lower back, initial encounter: Secondary | ICD-10-CM | POA: Diagnosis not present

## 2018-09-09 DIAGNOSIS — Z87442 Personal history of urinary calculi: Secondary | ICD-10-CM | POA: Diagnosis not present

## 2018-09-09 DIAGNOSIS — Z4789 Encounter for other orthopedic aftercare: Secondary | ICD-10-CM | POA: Diagnosis not present

## 2018-09-09 DIAGNOSIS — Z419 Encounter for procedure for purposes other than remedying health state, unspecified: Secondary | ICD-10-CM

## 2018-09-09 DIAGNOSIS — S72044D Nondisplaced fracture of base of neck of right femur, subsequent encounter for closed fracture with routine healing: Secondary | ICD-10-CM | POA: Diagnosis not present

## 2018-09-09 DIAGNOSIS — M25551 Pain in right hip: Secondary | ICD-10-CM | POA: Diagnosis not present

## 2018-09-09 DIAGNOSIS — Z87891 Personal history of nicotine dependence: Secondary | ICD-10-CM

## 2018-09-09 LAB — CBC WITH DIFFERENTIAL/PLATELET
Abs Immature Granulocytes: 0.04 10*3/uL (ref 0.00–0.07)
Basophils Absolute: 0 10*3/uL (ref 0.0–0.1)
Basophils Relative: 0 %
Eosinophils Absolute: 0 10*3/uL (ref 0.0–0.5)
Eosinophils Relative: 0 %
HCT: 43 % (ref 39.0–52.0)
Hemoglobin: 13.3 g/dL (ref 13.0–17.0)
Immature Granulocytes: 0 %
Lymphocytes Relative: 10 %
Lymphs Abs: 0.9 10*3/uL (ref 0.7–4.0)
MCH: 30.9 pg (ref 26.0–34.0)
MCHC: 30.9 g/dL (ref 30.0–36.0)
MCV: 100 fL (ref 80.0–100.0)
Monocytes Absolute: 0.6 10*3/uL (ref 0.1–1.0)
Monocytes Relative: 6 %
Neutro Abs: 7.7 10*3/uL (ref 1.7–7.7)
Neutrophils Relative %: 84 %
Platelets: 197 10*3/uL (ref 150–400)
RBC: 4.3 MIL/uL (ref 4.22–5.81)
RDW: 12.1 % (ref 11.5–15.5)
WBC: 9.3 10*3/uL (ref 4.0–10.5)
nRBC: 0 % (ref 0.0–0.2)

## 2018-09-09 LAB — COMPREHENSIVE METABOLIC PANEL
ALT: 19 U/L (ref 0–44)
AST: 36 U/L (ref 15–41)
Albumin: 3.7 g/dL (ref 3.5–5.0)
Alkaline Phosphatase: 74 U/L (ref 38–126)
Anion gap: 8 (ref 5–15)
BUN: 17 mg/dL (ref 8–23)
CALCIUM: 9.2 mg/dL (ref 8.9–10.3)
CO2: 24 mmol/L (ref 22–32)
Chloride: 108 mmol/L (ref 98–111)
Creatinine, Ser: 1.56 mg/dL — ABNORMAL HIGH (ref 0.61–1.24)
GFR calc Af Amer: 48 mL/min — ABNORMAL LOW (ref >60)
GFR calc non Af Amer: 41 mL/min — ABNORMAL LOW (ref >60)
Glucose, Bld: 83 mg/dL (ref 70–99)
Potassium: 5.1 mmol/L (ref 3.5–5.1)
Sodium: 140 mmol/L (ref 135–145)
Total Bilirubin: 1.6 mg/dL — ABNORMAL HIGH (ref 0.3–1.2)
Total Protein: 6.9 g/dL (ref 6.5–8.1)

## 2018-09-09 LAB — TYPE AND SCREEN
ABO/RH(D): O POS
Antibody Screen: NEGATIVE

## 2018-09-09 LAB — PROTIME-INR
INR: 1.07
Prothrombin Time: 13.9 seconds (ref 11.4–15.2)

## 2018-09-09 MED ORDER — HYDROCODONE-ACETAMINOPHEN 5-325 MG PO TABS: 1.0000 | ORAL_TABLET | Freq: Four times a day (QID) | ORAL | Status: DC | PRN

## 2018-09-09 MED ORDER — CARVEDILOL 25 MG PO TABS
25.0000 mg | ORAL_TABLET | Freq: Two times a day (BID) | ORAL | Status: DC
  Administered 2018-09-09 – 2018-09-11 (×4): 25 mg via ORAL
  Filled 2018-09-09 (×4): qty 1

## 2018-09-09 MED ORDER — MORPHINE SULFATE (PF) 2 MG/ML IV SOLN
0.5000 mg | INTRAVENOUS | Status: DC | PRN
  Administered 2018-09-09 – 2018-09-10 (×3): 0.5 mg via INTRAVENOUS
  Filled 2018-09-09 (×3): qty 1

## 2018-09-09 MED ORDER — IPRATROPIUM-ALBUTEROL 0.5-2.5 (3) MG/3ML IN SOLN
3.0000 mL | Freq: Four times a day (QID) | RESPIRATORY_TRACT | Status: DC | PRN
  Administered 2018-09-10: 3 mL via RESPIRATORY_TRACT
  Filled 2018-09-09: qty 3

## 2018-09-09 MED ORDER — IBUPROFEN 400 MG PO TABS: 400.0000 mg | ORAL_TABLET | Freq: Four times a day (QID) | ORAL | Status: DC | PRN

## 2018-09-09 MED ORDER — FENTANYL CITRATE (PF) 100 MCG/2ML IJ SOLN
50.0000 ug | Freq: Once | INTRAMUSCULAR | Status: AC
  Administered 2018-09-09: 50 ug via INTRAVENOUS
  Filled 2018-09-09: qty 2

## 2018-09-09 MED ORDER — IPRATROPIUM-ALBUTEROL 20-100 MCG/ACT IN AERS: 1.0000 | INHALATION_SPRAY | Freq: Four times a day (QID) | RESPIRATORY_TRACT | Status: DC | PRN

## 2018-09-09 MED ORDER — LISINOPRIL 20 MG PO TABS
40.0000 mg | ORAL_TABLET | Freq: Every day | ORAL | Status: DC
  Administered 2018-09-10 – 2018-09-11 (×2): 40 mg via ORAL
  Filled 2018-09-09 (×2): qty 2

## 2018-09-09 MED ORDER — ONDANSETRON HCL 4 MG/2ML IJ SOLN
4.0000 mg | Freq: Once | INTRAMUSCULAR | Status: AC
  Administered 2018-09-09: 4 mg via INTRAVENOUS
  Filled 2018-09-09: qty 2

## 2018-09-09 NOTE — ED Provider Notes (Signed)
[facility] DEPT Provider Note   CSN: [id number] Arrival date & time: 09/09/18  1844    History   Chief Complaint Chief Complaint  Patient presents with  . Fall  . Hip Pain    HPI Curtis Riley is a 84 y.o. male.     The history is provided by the patient and medical records. No language interpreter was used.  Hip Pain  The current episode started 1 to 2 hours ago. The problem occurs constantly. Pertinent negatives include no chest pain, no abdominal pain, no headaches and no shortness of breath. Nothing aggravates the symptoms. Nothing relieves the symptoms. He has tried nothing for the symptoms. The treatment provided no relief.    Past Medical History:  Diagnosis Date  . Angina    occasional chest pain  never been treated for this ...   . Arthritis   . BPH (benign prostatic hyperplasia)   . COPD (chronic obstructive pulmonary disease) ([place])   . CVA (cerebral infarction)   . Diverticulosis   . Dyslipidemia   . Family history of coronary artery disease    brothers, sisters  . History of nuclear stress test 03/2010   dobutamine; mild ischemia in mid inferior and apical inferior regions; ekg negative for ischemia; low risk scan   . Hypertension   . Kidney stones    10-15 yrs ago  . Neuromuscular disorder ([place])   . Shortness of breath    with exertion   . Sleep apnea    uses cpap  . Smoker     Patient Active Problem List   Diagnosis Date Noted  . Smoker   . Sleep apnea   . Shortness of breath   . Neuromuscular disorder ([place])   . Kidney stones   . Hypertension   . Family history of coronary artery disease   . Dyslipidemia   . Diverticulosis   . COPD (chronic obstructive pulmonary disease) ([place])   . BPH (benign prostatic hyperplasia)   . Dyspnea 04/29/2017  . Chest pain 03/23/2017  . Arthritis 12/21/2016  . Lung nodules 12/17/2016  . History of asbestos exposure 09/22/2016  . Chronic diastolic heart failure ([place])  09/22/2016  . Chronic back pain 06/09/2016  . Congestive heart failure ([place]) 10/19/2014  . OSA on CPAP 10/18/2014  . Chronic obstructive pulmonary disease ([place]) 10/09/2014  . Leukocytosis 10/09/2014  . Chronic respiratory failure with hypoxia ([place]) 03/29/2014  . Essential hypertension 03/26/2011  . History of nuclear stress test 03/20/2010    Past Surgical History:  Procedure Laterality Date  . ANTERIOR CERVICAL DECOMP/DISCECTOMY FUSION  10/06/2011   Procedure: ANTERIOR CERVICAL DECOMPRESSION/DISCECTOMY FUSION 3 LEVELS;  Surgeon: Faythe Ghee, MD;  Location: [facility];  Service: Neurosurgery;  Laterality: Bilateral;  Cervical three-four, four-five, five-six anterior cervical decompression, fusion  . CARDIAC CATHETERIZATION  10/2010   small bridging segment in LAD  . TONSILLECTOMY  age 70  . TRANSTHORACIC ECHOCARDIOGRAM  2004   normal LV size and function, aortic root dilated  . TRANSURETHRAL RESECTION OF PROSTATE  08/1996  . TRANSURETHRAL RESECTION OF PROSTATE     long time ago per pt.  10-15 yrs        Home Medications    Prior to Admission medications   Medication Sig Start Date End Date Taking? Authorizing Provider  aspirin 81 MG tablet Take 81 mg by mouth daily.    [provider]  carvedilol (COREG) 25 MG tablet TAKE 1 TABLET TWICE A DAY  12/22/17   Hilty, Nadean Corwin, MD  COMBIVENT RESPIMAT 20-100 MCG/ACT AERS respimat USE 1 INHALATION EVERY 6 HOURS 12/23/17   Denita Lung, MD  furosemide (LASIX) 40 MG tablet TAKE ONE AND ONE-HALF TABLETS DAILY 08/18/18   Hilty, Nadean Corwin, MD  Green Tea, Camillia sinensis, (GREEN TEA PO) Take 1 tablet by mouth daily.    [provider]  ibuprofen (ADVIL,MOTRIN) 200 MG tablet Take by mouth every 6 (six) hours as needed for mild pain.     [provider]  ipratropium-albuterol (DUONEB) 0.5-2.5 (3) MG/3ML SOLN INHALE 1 VIAL VIA NEBULIZER EVERY 4 HOURS AS NEEDED 03/30/17   Denita Lung, MD  KLOR-CON M10 10 MEQ  tablet TAKE 1 TABLET DAILY 10/12/17   Denita Lung, MD  lisinopril (PRINIVIL,ZESTRIL) 40 MG tablet TAKE 1 TABLET DAILY 08/23/18   Hilty, Nadean Corwin, MD  nitroGLYCERIN (NITROSTAT) 0.4 MG SL tablet Place 1 tablet (0.4 mg total) under the tongue every 5 (five) minutes as needed for chest pain. 09/21/16   Hilty, Nadean Corwin, MD  NON FORMULARY at bedtime. CPAP    [provider]  OXYGEN Inhale into the lungs. Uses Oxygen at night with CPAP    [provider]  terbinafine (LAMISIL) 250 MG tablet Take 1 tablet (250 mg total) daily by mouth. 05/31/17   Denita Lung, MD    Family History Family History  Problem Relation Age of Onset  . Arthritis Mother   . Hypertension Mother   . Arthritis Father   . Hypertension Father   . Arthritis Sister   . Diabetes Sister   . Hypertension Sister   . Arthritis Brother   . Hypertension Brother     Social History Social History   Tobacco Use  . Smoking status: Former Smoker    Packs/day: 1.00    Years: 40.00    Pack years: 40.00    Types: Cigarettes    Last attempt to quit: 07/20/1998    Years since quitting: 20.1  . Smokeless tobacco: Never Used  Substance Use Topics  . Alcohol use: No  . Drug use: No     Allergies   Penicillins   Review of Systems Review of Systems  Constitutional: Negative for chills, diaphoresis, fatigue and fever.  HENT: Negative for congestion.   Eyes: Negative for visual disturbance.  Respiratory: Negative for cough, chest tightness, shortness of breath and wheezing.   Cardiovascular: Negative for chest pain, palpitations and leg swelling.  Gastrointestinal: Negative for abdominal pain, constipation, diarrhea, nausea and vomiting.  Genitourinary: Negative for dysuria, flank pain, frequency and penile pain.  Musculoskeletal: Negative for back pain, neck pain and neck stiffness.  Skin: Negative for rash and wound.  Neurological: Negative for weakness, light-headedness, numbness and headaches.    Psychiatric/Behavioral: Negative for agitation.  All other systems reviewed and are negative.    Physical Exam Updated Vital Signs There were no vitals taken for this visit.  Physical Exam Vitals signs and nursing note reviewed.  Constitutional:      General: He is not in acute distress.    Appearance: He is well-developed. He is not ill-appearing, toxic-appearing or diaphoretic.  HENT:     Head: Normocephalic and atraumatic.     Nose: No congestion or rhinorrhea.     Mouth/Throat:     Mouth: Mucous membranes are moist.     Pharynx: No oropharyngeal exudate or posterior oropharyngeal erythema.  Eyes:     Conjunctiva/sclera: Conjunctivae normal.  Pupils: Pupils are equal, round, and reactive to light.  Neck:     Musculoskeletal: Neck supple.  Cardiovascular:     Rate and Rhythm: Normal rate and regular rhythm.     Heart sounds: No murmur.  Pulmonary:     Effort: Pulmonary effort is normal. No respiratory distress.     Breath sounds: Normal breath sounds. No wheezing, rhonchi or rales.  Chest:     Chest wall: No tenderness.  Abdominal:     General: Abdomen is flat. There is no distension.     Palpations: Abdomen is soft.     Tenderness: There is no abdominal tenderness. There is no right CVA tenderness or left CVA tenderness.  Musculoskeletal:        General: Tenderness and signs of injury present.     Right hip: He exhibits decreased range of motion (severe pain) and tenderness.     Lumbar back: He exhibits no tenderness, no pain and no spasm.     Right lower leg: No edema.     Left lower leg: No edema.       Legs:  Skin:    General: Skin is warm and dry.     Capillary Refill: Capillary refill takes less than 2 seconds.     Findings: No erythema or rash.  Neurological:     General: No focal deficit present.     Mental Status: He is alert.  Psychiatric:        Mood and Affect: Mood normal.      ED Treatments / Results  Labs (all labs ordered are listed,  but only abnormal results are displayed) Labs Reviewed  COMPREHENSIVE METABOLIC PANEL - Abnormal; Notable for the following components:      Result Value   Creatinine, Ser 1.56 (*)    Total Bilirubin 1.6 (*)    GFR calc non Af Amer 41 (*)    GFR calc Af Amer 48 (*)    All other components within normal limits  SURGICAL PCR SCREEN  CBC WITH DIFFERENTIAL/PLATELET  PROTIME-INR  TYPE AND SCREEN  ABO/RH    EKG EKG Interpretation  Date/Time:  Friday September 09 2018 20:23:57 EST Ventricular Rate:  91 PR Interval:    QRS Duration: 118 QT Interval:  359 QTC Calculation: 442 R Axis:   -74 Text Interpretation:  Sinus rhythm Left anterior fascicular block Borderline low voltage, extremity leads Consider anterior infarct When compared to prior, no significnat changes seen.  No STEMI Confirmed by Antony Blackbird [phone]) on 09/09/2018 8:40:33 PM   Radiology Dg Chest Port 1 View  Result Date: 09/09/2018 CLINICAL DATA:  Preop hip surgery EXAM: PORTABLE CHEST 1 VIEW COMPARISON:  CT 10/26/2017, radiograph 09/24/2016 FINDINGS: Postsurgical changes of the cervical spine. No acute opacity or pleural effusion. Normal cardiomediastinal silhouette. No pneumothorax. IMPRESSION: No active disease. Electronically Signed   By: Donavan Foil M.D.   On: 09/09/2018 20:28   Dg Hip Unilat  With Pelvis 2-3 Views Right  Result Date: 09/09/2018 CLINICAL DATA:  Patient fell 2 hours ago. Right hip pain. EXAM: DG HIP (WITH OR WITHOUT PELVIS) 2-3V RIGHT COMPARISON:  None. FINDINGS: Acute varus angulated transcervical fracture of the right femur. No joint dislocation. Levoconvex curvature of the lumbar spine with transitional vertebral anatomy an assimilation joint seen on right. Multilevel degenerative disc disease of the included lumbar spine is noted. No pelvic fracture or diastasis. Native left hip appears intact. IMPRESSION: 1. Acute varus angulated transcervical fracture of the right femur. 2.  Lumbar spondylosis with  levoscoliosis. Transitional lumbosacral vertebral anatomy with assimilation joint on the right. Electronically Signed   By: Ashley Royalty M.D.   On: 09/09/2018 19:43    Procedures Procedures (including critical care time)  Medications Ordered in ED Medications  HYDROcodone-acetaminophen (NORCO/VICODIN) 5-325 MG per tablet 1-2 tablet (has no administration in time range)  morphine 2 MG/ML injection 0.5 mg (has no administration in time range)  carvedilol (COREG) tablet 25 mg (has no administration in time range)  ipratropium-albuterol (DUONEB) 0.5-2.5 (3) MG/3ML nebulizer solution 3 mL (has no administration in time range)  ibuprofen (ADVIL,MOTRIN) tablet 400 mg (has no administration in time range)  lisinopril (PRINIVIL,ZESTRIL) tablet 40 mg (has no administration in time range)  fentaNYL (SUBLIMAZE) injection 50 mcg (50 mcg Intravenous Given 09/09/18 2108)  ondansetron (ZOFRAN) injection 4 mg (4 mg Intravenous Given 09/09/18 2108)     Initial Impression / Assessment and Plan / ED Course  I have reviewed the triage vital signs and the nursing notes.  Pertinent labs & imaging results that were available during my care of the patient were reviewed by me and considered in my medical decision making (see chart for details).        Curtis Riley is a 84 y.o. male with a past medical history significant for BPH, hypertension, COPD, prior stroke, and chronic low back pain with bilateral sciatica who presents with a fall and right hip pain.  Patient reports that today he stood up and was twisting in his kitchen when he got tangled up in his legs and fell landing on his right hip.  He denies hitting his back, neck, or head.  No loss of conscious.  He reports immediate onset of severe right hip pain that did not radiate.  He denies numbness or tingling.  He reports chronic low back pain and has had several falls in the last month and is currently being worked up by [employer] with an MRI  coming up next week but he is concerned he may have injured his right hip.  No history of hip fracture dislocations or surgeries.  He reports the pain is severe when he tries to move it or walk but is currently a 4-5 out of 10 at rest.  He has no other complaints and had no preceding symptoms.  He denied a loss of bowel or bladder function.  On exam, lungs clear chest nontender.  Abdomen nontender.  Patient had bilateral normal sensation in his legs.  Normal pulses in the DP arteries.  Patient could wiggle his toes and move his ankles without difficulty.  Any movement of the right hip because severe pain.  No other leg tenderness.  Pelvis felt stable.  Back nontender.  No midline or paraspinal lumbar tenderness on my exam.  Clinical concern patient may have broken his right hip.  X-rays will be obtained.  Patient did not want medication until work-up was initiated.  Anticipate reassessment.  8:15 PM X-ray shows evidence of acute hip fracture.  Orthopedics was called who will see patient.  They requested patient admitted to medicine for further management.  Medicine team called admission.  Screening labs chest x-ray and EKG ordered for preoperative management.  Patient will be admitted.    Final Clinical Impressions(s) / ED Diagnoses   Final diagnoses:  Fall, initial encounter  Closed displaced intertrochanteric fracture of right femur, initial encounter [facility])    ED Discharge Orders    None  Clinical Impression: 1. Fall, initial encounter   2. Pre-op exam   3. Closed displaced intertrochanteric fracture of right femur, initial encounter [facility])     Disposition: Admit  This note was prepared with assistance of Dragon voice recognition software. Occasional wrong-word or sound-a-like substitutions may have occurred due to the inherent limitations of voice recognition software.      Tonji Elliff, Gwenyth Allegra, MD 09/09/18 2245

## 2018-09-09 NOTE — ED Notes (Signed)
ED TO INPATIENT HANDOFF REPORT  Name/Age/Gender Curtis Riley 84 y.o. male  Code Status    Code Status Orders  (From admission, onward)         Start     Ordered   09/09/18 2047  Full code  Continuous     09/09/18 2050        Code Status History    Date Active Date Inactive Code Status Order ID Comments User Context   10/07/2014 1258 10/10/2014 1544 Full Code [id number]  Velna Hatchet, MD ED    Advance Directive Documentation     Most Recent Value  Type of Advance Directive  Living will  Pre-existing out of facility DNR order (yellow form or pink MOST form)  -  "MOST" Form in Place?  -      Home/SNF/Other Rehab  Chief Complaint fall; hip pain  Level of Care/Admitting Diagnosis ED Disposition    ED Disposition Condition [place]: [place] [100102]  Level of Care: Med-Surg [16]  Diagnosis: Closed right hip fracture, initial encounter [facility]) [[id number]]  Admitting Physician: Doreatha Massed  Attending Physician: Etta Quill [phone]  Estimated length of stay: past midnight tomorrow  Certification:: I certify this patient will need inpatient services for at least 2 midnights  PT Class (Do Not Modify): Inpatient [101]  PT Acc Code (Do Not Modify): Private [1]       Medical History Past Medical History:  Diagnosis Date  . Angina    occasional chest pain  never been treated for this ...   . Arthritis   . BPH (benign prostatic hyperplasia)   . COPD (chronic obstructive pulmonary disease) ([place])   . CVA (cerebral infarction)   . Diverticulosis   . Dyslipidemia   . Family history of coronary artery disease    brothers, sisters  . History of nuclear stress test 03/2010   dobutamine; mild ischemia in mid inferior and apical inferior regions; ekg negative for ischemia; low risk scan   . Hypertension   . Kidney stones    10-15 yrs ago  . Neuromuscular disorder ([place])   . Shortness of breath    with  exertion   . Sleep apnea    uses cpap  . Smoker     Allergies Allergies  Allergen Reactions  . Penicillins Anaphylaxis    IV Location/Drains/Wounds Patient Lines/Drains/Airways Status   Active Line/Drains/Airways    Name:   Placement date:   Placement time:   Site:   Days:   Peripheral IV 09/09/18 Right Hand   09/09/18    2100    Hand   less than 1          Labs/Imaging No results found for this or any previous visit (from the past 50 hour(s)). Dg Chest Port 1 View  Result Date: 09/09/2018 CLINICAL DATA:  Preop hip surgery EXAM: PORTABLE CHEST 1 VIEW COMPARISON:  CT 10/26/2017, radiograph 09/24/2016 FINDINGS: Postsurgical changes of the cervical spine. No acute opacity or pleural effusion. Normal cardiomediastinal silhouette. No pneumothorax. IMPRESSION: No active disease. Electronically Signed   By: Donavan Foil M.D.   On: 09/09/2018 20:28   Dg Hip Unilat  With Pelvis 2-3 Views Right  Result Date: 09/09/2018 CLINICAL DATA:  Patient fell 2 hours ago. Right hip pain. EXAM: DG HIP (WITH OR WITHOUT PELVIS) 2-3V RIGHT COMPARISON:  None. FINDINGS: Acute varus angulated transcervical fracture of the right femur. No joint dislocation. Levoconvex curvature of  the lumbar spine with transitional vertebral anatomy an assimilation joint seen on right. Multilevel degenerative disc disease of the included lumbar spine is noted. No pelvic fracture or diastasis. Native left hip appears intact. IMPRESSION: 1. Acute varus angulated transcervical fracture of the right femur. 2. Lumbar spondylosis with levoscoliosis. Transitional lumbosacral vertebral anatomy with assimilation joint on the right. Electronically Signed   By: Ashley Royalty M.D.   On: 09/09/2018 19:43    Pending Labs Unresulted Labs (From admission, onward)    Start     Ordered   09/09/18 2050  Type and screen [facility]  Once,   R    Comments:  [place]    09/09/18 2050   09/09/18 1950  CBC  with Differential  ONCE - STAT,   STAT     09/09/18 1950   09/09/18 1950  Comprehensive metabolic panel  ONCE - STAT,   STAT     09/09/18 1950   09/09/18 1950  Protime-INR  ONCE - STAT,   STAT     09/09/18 1950          Vitals/Pain Today's Vitals   09/09/18 1855 09/09/18 1856 09/09/18 2100 09/09/18 2126  BP: (!) 144/111  129/78   Pulse: (!) 104  97   Resp: 20  (!) 30   Temp: 98.9 F (37.2 C)     TempSrc: Oral     SpO2: 96%  96%   Weight:  119.7 kg    Height:  6\' 4"  (1.93 m)    PainSc:  9   3     Isolation Precautions No active isolations  Medications Medications  HYDROcodone-acetaminophen (NORCO/VICODIN) 5-325 MG per tablet 1-2 tablet (has no administration in time range)  morphine 2 MG/ML injection 0.5 mg (has no administration in time range)  carvedilol (COREG) tablet 25 mg (has no administration in time range)  ipratropium-albuterol (DUONEB) 0.5-2.5 (3) MG/3ML nebulizer solution 3 mL (has no administration in time range)  ibuprofen (ADVIL,MOTRIN) tablet 400 mg (has no administration in time range)  lisinopril (PRINIVIL,ZESTRIL) tablet 40 mg (has no administration in time range)  fentaNYL (SUBLIMAZE) injection 50 mcg (50 mcg Intravenous Given 09/09/18 2108)  ondansetron (ZOFRAN) injection 4 mg (4 mg Intravenous Given 09/09/18 2108)    Mobility non-ambulatory

## 2018-09-09 NOTE — ED Notes (Signed)
WILL TRANSPORT PT TO 3W 1326-1. AAOX4. PT IN NO APPARENT DISTRESS WITH MILD PAIN. FAMILY AT THE BEDSIDE. THE OPPORTUNITY TO ASK QUESTIONS WAS PROVIDED.

## 2018-09-09 NOTE — ED Triage Notes (Signed)
Pt is BIB EMS from home. Pt reports a fall approximately 2 hours ago and had been on the floor since.  Pt reports right hip pain, no shortening noted by EMS. PMS in tact. PT reports multiple recent falls and back pain that "makes him feel weak" and causes him to fall. 130/90 BP 100 HR 18 RR 98% RA 107 CBP

## 2018-09-09 NOTE — ED Notes (Signed)
Bed: WA02 Expected date:  Expected time:  Means of arrival:  Comments: EMS-fall/hip pain

## 2018-09-09 NOTE — H&P (Signed)
History and Physical    Curtis Riley:[id number] DOB: 03-18-1935 DOA: 09/09/2018  PCP: Denita Lung, MD  Patient coming from: Home  I have personally briefly reviewed patient's old medical records in [place]  Chief Complaint: R hip pain, fall  HPI: Curtis Riley is a 84 y.o. male with medical history significant of COPD, OSA on CPAP, HTN.  Patient is currently being worked up for radiculopathy by ortho, actually had MRI of L spine scheduled for Sunday due to progressive worsening back pain with leg weakness.  This evening while getting out of chair he suffered a mechanical fall, landing on R hip.  Inability to ambulate following fall and severe R hip pain.  Taken to hospital.   ED Course: Displaced R hip fx.  Dr. Ninfa Linden planning on [place] at noon tomorrow.   Review of Systems: As per HPI otherwise 10 point review of systems negative.   Past Medical History:  Diagnosis Date  . Angina    occasional chest pain  never been treated for this ...   . Arthritis   . BPH (benign prostatic hyperplasia)   . COPD (chronic obstructive pulmonary disease) ([place])   . CVA (cerebral infarction)   . Diverticulosis   . Dyslipidemia   . Family history of coronary artery disease    brothers, sisters  . History of nuclear stress test 03/2010   dobutamine; mild ischemia in mid inferior and apical inferior regions; ekg negative for ischemia; low risk scan   . Hypertension   . Kidney stones    10-15 yrs ago  . Neuromuscular disorder ([place])   . Shortness of breath    with exertion   . Sleep apnea    uses cpap  . Smoker     Past Surgical History:  Procedure Laterality Date  . ANTERIOR CERVICAL DECOMP/DISCECTOMY FUSION  10/06/2011   Procedure: ANTERIOR CERVICAL DECOMPRESSION/DISCECTOMY FUSION 3 LEVELS;  Surgeon: Faythe Ghee, MD;  Location: [facility];  Service: Neurosurgery;  Laterality: Bilateral;  Cervical three-four, four-five, five-six anterior cervical decompression,  fusion  . CARDIAC CATHETERIZATION  10/2010   small bridging segment in LAD  . TONSILLECTOMY  age 65  . TRANSTHORACIC ECHOCARDIOGRAM  2004   normal LV size and function, aortic root dilated  . TRANSURETHRAL RESECTION OF PROSTATE  08/1996  . TRANSURETHRAL RESECTION OF PROSTATE     long time ago per pt.  10-15 yrs     reports that he quit smoking about 20 years ago. His smoking use included cigarettes. He has a 40.00 pack-year smoking history. He has never used smokeless tobacco. He reports that he does not drink alcohol or use drugs.  Allergies  Allergen Reactions  . Penicillins Anaphylaxis    Family History  Problem Relation Age of Onset  . Arthritis Mother   . Hypertension Mother   . Arthritis Father   . Hypertension Father   . Arthritis Sister   . Diabetes Sister   . Hypertension Sister   . Arthritis Brother   . Hypertension Brother      Prior to Admission medications   Medication Sig Start Date End Date Taking? Authorizing Provider  aspirin 81 MG tablet Take 81 mg by mouth daily.   Yes [provider]  carvedilol (COREG) 25 MG tablet TAKE 1 TABLET TWICE A DAY 12/22/17  Yes Hilty, Nadean Corwin, MD  furosemide (LASIX) 40 MG tablet TAKE ONE AND ONE-HALF TABLETS DAILY Patient taking differently: Take 60 mg by mouth  daily. TAKE ONE AND ONE-HALF TABLETS DAILY 08/18/18  Yes Hilty, Nadean Corwin, MD  Green Tea, Camillia sinensis, (GREEN TEA PO) Take 1 tablet by mouth daily.   Yes [provider]  ibuprofen (ADVIL,MOTRIN) 200 MG tablet Take 400 mg by mouth every 6 (six) hours as needed for mild pain or moderate pain.    Yes [provider]  ipratropium-albuterol (DUONEB) 0.5-2.5 (3) MG/3ML SOLN INHALE 1 VIAL VIA NEBULIZER EVERY 4 HOURS AS NEEDED Patient taking differently: Inhale 3 mLs into the lungs every 6 (six) hours as needed (sob and wheezing).  03/30/17  Yes Denita Lung, MD  KLOR-CON M10 10 MEQ tablet TAKE 1 TABLET DAILY 10/12/17  Yes Denita Lung, MD    lisinopril (PRINIVIL,ZESTRIL) 40 MG tablet TAKE 1 TABLET DAILY 08/23/18  Yes Hilty, Nadean Corwin, MD  COMBIVENT RESPIMAT 20-100 MCG/ACT AERS respimat USE 1 INHALATION EVERY 6 HOURS Patient taking differently: Inhale 1 puff into the lungs every 6 (six) hours as needed for wheezing or shortness of breath.  12/23/17   Denita Lung, MD  nitroGLYCERIN (NITROSTAT) 0.4 MG SL tablet Place 1 tablet (0.4 mg total) under the tongue every 5 (five) minutes as needed for chest pain. 09/21/16   Hilty, Nadean Corwin, MD  NON FORMULARY at bedtime. CPAP    [provider]  OXYGEN Inhale into the lungs. Uses Oxygen at night with CPAP    [provider]    Physical Exam: Vitals:   09/09/18 1855 09/09/18 1856  BP: (!) 144/111   Pulse: (!) 104   Resp: 20   Temp: 98.9 F (37.2 C)   TempSrc: Oral   SpO2: 96%   Weight:  119.7 kg  Height:  6\' 4"  (1.93 m)    Constitutional: NAD, calm, comfortable Eyes: PERRL, lids and conjunctivae normal ENMT: Mucous membranes are moist. Posterior pharynx clear of any exudate or lesions.Normal dentition.  Neck: normal, supple, no masses, no thyromegaly Respiratory: clear to auscultation bilaterally, no wheezing, no crackles. Normal respiratory effort. No accessory muscle use.  Cardiovascular: Regular rate and rhythm, no murmurs / rubs / gallops. No extremity edema. 2+ pedal pulses. No carotid bruits.  Abdomen: no tenderness, no masses palpated. No hepatosplenomegaly. Bowel sounds positive.  Musculoskeletal: RLE shortened and externally rotated Skin: no rashes, lesions, ulcers. No induration Neurologic: CN 2-12 grossly intact. Sensation intact, DTR normal. Strength 5/5 in all 4.  Psychiatric: Normal judgment and insight. Alert and oriented x 3. Normal mood.    Labs on Admission: I have personally reviewed following labs and imaging studies  CBC: No results for input(s): WBC, NEUTROABS, HGB, HCT, MCV, PLT in the last 168 hours. Basic Metabolic Panel: No results  for input(s): NA, K, CL, CO2, GLUCOSE, BUN, CREATININE, CALCIUM, MG, PHOS in the last 168 hours. GFR: CrCl cannot be calculated (Patient's most recent lab result is older than the maximum 21 days allowed.). Liver Function Tests: No results for input(s): AST, ALT, ALKPHOS, BILITOT, PROT, ALBUMIN in the last 168 hours. No results for input(s): LIPASE, AMYLASE in the last 168 hours. No results for input(s): AMMONIA in the last 168 hours. Coagulation Profile: No results for input(s): INR, PROTIME in the last 168 hours. Cardiac Enzymes: No results for input(s): CKTOTAL, CKMB, CKMBINDEX, TROPONINI in the last 168 hours. BNP (last 3 results) No results for input(s): PROBNP in the last 8760 hours. HbA1C: No results for input(s): HGBA1C in the last 72 hours. CBG: No results for input(s): GLUCAP in the last 168  hours. Lipid Profile: No results for input(s): CHOL, HDL, LDLCALC, TRIG, CHOLHDL, LDLDIRECT in the last 72 hours. Thyroid Function Tests: No results for input(s): TSH, T4TOTAL, FREET4, T3FREE, THYROIDAB in the last 72 hours. Anemia Panel: No results for input(s): VITAMINB12, FOLATE, FERRITIN, TIBC, IRON, RETICCTPCT in the last 72 hours. Urine analysis: No results found for: COLORURINE, APPEARANCEUR, LABSPEC, PHURINE, GLUCOSEU, HGBUR, BILIRUBINUR, KETONESUR, PROTEINUR, UROBILINOGEN, NITRITE, LEUKOCYTESUR  Radiological Exams on Admission: Dg Chest Port 1 View  Result Date: 09/09/2018 CLINICAL DATA:  Preop hip surgery EXAM: PORTABLE CHEST 1 VIEW COMPARISON:  CT 10/26/2017, radiograph 09/24/2016 FINDINGS: Postsurgical changes of the cervical spine. No acute opacity or pleural effusion. Normal cardiomediastinal silhouette. No pneumothorax. IMPRESSION: No active disease. Electronically Signed   By: Donavan Foil M.D.   On: 09/09/2018 20:28   Dg Hip Unilat  With Pelvis 2-3 Views Right  Result Date: 09/09/2018 CLINICAL DATA:  Patient fell 2 hours ago. Right hip pain. EXAM: DG HIP (WITH OR  WITHOUT PELVIS) 2-3V RIGHT COMPARISON:  None. FINDINGS: Acute varus angulated transcervical fracture of the right femur. No joint dislocation. Levoconvex curvature of the lumbar spine with transitional vertebral anatomy an assimilation joint seen on right. Multilevel degenerative disc disease of the included lumbar spine is noted. No pelvic fracture or diastasis. Native left hip appears intact. IMPRESSION: 1. Acute varus angulated transcervical fracture of the right femur. 2. Lumbar spondylosis with levoscoliosis. Transitional lumbosacral vertebral anatomy with assimilation joint on the right. Electronically Signed   By: Ashley Royalty M.D.   On: 09/09/2018 19:43    EKG: Independently reviewed.  Assessment/Plan Principal Problem:   Closed right hip fracture, initial encounter (HCC) Active Problems:   OSA on CPAP   Chronic back pain   Hypertension   COPD (chronic obstructive pulmonary disease) (HCC)    1. Closed R hip fx - 1. THA tomorrow 2. Hip fx pathway 3. NPO after MN 4. Pain ctrl per pathway 5. CBC/CMP pending 2. OSA - continue CPAP QHS 3. COPD - 1. Continue home nebs 2. And O2 with CPAP at night 4. HTN - continue home BP meds 5. Chronic back pain - 1. Following with Dr. Durward Fortes, still needs MRI of L spine  DVT prophylaxis: SCDs Code Status: Full Family Communication: No family in room Disposition Plan: Home after admit Consults called: None Admission status: Admit to inpatient  Severity of Illness: The appropriate patient status for this patient is INPATIENT. Inpatient status is judged to be reasonable and necessary in order to provide the required intensity of service to ensure the patient's safety. The patient's presenting symptoms, physical exam findings, and initial radiographic and laboratory data in the context of their chronic comorbidities is felt to place them at high risk for further clinical deterioration. Furthermore, it is not anticipated that the patient will be  medically stable for discharge from the hospital within 2 midnights of admission. The following factors support the patient status of inpatient.   " The patient's presenting symptoms include R hip pain, following fall. " The worrisome physical exam findings include R hip deformity. " The initial radiographic and laboratory data are worrisome because of R Hip fx. " The chronic co-morbidities include COPD, HTN, lumbar degenerative disk disease.   * I certify that at the point of admission it is my clinical judgment that the patient will require inpatient hospital care spanning beyond 2 midnights from the point of admission due to high intensity of service, high risk for further deterioration and high frequency  of surveillance required.*    Furious Chiarelli M. DO [facility]  How to contact the [facility] Attending or Consulting provider [place] or covering provider during after hours [place], for this patient?  1. Check the care team in [facility] and look for a) attending/consulting [facility] provider listed and b) the [facility] team listed 2. Log into [email or web address]  Amion Physician Scheduling and messaging for groups and whole hospitals  On call and physician scheduling software for group practices, residents, hospitalists and other medical providers for call, clinic, rotation and shift schedules. OnCall [employer] is a hospital-wide system for scheduling doctors and paging doctors on call. EasyPlot is for scientific plotting and data analysis.  [email or web address]  and use [place]'s universal password to access. If you do not have the password, please contact the hospital operator.  3. Locate the [facility] provider you are looking for under [facility] and page to a number that you can be directly reached. 4. If you still have difficulty reaching the provider, please page the [facility] (Director on Call) for the Hospitalists listed on amion for assistance.  09/09/2018, 9:07 PM

## 2018-09-09 NOTE — Consult Note (Signed)
Reason for Consult: Right hip fracture Referring Physician:  EDP  Curtis Riley is an 84 y.o. male.  HPI: Patient is a very pleasant 84 year old gentleman who actually is followed by 1 of my partners Dr. Durward Fortes.  He was actually supposed to have an MRI of his lumbar spine tomorrow due to significant back pain with sciatica.  This evening he was getting out of his chair and getting back to his chair when he turned and fell accidentally directly onto his right hip.  He had the inability to ambulate after that was brought to the [facility] emergency room where he was found to have a displaced right hip femoral neck fracture.  He only reports right hip pain.  He currently denies any headache, chest pain, shortness of breath, fever, chills, nausea, vomiting.  He denies any previous right hip issues.  He denies any syncopal episode.  Past Medical History:  Diagnosis Date  . Angina    occasional chest pain  never been treated for this ...   . Arthritis   . BPH (benign prostatic hyperplasia)   . COPD (chronic obstructive pulmonary disease) ([place])   . CVA (cerebral infarction)   . Diverticulosis   . Dyslipidemia   . Family history of coronary artery disease    brothers, sisters  . History of nuclear stress test 03/2010   dobutamine; mild ischemia in mid inferior and apical inferior regions; ekg negative for ischemia; low risk scan   . Hypertension   . Kidney stones    10-15 yrs ago  . Neuromuscular disorder ([place])   . Shortness of breath    with exertion   . Sleep apnea    uses cpap  . Smoker     Past Surgical History:  Procedure Laterality Date  . ANTERIOR CERVICAL DECOMP/DISCECTOMY FUSION  10/06/2011   Procedure: ANTERIOR CERVICAL DECOMPRESSION/DISCECTOMY FUSION 3 LEVELS;  Surgeon: Faythe Ghee, MD;  Location: [facility];  Service: Neurosurgery;  Laterality: Bilateral;  Cervical three-four, four-five, five-six anterior cervical decompression, fusion  . CARDIAC CATHETERIZATION   10/2010   small bridging segment in LAD  . TONSILLECTOMY  age 37  . TRANSTHORACIC ECHOCARDIOGRAM  2004   normal LV size and function, aortic root dilated  . TRANSURETHRAL RESECTION OF PROSTATE  08/1996  . TRANSURETHRAL RESECTION OF PROSTATE     long time ago per pt.  10-15 yrs    Family History  Problem Relation Age of Onset  . Arthritis Mother   . Hypertension Mother   . Arthritis Father   . Hypertension Father   . Arthritis Sister   . Diabetes Sister   . Hypertension Sister   . Arthritis Brother   . Hypertension Brother     Social History:  reports that he quit smoking about 20 years ago. His smoking use included cigarettes. He has a 40.00 pack-year smoking history. He has never used smokeless tobacco. He reports that he does not drink alcohol or use drugs.  Allergies:  Allergies  Allergen Reactions  . Penicillins Anaphylaxis    Medications: I have reviewed the patient's current medications.  No results found for this or any previous visit (from the past 48 hour(s)).  Dg Chest Port 1 View  Result Date: 09/09/2018 CLINICAL DATA:  Preop hip surgery EXAM: PORTABLE CHEST 1 VIEW COMPARISON:  CT 10/26/2017, radiograph 09/24/2016 FINDINGS: Postsurgical changes of the cervical spine. No acute opacity or pleural effusion. Normal cardiomediastinal silhouette. No pneumothorax. IMPRESSION: No active disease. Electronically Signed  By: Donavan Foil M.D.   On: 09/09/2018 20:28   Dg Hip Unilat  With Pelvis 2-3 Views Right  Result Date: 09/09/2018 CLINICAL DATA:  Patient fell 2 hours ago. Right hip pain. EXAM: DG HIP (WITH OR WITHOUT PELVIS) 2-3V RIGHT COMPARISON:  None. FINDINGS: Acute varus angulated transcervical fracture of the right femur. No joint dislocation. Levoconvex curvature of the lumbar spine with transitional vertebral anatomy an assimilation joint seen on right. Multilevel degenerative disc disease of the included lumbar spine is noted. No pelvic fracture or diastasis.  Native left hip appears intact. IMPRESSION: 1. Acute varus angulated transcervical fracture of the right femur. 2. Lumbar spondylosis with levoscoliosis. Transitional lumbosacral vertebral anatomy with assimilation joint on the right. Electronically Signed   By: Ashley Royalty M.D.   On: 09/09/2018 19:43   I have independently reviewed x-rays of the right hip and pelvis and it does show a displaced right hip femoral neck fracture.  There is no previous hip disease in terms of arthritic changes.   Review of Systems  Musculoskeletal: Positive for back pain and joint pain.  All other systems reviewed and are negative.  Blood pressure (!) 144/111, pulse (!) 104, temperature 98.9 F (37.2 C), temperature source Oral, resp. rate 20, height 6\' 4"  (1.93 m), weight 119.7 kg, SpO2 96 %. Physical Exam  Constitutional: He is oriented to person, place, and time. He appears well-developed and well-nourished.  HENT:  Head: Normocephalic and atraumatic.  Eyes: Pupils are equal, round, and reactive to light. EOM are normal.  Neck: Normal range of motion. Neck supple.  Cardiovascular: Normal rate.  Respiratory: Effort normal.  GI: Soft.  Musculoskeletal:     Right hip: He exhibits decreased range of motion, decreased strength, tenderness, bony tenderness and deformity.  Neurological: He is alert and oriented to person, place, and time.  Skin: Skin is warm and dry.  Psychiatric: He has a normal mood and affect.   The right leg is shortened and externally rotated  Assessment/Plan: Acute displaced right hip femoral neck fracture  I talked to the patient in length in detail.  He understands fully that he has a fracture of his right hip.  I am recommending a direct anterior right total hip arthroplasty.  This is given the fact that he is very active at 73 and is a regular community ambulator.  We talked about options of surgery.  I do feel that he would do better with a total hip as opposed to a partial hip or  hip hemiarthroplasty.  We talked about the risk and benefits of surgery in detail.  I have plan for surgery in the afternoon tomorrow.  He will be seen in likely graciously admitted by [facility] for medical management.  Mcarthur Rossetti 09/09/2018, 8:40 PM

## 2018-09-10 ENCOUNTER — Inpatient Hospital Stay (HOSPITAL_COMMUNITY): Payer: Medicare Other

## 2018-09-10 ENCOUNTER — Inpatient Hospital Stay (HOSPITAL_COMMUNITY): Payer: Medicare Other | Admitting: Certified Registered Nurse Anesthetist

## 2018-09-10 ENCOUNTER — Encounter (HOSPITAL_COMMUNITY): Payer: Self-pay | Admitting: *Deleted

## 2018-09-10 ENCOUNTER — Encounter (HOSPITAL_COMMUNITY)
Admission: EM | Disposition: A | Discharge status: Skilled Nursing Facility | Payer: Self-pay | Source: Home / Self Care | Attending: Student

## 2018-09-10 DIAGNOSIS — S72041A Displaced fracture of base of neck of right femur, initial encounter for closed fracture: Secondary | ICD-10-CM

## 2018-09-10 DIAGNOSIS — N183 Chronic kidney disease, stage 3 unspecified: Secondary | ICD-10-CM | POA: Diagnosis present

## 2018-09-10 HISTORY — PX: TOTAL HIP ARTHROPLASTY: SHX124

## 2018-09-10 LAB — ABO/RH: ABO/RH(D): O POS

## 2018-09-10 LAB — SURGICAL PCR SCREEN
MRSA, PCR: NEGATIVE
STAPHYLOCOCCUS AUREUS: NEGATIVE

## 2018-09-10 SURGERY — ARTHROPLASTY, HIP, TOTAL, ANTERIOR APPROACH
Anesthesia: General | Laterality: Right

## 2018-09-10 MED ORDER — IPRATROPIUM-ALBUTEROL 0.5-2.5 (3) MG/3ML IN SOLN
3.0000 mL | Freq: Two times a day (BID) | RESPIRATORY_TRACT | Status: DC
  Administered 2018-09-10 – 2018-09-14 (×8): 3 mL via RESPIRATORY_TRACT
  Filled 2018-09-10 (×8): qty 3

## 2018-09-10 MED ORDER — ONDANSETRON HCL 4 MG/2ML IJ SOLN
INTRAMUSCULAR | Status: AC
  Filled 2018-09-10: qty 2

## 2018-09-10 MED ORDER — PANTOPRAZOLE SODIUM 40 MG PO TBEC
40.0000 mg | DELAYED_RELEASE_TABLET | Freq: Every day | ORAL | Status: DC
  Administered 2018-09-10 – 2018-09-14 (×5): 40 mg via ORAL
  Filled 2018-09-10 (×5): qty 1

## 2018-09-10 MED ORDER — FENTANYL CITRATE (PF) 250 MCG/5ML IJ SOLN
INTRAMUSCULAR | Status: AC
  Filled 2018-09-10: qty 5

## 2018-09-10 MED ORDER — METOCLOPRAMIDE HCL 5 MG PO TABS: 5.0000 mg | ORAL_TABLET | Freq: Three times a day (TID) | ORAL | Status: DC | PRN

## 2018-09-10 MED ORDER — VANCOMYCIN HCL 1000 MG IV SOLR
INTRAVENOUS | Status: DC | PRN
  Administered 2018-09-10: 1000 mg via INTRAVENOUS

## 2018-09-10 MED ORDER — OXYCODONE HCL 5 MG PO TABS
10.0000 mg | ORAL_TABLET | ORAL | Status: DC | PRN
  Administered 2018-09-11: 10 mg via ORAL
  Filled 2018-09-10: qty 2

## 2018-09-10 MED ORDER — PHENYLEPHRINE 40 MCG/ML (10ML) SYRINGE FOR IV PUSH (FOR BLOOD PRESSURE SUPPORT)
PREFILLED_SYRINGE | INTRAVENOUS | Status: DC | PRN
  Administered 2018-09-10: 120 ug via INTRAVENOUS
  Administered 2018-09-10: 80 ug via INTRAVENOUS
  Administered 2018-09-10: 120 ug via INTRAVENOUS
  Administered 2018-09-10 (×6): 80 ug via INTRAVENOUS
  Administered 2018-09-10: 120 ug via INTRAVENOUS
  Administered 2018-09-10: 80 ug via INTRAVENOUS

## 2018-09-10 MED ORDER — PROPOFOL 10 MG/ML IV BOLUS
INTRAVENOUS | Status: DC | PRN
  Administered 2018-09-10: 130 mg via INTRAVENOUS

## 2018-09-10 MED ORDER — LACTATED RINGERS IV SOLN: INTRAVENOUS | Status: DC

## 2018-09-10 MED ORDER — ALBUTEROL SULFATE HFA 108 (90 BASE) MCG/ACT IN AERS
INHALATION_SPRAY | RESPIRATORY_TRACT | Status: DC | PRN
  Administered 2018-09-10: 5 via RESPIRATORY_TRACT

## 2018-09-10 MED ORDER — ACETAMINOPHEN 325 MG PO TABS: 325.0000 mg | ORAL_TABLET | Freq: Once | ORAL | Status: DC

## 2018-09-10 MED ORDER — SUGAMMADEX SODIUM 200 MG/2ML IV SOLN
INTRAVENOUS | Status: AC
  Filled 2018-09-10: qty 2

## 2018-09-10 MED ORDER — OXYCODONE HCL 5 MG PO TABS
5.0000 mg | ORAL_TABLET | ORAL | Status: DC | PRN
  Administered 2018-09-10: 5 mg via ORAL
  Filled 2018-09-10: qty 1
  Filled 2018-09-10: qty 2

## 2018-09-10 MED ORDER — DEXAMETHASONE SODIUM PHOSPHATE 4 MG/ML IJ SOLN
INTRAMUSCULAR | Status: DC | PRN
  Administered 2018-09-10: 5 mg via INTRAVENOUS

## 2018-09-10 MED ORDER — VANCOMYCIN HCL IN DEXTROSE 1-5 GM/200ML-% IV SOLN
1000.0000 mg | Freq: Two times a day (BID) | INTRAVENOUS | Status: AC
  Administered 2018-09-11: 1000 mg via INTRAVENOUS
  Filled 2018-09-10: qty 200

## 2018-09-10 MED ORDER — EPHEDRINE SULFATE-NACL 50-0.9 MG/10ML-% IV SOSY
PREFILLED_SYRINGE | INTRAVENOUS | Status: DC | PRN
  Administered 2018-09-10 (×2): 5 mg via INTRAVENOUS
  Administered 2018-09-10: 10 mg via INTRAVENOUS

## 2018-09-10 MED ORDER — FENTANYL CITRATE (PF) 100 MCG/2ML IJ SOLN: 25.0000 ug | INTRAMUSCULAR | Status: DC | PRN

## 2018-09-10 MED ORDER — ONDANSETRON HCL 4 MG/2ML IJ SOLN
INTRAMUSCULAR | Status: DC | PRN
  Administered 2018-09-10: 4 mg via INTRAVENOUS

## 2018-09-10 MED ORDER — ACETAMINOPHEN 160 MG/5ML PO SOLN: 325.0000 mg | Freq: Once | ORAL | Status: DC

## 2018-09-10 MED ORDER — ONDANSETRON HCL 4 MG PO TABS: 4.0000 mg | ORAL_TABLET | Freq: Four times a day (QID) | ORAL | Status: DC | PRN

## 2018-09-10 MED ORDER — ONDANSETRON HCL 4 MG/2ML IJ SOLN: 4.0000 mg | Freq: Four times a day (QID) | INTRAMUSCULAR | Status: DC | PRN

## 2018-09-10 MED ORDER — MEPERIDINE HCL 50 MG/ML IJ SOLN: 6.2500 mg | INTRAMUSCULAR | Status: DC | PRN

## 2018-09-10 MED ORDER — METHOCARBAMOL 500 MG IVPB - SIMPLE MED
500.0000 mg | Freq: Four times a day (QID) | INTRAVENOUS | Status: DC | PRN
  Filled 2018-09-10: qty 50

## 2018-09-10 MED ORDER — PHENYLEPHRINE 40 MCG/ML (10ML) SYRINGE FOR IV PUSH (FOR BLOOD PRESSURE SUPPORT)
PREFILLED_SYRINGE | INTRAVENOUS | Status: AC
  Filled 2018-09-10: qty 10

## 2018-09-10 MED ORDER — DEXAMETHASONE SODIUM PHOSPHATE 10 MG/ML IJ SOLN
INTRAMUSCULAR | Status: AC
  Filled 2018-09-10: qty 1

## 2018-09-10 MED ORDER — HYDROMORPHONE HCL 1 MG/ML IJ SOLN: 0.5000 mg | INTRAMUSCULAR | Status: DC | PRN

## 2018-09-10 MED ORDER — SODIUM CHLORIDE 0.9 % IV SOLN
INTRAVENOUS | Status: DC
  Administered 2018-09-10: 17:00:00 via INTRAVENOUS

## 2018-09-10 MED ORDER — ALUM & MAG HYDROXIDE-SIMETH 200-200-20 MG/5ML PO SUSP: 30.0000 mL | ORAL | Status: DC | PRN

## 2018-09-10 MED ORDER — FENTANYL CITRATE (PF) 100 MCG/2ML IJ SOLN
INTRAMUSCULAR | Status: DC | PRN
  Administered 2018-09-10 (×5): 50 ug via INTRAVENOUS

## 2018-09-10 MED ORDER — METHOCARBAMOL 500 MG PO TABS
500.0000 mg | ORAL_TABLET | Freq: Four times a day (QID) | ORAL | Status: DC | PRN
  Administered 2018-09-11: 500 mg via ORAL
  Filled 2018-09-10 (×4): qty 1

## 2018-09-10 MED ORDER — ACETAMINOPHEN 325 MG PO TABS
325.0000 mg | ORAL_TABLET | Freq: Four times a day (QID) | ORAL | Status: DC | PRN
  Administered 2018-09-10 – 2018-09-13 (×2): 650 mg via ORAL
  Filled 2018-09-10 (×3): qty 2

## 2018-09-10 MED ORDER — PHENOL 1.4 % MT LIQD: 1.0000 | OROMUCOSAL | Status: DC | PRN

## 2018-09-10 MED ORDER — LACTATED RINGERS IV SOLN
INTRAVENOUS | Status: DC | PRN
  Administered 2018-09-10 (×2): via INTRAVENOUS

## 2018-09-10 MED ORDER — METOCLOPRAMIDE HCL 5 MG/ML IJ SOLN: 5.0000 mg | Freq: Three times a day (TID) | INTRAMUSCULAR | Status: DC | PRN

## 2018-09-10 MED ORDER — LIDOCAINE 2% (20 MG/ML) 5 ML SYRINGE
INTRAMUSCULAR | Status: AC
  Filled 2018-09-10: qty 5

## 2018-09-10 MED ORDER — DOCUSATE SODIUM 100 MG PO CAPS
100.0000 mg | ORAL_CAPSULE | Freq: Two times a day (BID) | ORAL | Status: DC
  Administered 2018-09-10 – 2018-09-14 (×8): 100 mg via ORAL
  Filled 2018-09-10 (×8): qty 1

## 2018-09-10 MED ORDER — ACETAMINOPHEN 10 MG/ML IV SOLN: 1000.0000 mg | Freq: Once | INTRAVENOUS | Status: DC | PRN

## 2018-09-10 MED ORDER — STERILE WATER FOR IRRIGATION IR SOLN
Status: DC | PRN
  Administered 2018-09-10: 2000 mL

## 2018-09-10 MED ORDER — ALBUTEROL SULFATE HFA 108 (90 BASE) MCG/ACT IN AERS
INHALATION_SPRAY | RESPIRATORY_TRACT | Status: AC
  Filled 2018-09-10: qty 6.7

## 2018-09-10 MED ORDER — VANCOMYCIN HCL IN DEXTROSE 1-5 GM/200ML-% IV SOLN
INTRAVENOUS | Status: AC
  Filled 2018-09-10: qty 200

## 2018-09-10 MED ORDER — PROPOFOL 10 MG/ML IV BOLUS
INTRAVENOUS | Status: AC
  Filled 2018-09-10: qty 20

## 2018-09-10 MED ORDER — METHOCARBAMOL 500 MG IVPB - SIMPLE MED
INTRAVENOUS | Status: AC
  Administered 2018-09-10: 500 mg
  Filled 2018-09-10: qty 50

## 2018-09-10 MED ORDER — SODIUM CHLORIDE 0.9 % IR SOLN
Status: DC | PRN
  Administered 2018-09-10: 1000 mL

## 2018-09-10 MED ORDER — ASPIRIN 81 MG PO CHEW
81.0000 mg | CHEWABLE_TABLET | Freq: Two times a day (BID) | ORAL | Status: DC
  Administered 2018-09-10 – 2018-09-14 (×8): 81 mg via ORAL
  Filled 2018-09-10 (×8): qty 1

## 2018-09-10 MED ORDER — ROCURONIUM BROMIDE 100 MG/10ML IV SOLN
INTRAVENOUS | Status: AC
  Filled 2018-09-10: qty 1

## 2018-09-10 MED ORDER — ROCURONIUM BROMIDE 10 MG/ML (PF) SYRINGE
PREFILLED_SYRINGE | INTRAVENOUS | Status: DC | PRN
  Administered 2018-09-10: 50 mg via INTRAVENOUS

## 2018-09-10 MED ORDER — LIDOCAINE 2% (20 MG/ML) 5 ML SYRINGE
INTRAMUSCULAR | Status: DC | PRN
  Administered 2018-09-10: 60 mg via INTRAVENOUS

## 2018-09-10 MED ORDER — SUGAMMADEX SODIUM 200 MG/2ML IV SOLN
INTRAVENOUS | Status: DC | PRN
  Administered 2018-09-10: 200 mg via INTRAVENOUS

## 2018-09-10 MED ORDER — MENTHOL 3 MG MT LOZG: 1.0000 | LOZENGE | OROMUCOSAL | Status: DC | PRN

## 2018-09-10 SURGICAL SUPPLY — 41 items
APL SKNCLS STERI-STRIP NONHPOA (GAUZE/BANDAGES/DRESSINGS)
BAG SPEC THK2 15X12 ZIP CLS (MISCELLANEOUS)
BAG ZIPLOCK 12X15 (MISCELLANEOUS) IMPLANT
BALL HIP ARTICU EZE 36 8.5 (Hips) IMPLANT
BENZOIN TINCTURE PRP APPL 2/3 (GAUZE/BANDAGES/DRESSINGS) IMPLANT
BLADE SAW SGTL 18X1.27X75 (BLADE) ×2 IMPLANT
BLADE SURG SZ10 CARB STEEL (BLADE) ×4 IMPLANT
COVER PERINEAL POST (MISCELLANEOUS) ×2 IMPLANT
COVER SURGICAL LIGHT HANDLE (MISCELLANEOUS) ×2 IMPLANT
COVER WAND RF STERILE (DRAPES) IMPLANT
CUP ACET PINNACLE SECTR 60MM (Hips) IMPLANT
DRAPE STERI IOBAN 125X83 (DRAPES) ×2 IMPLANT
DRAPE U-SHAPE 47X51 STRL (DRAPES) ×4 IMPLANT
DRSG AQUACEL AG ADV 3.5X10 (GAUZE/BANDAGES/DRESSINGS) ×2 IMPLANT
DURAPREP 26ML APPLICATOR (WOUND CARE) ×2 IMPLANT
ELECT REM PT RETURN 15FT ADLT (MISCELLANEOUS) ×2 IMPLANT
GAUZE XEROFORM 1X8 LF (GAUZE/BANDAGES/DRESSINGS) ×1 IMPLANT
GLOVE BIO SURGEON STRL SZ7.5 (GLOVE) ×2 IMPLANT
GLOVE BIOGEL PI IND STRL 8 (GLOVE) ×2 IMPLANT
GLOVE BIOGEL PI INDICATOR 8 (GLOVE) ×2
GLOVE ECLIPSE 8.0 STRL XLNG CF (GLOVE) ×2 IMPLANT
GOWN STRL REUS W/TWL XL LVL3 (GOWN DISPOSABLE) ×4 IMPLANT
HANDPIECE INTERPULSE COAX TIP (DISPOSABLE) ×2
HIP BALL ARTICU EZE 36 8.5 (Hips) ×2 IMPLANT
HOLDER FOLEY CATH W/STRAP (MISCELLANEOUS) ×2 IMPLANT
LINER PINN ALTRX ACTABR 36X60 (Liner) IMPLANT
LINER PINNACLE ALTRAX ACTABULR (Liner) ×2 IMPLANT
PACK ANTERIOR HIP CUSTOM (KITS) ×2 IMPLANT
PINNSECTOR W/GRIP ACE CUP 60MM (Hips) ×2 IMPLANT
SET HNDPC FAN SPRY TIP SCT (DISPOSABLE) ×1 IMPLANT
STAPLER VISISTAT 35W (STAPLE) ×1 IMPLANT
STEM CORAIL HC SZ14 135 (Stem) ×1 IMPLANT
STRIP CLOSURE SKIN 1/2X4 (GAUZE/BANDAGES/DRESSINGS) IMPLANT
SUT ETHIBOND NAB CT1 #1 30IN (SUTURE) ×2 IMPLANT
SUT MNCRL AB 4-0 PS2 18 (SUTURE) ×1 IMPLANT
SUT VIC AB 0 CT1 36 (SUTURE) ×2 IMPLANT
SUT VIC AB 1 CT1 36 (SUTURE) ×2 IMPLANT
SUT VIC AB 2-0 CT1 27 (SUTURE) ×4
SUT VIC AB 2-0 CT1 TAPERPNT 27 (SUTURE) ×2 IMPLANT
TRAY FOLEY MTR SLVR 16FR STAT (SET/KITS/TRAYS/PACK) ×2 IMPLANT
YANKAUER SUCT BULB TIP 10FT TU (MISCELLANEOUS) ×2 IMPLANT

## 2018-09-10 NOTE — Anesthesia Preprocedure Evaluation (Addendum)
Anesthesia Evaluation  Patient identified by MRN, date of birth, ID band Patient awake    Reviewed: Allergy & Precautions, NPO status , Patient's Chart, lab work & pertinent test results  Airway Mallampati: I  TM Distance: >3 FB Neck ROM: Full    Dental  (+) Missing, Poor Dentition, Chipped, Dental Advisory Given,    Pulmonary sleep apnea and Continuous Positive Airway Pressure Ventilation , COPD,  COPD inhaler, former smoker,     + decreased breath sounds      Cardiovascular hypertension, Pt. on home beta blockers and Pt. on medications +CHF   Rhythm:Regular Rate:Normal     Neuro/Psych  Neuromuscular disease    GI/Hepatic negative GI ROS, Neg liver ROS,   Endo/Other  negative endocrine ROS  Renal/GU      Musculoskeletal  (+) Arthritis ,   Abdominal Normal abdominal exam  (+)   Peds  Hematology negative hematology ROS (+)   Anesthesia Other Findings   Reproductive/Obstetrics                            Lab Results  Component Value Date   WBC 9.3 09/09/2018   HGB 13.3 09/09/2018   HCT 43.0 09/09/2018   MCV 100.0 09/09/2018   PLT 197 09/09/2018   Lab Results  Component Value Date   CREATININE 1.56 (H) 09/09/2018   BUN 17 09/09/2018   NA 140 09/09/2018   K 5.1 09/09/2018   CL 108 09/09/2018   CO2 24 09/09/2018   Lab Results  Component Value Date   INR 1.07 09/09/2018   Gated Myoview:  The left ventricular ejection fraction is mildly decreased (45-54%).  Nuclear stress EF: 48%.  Defect 1: There is a medium defect of moderate severity present in the apical septal, apical inferior and apex location. This is likely due to chest wall attenuation.  Defect 2: There is a medium defect of moderate severity present in the basal inferior and mid inferior location. This is likely due to diaphrgmatic attenuation .  The study is normal. This is a low risk study.  Echo: - Left  ventricle: The cavity size was normal. Wall thickness was   increased in a pattern of mild LVH. Systolic function was normal.   The estimated ejection fraction was in the range of 55% to 60%.   Incoordinate septal motion. Definity contrast was administered-   no mural thrombus was identified.  Anesthesia Physical Anesthesia Plan  ASA: III  Anesthesia Plan: General   Post-op Pain Management:    Induction: Intravenous  PONV Risk Score and Plan: 3 and Ondansetron, Dexamethasone and Treatment may vary due to age or medical condition  Airway Management Planned: Oral ETT  Additional Equipment: None  Intra-op Plan:   Post-operative Plan: Extubation in OR  Informed Consent: I have reviewed the patients History and Physical, chart, labs and discussed the procedure including the risks, benefits and alternatives for the proposed anesthesia with the patient or authorized representative who has indicated his/her understanding and acceptance.     Dental advisory given  Plan Discussed with: CRNA  Anesthesia Plan Comments:        Anesthesia Quick Evaluation

## 2018-09-10 NOTE — Anesthesia Postprocedure Evaluation (Signed)
Anesthesia Post Note  Patient: Curtis Riley  Procedure(s) Performed: TOTAL HIP ARTHROPLASTY ANTERIOR APPROACH (Right )     Patient location during evaluation: PACU Anesthesia Type: General Level of consciousness: awake and alert Pain management: pain level controlled Vital Signs Assessment: post-procedure vital signs reviewed and stable Respiratory status: spontaneous breathing, nonlabored ventilation, respiratory function stable and patient connected to nasal cannula oxygen Cardiovascular status: blood pressure returned to baseline and stable Postop Assessment: no apparent nausea or vomiting Anesthetic complications: no    Last Vitals:  Vitals:   09/10/18 1635 09/10/18 1739  BP: 123/75 131/89  Pulse: 73 79  Resp: 20 16  Temp: 36.6 C (!) 36.3 C  SpO2: 95% 98%    Last Pain:  Vitals:   09/10/18 1635  TempSrc: Oral  PainSc:                  Effie Berkshire

## 2018-09-10 NOTE — Anesthesia Procedure Notes (Signed)
Procedure Name: Intubation Date/Time: 09/10/2018 1:32 PM Performed by: Claudia Desanctis, CRNA Pre-anesthesia Checklist: Patient identified, Emergency Drugs available, Suction available and Patient being monitored Patient Re-evaluated:Patient Re-evaluated prior to induction Oxygen Delivery Method: Circle system utilized Preoxygenation: Pre-oxygenation with 100% oxygen Induction Type: IV induction Ventilation: Mask ventilation without difficulty Laryngoscope Size: 2 and Miller Grade View: Grade I Tube type: Oral Number of attempts: 1 Airway Equipment and Method: Stylet Placement Confirmation: ETT inserted through vocal cords under direct vision,  positive ETCO2 and breath sounds checked- equal and bilateral Secured at: 24 cm Tube secured with: Tape Dental Injury: Teeth and Oropharynx as per pre-operative assessment

## 2018-09-10 NOTE — Progress Notes (Signed)
PROGRESS NOTE  Curtis Riley ASN:[id number] DOB: 07/11/1935 DOA: 09/09/2018 PCP: Denita Lung, MD   LOS: 1 day   Brief Narrative / Interim history: 84 year old male with history of COPD, OSA on CPAP, hypertension, CKD 3 and chronic back pain with radiculopathy admitted after mechanical fall and subsequent right hip fracture.  Patient reports 3 incidents of falls in the last 1 month with unsteady gait.  No prodromes or cardiopulmonary symptoms.  Followed by orthopedic surgery.  Plan was to get MRI lumbar spine on 2/23.  He denies fever, unintentional weight loss, fecal incontinence or urinary retention.  In ED, vitals, CBC and CMP not impressive.  EKG sinus rhythm with RAD and LAFB.  Chest x-ray not impressive.  Right hip x-ray showed acute varus angulated transcervical fracture of the right femur and some arthritic changes of lumbosacral vertebras. Orthopedic surgery consulted and planning to take patient to the OR  Subjective: No major events overnight of this morning.  No complaint this morning.  Says he has not been asking for pain medication.  Denies chest pain, shortness of breath or palpitation.  Denies history of cardiac disease.  Assessment & Plan: Principal Problem:   Closed right hip fracture, initial encounter ([place]) Active Problems:   OSA on CPAP   Chronic back pain   Hypertension   COPD (chronic obstructive pulmonary disease) (HCC)  Mechanical fall Closed right hip fracture Chronic back pain/radiculopathy/unsteady gait -Plan for THA by orthopedic surgery today.  No cardiac risk factor -Pain control -SCD for VT prophylaxis -We will obtain MRI lumbar spine after surgery.  Hypertension: Normotensive -Continue home meds  CKD 3: Stable -Continue monitoring  OSA -Nightly CPAP  Scheduled Meds: . carvedilol  25 mg Oral BID  . lisinopril  40 mg Oral Daily   Continuous Infusions: PRN Meds:.HYDROcodone-acetaminophen, ipratropium-albuterol, morphine  injection  DVT prophylaxis: SCD Code Status: Full code Family Communication: None at bedside Disposition Plan: Remains inpatient  Consultants:   Orthopedic surgery  Procedures:   THA 2/22  Antimicrobials:  Cefazolin perioperatively  Objective: Vitals:   09/09/18 2323 09/09/18 2351 09/10/18 0157 09/10/18 0505  BP:   118/80 (!) 144/84  Pulse:   91 96  Resp:  18 18 14   Temp:   98.3 F (36.8 C) 98.8 F (37.1 C)  TempSrc:   Oral Oral  SpO2:   98% 100%  Weight: 103.5 kg     Height: 6\' 4"  (1.93 m)       Intake/Output Summary (Last 24 hours) at 09/10/2018 0939 Last data filed at 09/10/2018 0525 Gross per 24 hour  Intake 200 ml  Output 625 ml  Net -425 ml   Filed Weights   09/09/18 1856 09/09/18 2323  Weight: 119.7 kg 103.5 kg    Examination:  GENERAL: Appears well. No acute distress.  EYES - vision grossly intact. Sclera anicteric.  NOSE- no gross deformity or drainage MOUTH - no oral lesions noted THROAT- no swelling or erythema LUNGS:  No IWOB. Good air movement. CTAB.  HEART: RRR. Heart sounds normal. ABD: Bowel sounds present. Soft. Non tender.  MSK/EXT: No obvious deformity.  RLE externally rotated.  Venous insufficiency SKIN: no apparent skin lesion.  Venous insufficiency NEURO: Awake, alert and oriented appropriately.  No gross deficit.  PSYCH: Calm. Normal affect.   Data Reviewed: I have independently reviewed following labs and imaging studies  CBC: Recent Labs  Lab 09/09/18 2108  WBC 9.3  NEUTROABS 7.7  HGB 13.3  HCT 43.0  MCV 100.0  PLT 562   Basic Metabolic Panel: Recent Labs  Lab 09/09/18 2108  NA 140  K 5.1  CL 108  CO2 24  GLUCOSE 83  BUN 17  CREATININE 1.56*  CALCIUM 9.2   GFR: Estimated Creatinine Clearance: 45.6 mL/min (A) (by C-G formula based on SCr of 1.56 mg/dL (H)). Liver Function Tests: Recent Labs  Lab 09/09/18 2108  AST 36  ALT 19  ALKPHOS 74  BILITOT 1.6*  PROT 6.9  ALBUMIN 3.7   No results for  input(s): LIPASE, AMYLASE in the last 168 hours. No results for input(s): AMMONIA in the last 168 hours. Coagulation Profile: Recent Labs  Lab 09/09/18 2108  INR 1.07   Cardiac Enzymes: No results for input(s): CKTOTAL, CKMB, CKMBINDEX, TROPONINI in the last 168 hours. BNP (last 3 results) No results for input(s): PROBNP in the last 8760 hours. HbA1C: No results for input(s): HGBA1C in the last 72 hours. CBG: No results for input(s): GLUCAP in the last 168 hours. Lipid Profile: No results for input(s): CHOL, HDL, LDLCALC, TRIG, CHOLHDL, LDLDIRECT in the last 72 hours. Thyroid Function Tests: No results for input(s): TSH, T4TOTAL, FREET4, T3FREE, THYROIDAB in the last 72 hours. Anemia Panel: No results for input(s): VITAMINB12, FOLATE, FERRITIN, TIBC, IRON, RETICCTPCT in the last 72 hours. Urine analysis: No results found for: COLORURINE, APPEARANCEUR, LABSPEC, PHURINE, GLUCOSEU, HGBUR, BILIRUBINUR, KETONESUR, PROTEINUR, UROBILINOGEN, NITRITE, LEUKOCYTESUR Sepsis Labs: Invalid input(s): PROCALCITONIN, LACTICIDVEN  Recent Results (from the past 240 hour(s))  Surgical pcr screen     Status: None   Collection Time: 09/09/18 10:25 PM  Result Value Ref Range Status   MRSA, PCR NEGATIVE NEGATIVE Final   Staphylococcus aureus NEGATIVE NEGATIVE Final    Comment: (NOTE) The Xpert SA Assay (FDA approved for NASAL specimens in patients 74 years of age and older), is one component of a comprehensive surveillance program. It is not intended to diagnose infection nor to guide or monitor treatment. Performed at [facility], [place] [address] [place], [place] [address]       Radiology Studies: Dg Chest Port 1 View  Result Date: 09/09/2018 CLINICAL DATA:  Preop hip surgery EXAM: PORTABLE CHEST 1 VIEW COMPARISON:  CT 10/26/2017, radiograph 09/24/2016 FINDINGS: Postsurgical changes of the cervical spine. No acute opacity or pleural effusion. Normal cardiomediastinal  silhouette. No pneumothorax. IMPRESSION: No active disease. Electronically Signed   By: Donavan Foil M.D.   On: 09/09/2018 20:28   Dg Hip Unilat  With Pelvis 2-3 Views Right  Result Date: 09/09/2018 CLINICAL DATA:  Patient fell 2 hours ago. Right hip pain. EXAM: DG HIP (WITH OR WITHOUT PELVIS) 2-3V RIGHT COMPARISON:  None. FINDINGS: Acute varus angulated transcervical fracture of the right femur. No joint dislocation. Levoconvex curvature of the lumbar spine with transitional vertebral anatomy an assimilation joint seen on right. Multilevel degenerative disc disease of the included lumbar spine is noted. No pelvic fracture or diastasis. Native left hip appears intact. IMPRESSION: 1. Acute varus angulated transcervical fracture of the right femur. 2. Lumbar spondylosis with levoscoliosis. Transitional lumbosacral vertebral anatomy with assimilation joint on the right. Electronically Signed   By: Ashley Royalty M.D.   On: 09/09/2018 19:43    Nakeeta Sebastiani T. [facility] Pager [phone]  If 7PM-7AM, please contact night-coverage [email or web address] Password [facility] 09/10/2018, 9:39 AM

## 2018-09-10 NOTE — Progress Notes (Signed)
Patient ID: Curtis Riley, male   DOB: [birth year], 84 y.o.   MRN: [id number] The patient understands fully that he has a right hip fracture and we are recommending a right total hip replacement to address this injury.  The risks and benefits have been discussed in detail and informed consent is obtained.

## 2018-09-10 NOTE — Op Note (Signed)
NAME: Curtis Riley, Curtis Riley MEDICAL RECORD [id number] ACCOUNT [id number] DATE OF BIRTH:1935-04-02 FACILITY: [facility] LOCATION: [facility] PHYSICIAN:Mavric Cortright [place], MD  OPERATIVE REPORT  DATE OF PROCEDURE:  09/10/2018  PREOPERATIVE DIAGNOSIS:  Displaced right hip femoral neck fracture.  POSTOPERATIVE DIAGNOSIS:  Displaced right hip femoral neck fracture.  PROCEDURE:  Right total hip arthroplasty through direct anterior approach.  IMPLANTS:  DePuy Sector Gription acetabular component size 60, size 36+0 polyethylene liner, size 14 Corail femoral component with high offset, size 36+8.5 metal hip ball.  SURGEON:  Lind Guest. Ninfa Linden, MD  ASSISTANT:  [place] staff.  ANTIBIOTICS:  One gram IV vancomycin.  ESTIMATED BLOOD LOSS:  600 mL.  COMPLICATIONS:  None.  INDICATIONS:  The patient is a very pleasant 84 year old gentleman who is actually 6 foot 4 and a solid guy.  He fell yesterday at home accidentally landing on his right hip and sustaining a displaced right hip femoral neck fracture.  He is someone who  does ambulate regularly in the community.  Given the displaced nature of this fracture, I recommended a direct anterior hip replacement surgery instead of a hemiarthroplasty given the fact that he is quite mobile and active.  I talked to him in detail  about the risks and benefits of surgery including the risk of acute blood loss anemia, nerve or vessel injury, fracture, infection, DVT, dislocation, implant failure.  We talked about the goals of decreased pain, improved mobility and overall improved  quality of life.  DESCRIPTION OF PROCEDURE:  After informed consent was obtained and appropriate right hip was marked, he was brought to the operating room and general anesthesia was obtained while he was on a stretcher.  Foley catheter was placed, and traction boots were  placed on both his feet.  Next, he was placed supine on the Hana fracture table, the perineal post in  place and both legs in in-line skeletal traction device and no traction applied.  His right operative hip was prepped and draped with DuraPrep and  sterile drapes.  A time-out was called.  He was identified as correct patient, correct right hip.  We then made an incision just inferior and posterior to the anterior iliac spine and carried this obliquely down the leg, dissected down tensor fascia lata  muscle.  Tensor fascia was then divided longitudinally to proceed with direct anterior approach to the hip.  I identified and cauterized circumflex vessels and identified the hip capsule, opened up the capsule in an L-type format, finding moderate joint  effusion from a hematoma and an obvious displaced femoral neck fracture.  We placed Cobra retractors around the medial and lateral remnants of the femoral neck and then made our femoral neck freshening cut with an oscillating saw just proximal to the  lesser trochanter and completed this with an osteotome.  We placed a corkscrew guide in the femoral head and removed the femoral head in its entirety.  I then cleaned the acetabulum of remnants of bone and other debris.  We removed remnants of the  acetabular labrum and placed a bent Hohmann over the medial acetabular rim and then began reaming from a size 44 reamer.  Then we jumped increments and eventually we ended up with a size 59 reamer with all reamers under direct visualization, the last  reamer under direct fluoroscopy, so I could obtain our depth of reaming, our inclination and anteversion.  I then placed the real DePuy Sector Gription acetabular component size 60 and a 36+4 polyethylene liner for  that size acetabular component.   Attention was then turned to the femur.  With the leg externally rotated to 120 degrees, extended and adducted, we placed the Mueller retractor medially and Hohmann retractor above the greater trochanter, released lateral joint capsule and used a  box-cutting osteotome to enter  the femoral canal and a rongeur to lateralize.  We then began broaching from a size 8 broach using Corail broaching system going all the way to size 14.  With the 14 in place, we trialed a standard offset femoral neck and a  36+1.5 hip ball.  We definitely needed more leg length and offset.  We dislocated the hip and removed the trial components.  I then went with the real Corail femoral component size 14, but I went with a high offset stem and then we went with a 36+8.5  hip ball.  We reduced this in the acetabulum, and I was pleased with leg length, offset, range of motion and stability.  We then irrigated the soft tissue with normal saline solution.  We closed the joint capsule with interrupted #1 Ethibond suture,  followed by running 0 Vicryl in the tensor fascia, 0 Vicryl in the deep tissue, 2-0 Vicryl subcutaneous tissue and interrupted staples on the skin.  Xeroform and an Aquacel dressing was applied.  He was taken off the Hana table, awakened, extubated, and  taken to recovery room in stable condition.  All final counts were correct.  There were no complications noted.  LN/NUANCE  D:09/10/2018 T:09/10/2018 JOB:[id number]

## 2018-09-10 NOTE — Transfer of Care (Signed)
Immediate Anesthesia Transfer of Care Note  Patient: Curtis Riley  Procedure(s) Performed: TOTAL HIP ARTHROPLASTY ANTERIOR APPROACH (Right )  Patient Location: PACU  Anesthesia Type:General  Level of Consciousness: drowsy and patient cooperative  Airway & Oxygen Therapy: Patient Spontanous Breathing and Patient connected to face mask  Post-op Assessment: Report given to RN and Post -op Vital signs reviewed and stable  Post vital signs: Reviewed and stable  Last Vitals:  Vitals Value Taken Time  BP 113/65 09/10/2018  3:08 PM  Temp    Pulse 74 09/10/2018  3:09 PM  Resp 8 09/10/2018  3:09 PM  SpO2 100 % 09/10/2018  3:09 PM  Vitals shown include unvalidated device data.  Last Pain:  Vitals:   09/10/18 1042  TempSrc:   PainSc: 6       Patients Stated Pain Goal: 2 (32/35/57 3220)  Complications: No apparent anesthesia complications

## 2018-09-10 NOTE — Brief Op Note (Signed)
09/10/2018  3:04 PM  PATIENT:  Curtis Riley  84 y.o. male  PRE-OPERATIVE DIAGNOSIS:  right femoral neck fracture  POST-OPERATIVE DIAGNOSIS:  right femoral neck fracture  PROCEDURE:  Procedure(s): TOTAL HIP ARTHROPLASTY ANTERIOR APPROACH (Right)  SURGEON:  Surgeon(s) and Role:    Mcarthur Rossetti, MD - Primary  ANESTHESIA:   general  EBL:  600 mL   COUNTS:  YES  TOURNIQUET:  * No tourniquets in log *  DICTATION: .Other Dictation: Dictation Number [id number]  PLAN OF CARE: Admit to inpatient   PATIENT DISPOSITION:  PACU - hemodynamically stable.   Delay start of Pharmacological VTE agent (>24hrs) due to surgical blood loss or risk of bleeding: no

## 2018-09-11 ENCOUNTER — Other Ambulatory Visit: Payer: Medicare Other

## 2018-09-11 LAB — BASIC METABOLIC PANEL
Anion gap: 8 (ref 5–15)
BUN: 26 mg/dL — ABNORMAL HIGH (ref 8–23)
CO2: 24 mmol/L (ref 22–32)
Calcium: 8.6 mg/dL — ABNORMAL LOW (ref 8.9–10.3)
Chloride: 108 mmol/L (ref 98–111)
Creatinine, Ser: 1.51 mg/dL — ABNORMAL HIGH (ref 0.61–1.24)
GFR calc Af Amer: 49 mL/min — ABNORMAL LOW (ref >60)
GFR calc non Af Amer: 43 mL/min — ABNORMAL LOW (ref >60)
Glucose, Bld: 137 mg/dL — ABNORMAL HIGH (ref 70–99)
Potassium: 4.4 mmol/L (ref 3.5–5.1)
Sodium: 140 mmol/L (ref 135–145)

## 2018-09-11 LAB — CBC
HEMATOCRIT: 37.8 % — AB (ref 39.0–52.0)
HEMOGLOBIN: 11.4 g/dL — AB (ref 13.0–17.0)
MCH: 31.1 pg (ref 26.0–34.0)
MCHC: 30.2 g/dL (ref 30.0–36.0)
MCV: 103 fL — ABNORMAL HIGH (ref 80.0–100.0)
Platelets: 163 10*3/uL (ref 150–400)
RBC: 3.67 MIL/uL — ABNORMAL LOW (ref 4.22–5.81)
RDW: 11.9 % (ref 11.5–15.5)
WBC: 9.4 10*3/uL (ref 4.0–10.5)
nRBC: 0 % (ref 0.0–0.2)

## 2018-09-11 MED ORDER — METOPROLOL TARTRATE 12.5 MG HALF TABLET: 12.5000 mg | ORAL_TABLET | Freq: Two times a day (BID) | ORAL | Status: DC

## 2018-09-11 MED ORDER — SODIUM CHLORIDE 0.9 % IV BOLUS
500.0000 mL | Freq: Once | INTRAVENOUS | Status: AC
  Administered 2018-09-11: 500 mL via INTRAVENOUS

## 2018-09-11 MED ORDER — ENSURE ENLIVE PO LIQD
237.0000 mL | Freq: Two times a day (BID) | ORAL | Status: DC
  Administered 2018-09-12 – 2018-09-14 (×4): 237 mL via ORAL

## 2018-09-11 NOTE — Progress Notes (Signed)
Staff member with PT brought me two tablets that were found in the patients floor while they were working with the patient in his room. The tablets were identified as Protonix 40mg , and Oxycodone 5mg . The tablets were wasted appropriately in the stericycle, witnessed by Geoffry Paradise RN.

## 2018-09-11 NOTE — Progress Notes (Signed)
Initial Nutrition Assessment  INTERVENTION:   Provide Ensure Enlive po BID, each supplement provides 350 kcal and 20 grams of protein  NUTRITION DIAGNOSIS:   Increased nutrient needs related to post-op healing as evidenced by estimated needs.  GOAL:   Patient will meet greater than or equal to 90% of their needs  MONITOR:   PO intake, Supplement acceptance, Labs, Weight trends, I & O's  REASON FOR ASSESSMENT:   Consult Hip fracture protocol  ASSESSMENT:   84 year old male with history of COPD, OSA on CPAP, hypertension, CKD 3 and chronic back pain with radiculopathy admitted after mechanical fall and subsequent right hip fracture.  2/22: s/p TOTAL HIP ARTHROPLASTY ANTERIOR APPROACH (Right)  Patient now on diet, awaiting arrival of breakfast tray. Pt reports poor appetite PTA. Pt  would benefit from nutritional supplementation following hip surgery.  Per weight records, pt has lost 23 lb since July 2019 (9% wt loss x 7 months, insignificant for time frame). Pt is recorded as weighing 264 lb on 08/29/18. Pt with CHF and suspect some weight loss is related to fluid. Pt does have moderate edema at this time.  Medications reviewed. Labs reviewed: GFR: 49   NUTRITION - FOCUSED PHYSICAL EXAM:  Nutrition focused physical exam shows no sign of depletion of muscle mass or body fat.  Diet Order:   Diet Order            Diet regular Room service appropriate? Yes; Fluid consistency: Thin  Diet effective now              EDUCATION NEEDS:   No education needs have been identified at this time  Skin:  Skin Assessment: Reviewed RN Assessment  Last BM:  2/21  Height:   Ht Readings from Last 1 Encounters:  09/09/18 6\' 4"  (1.93 m)    Weight:   Wt Readings from Last 1 Encounters:  09/09/18 103.5 kg    Ideal Body Weight:  91.8 kg  BMI:  Body mass index is 27.77 kg/m.  Estimated Nutritional Needs:   Kcal:  2633-3545  Protein:  110-120g  Fluid:   2.3L/day  Clayton Bibles, MS, RD, LDN [place] [facility] Pager: [phone] After Hours Pager: [phone]

## 2018-09-11 NOTE — Clinical Social Work Note (Signed)
Clinical Social Work Assessment  Patient Details  Name: Curtis Riley MRN: [id number] Date of Birth: 10/18/1934  Date of referral:  09/11/18               Reason for consult:  Facility Placement                Permission sought to share information with:  Family Supports Permission granted to share information::  Yes, Verbal Permission Granted  Name::     daughter Music therapist::     Relationship::     Contact Information:     Housing/Transportation Living arrangements for the past 2 months:  Apartment Source of Information:  Patient, Adult Children Patient Interpreter Needed:  None Criminal Activity/Legal Involvement Pertinent to Current Situation/Hospitalization:  No - Comment as needed Significant Relationships:  Adult Children, Friend Lives with:  Self Do you feel safe going back to the place where you live?  Yes Need for family participation in patient care:  No (Coment)  Care giving concerns:  Pt admitted from home where he resides alone. Had a fall PTA and sustained hip fracture. Had surgery yesterday.  At baseline uses a cane but reports he has been unsteady ambulating recently. Has had multiple falls.   Social Worker assessment / plan:  CSW consulted to assist with SNF placement for short term therapy following hip fracture.  Met with pt at bedside- daughter and another family member present and pt asked that they participate as well. Pt states he is open to pursuing rehab, states he agrees he needs more intensive therapy than outpatient or home (has had this before).  Made referrals and will follow up with bed offers.   Employment status:  Retired Health visitor PT Recommendations:  [place] / Referral to community resources:  [place]  Patient/Family's Response to care:  Engaged, appreciative  Patient/Family's Understanding of and Emotional Response to Diagnosis, Current Treatment, and Prognosis:  Pt  ws good historian of his health/care needs and admits that he "sometimes doesn't do what I should for myself." States "I was trying to stay inside when it snowed so I wouldn't fall outside and then I fell inside anyway." reports hip fracture is "wake up call" to use his walker and cane and avoid taking ambulation risks.  Emotional Assessment Appearance:  Well-Groomed, Appears younger than stated age Attitude/Demeanor/Rapport:  Engaged Affect (typically observed):  Accepting, Calm Orientation:  Oriented to Self, Oriented to Place, Oriented to  Time, Oriented to Situation Alcohol / Substance use:  Not Applicable Psych involvement (Current and /or in the community):  No (Comment)  Discharge Needs  Concerns to be addressed:  Discharge Planning Concerns Readmission within the last 30 days:  No Current discharge risk:  Lives alone Barriers to Discharge:  Continued Medical Work up   [employer], LCSW 09/11/2018, 3:40 PM [phone] weekend coverage for [phone]

## 2018-09-11 NOTE — Progress Notes (Signed)
PROGRESS NOTE  Curtis Riley QIO:[id number] DOB: 1935/02/08 DOA: 09/09/2018 PCP: Denita Lung, MD   LOS: 2 days   Brief Narrative / Interim history: 84 year old male with history of COPD, OSA on CPAP, hypertension, CKD 3 and chronic back pain with radiculopathy admitted after mechanical fall and subsequent right hip fracture.  Patient reports 3 incidents of falls in the last 1 month with unsteady gait.  No prodromes or cardiopulmonary symptoms.  Followed by orthopedic surgery.  Plan was to get MRI lumbar spine on 2/23.  He denies fever, unintentional weight loss, fecal incontinence or urinary retention.  In ED, vitals, CBC and CMP not impressive.  EKG sinus rhythm with RAD and LAFB.  Chest x-ray not impressive.  Right hip x-ray showed acute varus angulated transcervical fracture of the right femur and some arthritic changes of lumbosacral vertebras. Patient had total hip arthroplasty on 2/22.   Subjective: No major events overnight of this morning.  No complaint this morning.  Pain well controlled.  Passing gases but has not had bowel movement.  Denies chest pain, dyspnea or dysuria.  Assessment & Plan: Principal Problem:   Closed right hip fracture, initial encounter [facility]) Active Problems:   Chronic obstructive pulmonary disease (HCC)   OSA on CPAP   Chronic back pain   Hypertension   COPD (chronic obstructive pulmonary disease) (HCC)   CKD (chronic kidney disease) stage 3, GFR 30-59 ml/min (HCC)  Mechanical fall Closed right hip fracture Chronic back pain/radiculopathy/unsteady gait -Status post total hip arthroplasty on 2/22 -Pain control -Aspirin 81 mg twice daily and SCD for VT prophylaxis -MRI lumbar spine -PT-OT eval  Chronic COPD: Had wheezing this morning that has resolved with DuoNeb.  No cough or shortness of breath. -Continue DuoNeb  Hypertension: Normotensive -Continue home meds  CKD 3: Stable -Continue monitoring  OSA -Nightly CPAP  Scheduled  Meds: . aspirin  81 mg Oral BID  . carvedilol  25 mg Oral BID  . docusate sodium  100 mg Oral BID  . ipratropium-albuterol  3 mL Nebulization BID  . lisinopril  40 mg Oral Daily  . pantoprazole  40 mg Oral Daily   Continuous Infusions: . sodium chloride Stopped (09/11/18 0845)  . methocarbamol (ROBAXIN) IV     PRN Meds:.acetaminophen, alum & mag hydroxide-simeth, HYDROmorphone (DILAUDID) injection, ipratropium-albuterol, menthol-cetylpyridinium **OR** phenol, methocarbamol **OR** methocarbamol (ROBAXIN) IV, metoCLOPramide **OR** metoCLOPramide (REGLAN) injection, ondansetron **OR** ondansetron (ZOFRAN) IV, oxyCODONE, oxyCODONE  DVT prophylaxis: Aspirin and SCD Code Status: Full code Family Communication: None at bedside Disposition Plan: Remains inpatient  Consultants:   Orthopedic surgery  Procedures:   THA 2/22  Antimicrobials:  Cefazolin perioperatively  Objective: Vitals:   09/10/18 2135 09/11/18 0206 09/11/18 0537 09/11/18 0834  BP:  125/77 (!) 151/78   Pulse:  76 81   Resp:  16 14   Temp:   98.3 F (36.8 C)   TempSrc:   Oral   SpO2: 93% 99% 99% 96%  Weight:      Height:        Intake/Output Summary (Last 24 hours) at 09/11/2018 0908 Last data filed at 09/11/2018 0542 Gross per 24 hour  Intake 2671.82 ml  Output 1425 ml  Net 1246.82 ml   Filed Weights   09/09/18 1856 09/09/18 2323  Weight: 119.7 kg 103.5 kg    Examination: GENERAL: Appears well. No acute distress.  HEENT: MMM.  Vision and Hearing grossly intact.  NECK: Supple.  No JVD.  LUNGS:  No IWOB. Good air  movement.  Mild end expiratory wheeze. HEART:  RRR. Heart sounds normal.  ABD: Bowel sounds present. Soft. Non tender.  EXT: Trace edema/venous insufficiency in both extremities.  Moving both extremities. SKIN: no apparent skin lesion.  NEURO: Awake, alert and oriented appropriately.  No gross deficit.  PSYCH: Calm. Normal affect.  Data Reviewed: I have independently reviewed following  labs and imaging studies  CBC: Recent Labs  Lab 09/09/18 2108 09/11/18 0420  WBC 9.3 9.4  NEUTROABS 7.7  --   HGB 13.3 11.4*  HCT 43.0 37.8*  MCV 100.0 103.0*  PLT 197 314   Basic Metabolic Panel: Recent Labs  Lab 09/09/18 2108 09/11/18 0420  NA 140 140  K 5.1 4.4  CL 108 108  CO2 24 24  GLUCOSE 83 137*  BUN 17 26*  CREATININE 1.56* 1.51*  CALCIUM 9.2 8.6*   GFR: Estimated Creatinine Clearance: 47.1 mL/min (A) (by C-G formula based on SCr of 1.51 mg/dL (H)). Liver Function Tests: Recent Labs  Lab 09/09/18 2108  AST 36  ALT 19  ALKPHOS 74  BILITOT 1.6*  PROT 6.9  ALBUMIN 3.7   No results for input(s): LIPASE, AMYLASE in the last 168 hours. No results for input(s): AMMONIA in the last 168 hours. Coagulation Profile: Recent Labs  Lab 09/09/18 2108  INR 1.07   Cardiac Enzymes: No results for input(s): CKTOTAL, CKMB, CKMBINDEX, TROPONINI in the last 168 hours. BNP (last 3 results) No results for input(s): PROBNP in the last 8760 hours. HbA1C: No results for input(s): HGBA1C in the last 72 hours. CBG: No results for input(s): GLUCAP in the last 168 hours. Lipid Profile: No results for input(s): CHOL, HDL, LDLCALC, TRIG, CHOLHDL, LDLDIRECT in the last 72 hours. Thyroid Function Tests: No results for input(s): TSH, T4TOTAL, FREET4, T3FREE, THYROIDAB in the last 72 hours. Anemia Panel: No results for input(s): VITAMINB12, FOLATE, FERRITIN, TIBC, IRON, RETICCTPCT in the last 72 hours. Urine analysis: No results found for: COLORURINE, APPEARANCEUR, LABSPEC, PHURINE, GLUCOSEU, HGBUR, BILIRUBINUR, KETONESUR, PROTEINUR, UROBILINOGEN, NITRITE, LEUKOCYTESUR Sepsis Labs: Invalid input(s): PROCALCITONIN, LACTICIDVEN  Recent Results (from the past 240 hour(s))  Surgical pcr screen     Status: None   Collection Time: 09/09/18 10:25 PM  Result Value Ref Range Status   MRSA, PCR NEGATIVE NEGATIVE Final   Staphylococcus aureus NEGATIVE NEGATIVE Final    Comment:  (NOTE) The Xpert SA Assay (FDA approved for NASAL specimens in patients 89 years of age and older), is one component of a comprehensive surveillance program. It is not intended to diagnose infection nor to guide or monitor treatment. Performed at [facility], [place] [address] [place], [place] [address]       Radiology Studies: Dg Pelvis Portable  Result Date: 09/10/2018 CLINICAL DATA:  Postoperative EXAM: PORTABLE PELVIS 1-2 VIEWS COMPARISON:  09/10/2018 FINDINGS: Changes of right hip replacement. Normal AP alignment. No hardware or bony complicating feature. IMPRESSION: Right hip replacement.  No complicating feature. Electronically Signed   By: Rolm Baptise M.D.   On: 09/10/2018 17:21   Dg C-arm 1-60 Min-no Report  Result Date: 09/10/2018 Fluoroscopy was utilized by the requesting physician.  No radiographic interpretation.   Dg Hip Operative Unilat W Or W/o Pelvis Right  Result Date: 09/10/2018 CLINICAL DATA:  Right hip replacement. EXAM: OPERATIVE RIGHT HIP (WITH PELVIS IF PERFORMED) 1 VIEW TECHNIQUE: Fluoroscopic spot image(s) were submitted for interpretation post-operatively. COMPARISON:  09/09/2018 FINDINGS: Initial images demonstrate the fracture involving the right femoral neck. A right hip  arthroplasty was performed. The entire femoral stem is visualized. No evidence for a periprosthetic fracture. Femoral head appears to be located on this single view examination. IMPRESSION: Intraoperative images for right hip arthroplasty. Electronically Signed   By: Markus Daft M.D.   On: 09/10/2018 15:59    Taye T. [facility] Pager [phone]  If 7PM-7AM, please contact night-coverage [email or web address] Password [email or web address] 09/11/2018, 9:08 AM

## 2018-09-11 NOTE — Progress Notes (Signed)
Subjective: 1 Day Post-Op Procedure(s) (LRB): TOTAL HIP ARTHROPLASTY ANTERIOR APPROACH (Right) Patient reports pain as mild.  Feeling well this am.   Objective: Vital signs in last 24 hours: Temp:  [97.4 F (36.3 C)-98.3 F (36.8 C)] 98.3 F (36.8 C) (02/23 0537) Pulse Rate:  [71-81] 81 (02/23 0537) Resp:  [8-20] 14 (02/23 0537) BP: (113-151)/(65-90) 151/78 (02/23 0537) SpO2:  [92 %-100 %] 99 % (02/23 0537)  Intake/Output from previous day: 02/22 0701 - 02/23 0700 In: 2671.8 [P.O.:300; I.V.:2121.8; IV Piggyback:250] Out: 1425 [Urine:825; Blood:600] Intake/Output this shift: No intake/output data recorded.  Recent Labs    09/09/18 2108 09/11/18 0420  HGB 13.3 11.4*   Recent Labs    09/09/18 2108 09/11/18 0420  WBC 9.3 9.4  RBC 4.30 3.67*  HCT 43.0 37.8*  PLT 197 163   Recent Labs    09/09/18 2108 09/11/18 0420  NA 140 140  K 5.1 4.4  CL 108 108  CO2 24 24  BUN 17 26*  CREATININE 1.56* 1.51*  GLUCOSE 83 137*  CALCIUM 9.2 8.6*   Recent Labs    09/09/18 2108  INR 1.07    Neurologically intact Neurovascular intact Sensation intact distally Intact pulses distally Dorsiflexion/Plantar flexion intact Incision: dressing C/D/I No cellulitis present Compartment soft    Assessment/Plan: 1 Day Post-Op Procedure(s) (LRB): TOTAL HIP ARTHROPLASTY ANTERIOR APPROACH (Right) Up with therapy  WBAT RLE ABLA- mild and stable Please apply ted hose to [place] from ortho standpoint to proceed with lumbar MRI       Aundra Dubin 09/11/2018, 7:11 AM

## 2018-09-11 NOTE — NC FL2 (Signed)
[place] LEVEL OF CARE SCREENING TOOL     IDENTIFICATION  Patient Name: Curtis Riley Birthdate: 1934/11/19 Sex: male Admission Date (Current Location): 09/09/2018  [facility] and [place] Number:  Herbalist and Address:  [facility],  [place] [address], [place]      Provider Number: [id number]  Attending Physician Name and Address:  Mercy Riding, MD  Relative Name and Phone Number:       Current Level of Care: Hospital Recommended Level of Care: [place] Prior Approval Number:    Date Approved/Denied:   PASRR Number: [id number]  Discharge Plan: SNF    Current Diagnoses: Patient Active Problem List   Diagnosis Date Noted  . CKD (chronic kidney disease) stage 3, GFR 30-59 ml/min (HCC) 09/10/2018  . Closed right hip fracture, initial encounter ([place]) 09/09/2018  . Smoker   . Sleep apnea   . Shortness of breath   . Neuromuscular disorder ([place])   . Kidney stones   . Hypertension   . Family history of coronary artery disease   . Dyslipidemia   . Diverticulosis   . COPD (chronic obstructive pulmonary disease) ([place])   . BPH (benign prostatic hyperplasia)   . Dyspnea 04/29/2017  . Chest pain 03/23/2017  . Arthritis 12/21/2016  . Lung nodules 12/17/2016  . History of asbestos exposure 09/22/2016  . Chronic diastolic heart failure ([place]) 09/22/2016  . Chronic back pain 06/09/2016  . Congestive heart failure ([place]) 10/19/2014  . OSA on CPAP 10/18/2014  . Chronic obstructive pulmonary disease ([place]) 10/09/2014  . Leukocytosis 10/09/2014  . Chronic respiratory failure with hypoxia ([place]) 03/29/2014  . Essential hypertension 03/26/2011  . History of nuclear stress test 03/20/2010    Orientation RESPIRATION BLADDER Height & Weight     Self, Time, Situation, Place  Normal Continent Weight: 228 lb 2.8 oz (103.5 kg) Height:  6\' 4"  (193 cm)  BEHAVIORAL SYMPTOMS/MOOD NEUROLOGICAL BOWEL NUTRITION STATUS       Continent Diet(regular)  AMBULATORY STATUS COMMUNICATION OF NEEDS Skin   Extensive Assist Verbally Surgical wounds(right hip silver hydrofiber)                       Personal Care Assistance Level of Assistance  Bathing, Feeding, Dressing Bathing Assistance: Maximum assistance Feeding assistance: Independent Dressing Assistance: Maximum assistance     Functional Limitations Info  Sight, Hearing, Speech Sight Info: Adequate Hearing Info: Adequate Speech Info: Adequate    SPECIAL CARE FACTORS FREQUENCY  PT (By licensed PT), OT (By licensed OT)     PT Frequency: 5x OT Frequency: 5x            Contractures Contractures Info: Not present    Additional Factors Info  Code Status, Allergies Code Status Info: full code Allergies Info: penicillins           Current Medications (09/11/2018):  This is the current hospital active medication list Current Facility-Administered Medications  Medication Dose Route Frequency Provider Last Rate Last Dose  . 0.9 %  sodium chloride infusion   Intravenous Continuous Mcarthur Rossetti, MD   Stopped at 09/11/18 0845  . acetaminophen (TYLENOL) tablet 325-650 mg  325-650 mg Oral Q6H PRN Mcarthur Rossetti, MD   650 mg at 09/10/18 1821  . alum & mag hydroxide-simeth (MAALOX/MYLANTA) 200-200-20 MG/5ML suspension 30 mL  30 mL Oral Q4H PRN Mcarthur Rossetti, MD      . aspirin chewable tablet 81 mg  81 mg Oral BID Mcarthur Rossetti, MD   81 mg at 09/11/18 0844  . docusate sodium (COLACE) capsule 100 mg  100 mg Oral BID Mcarthur Rossetti, MD   100 mg at 09/11/18 0843  . feeding supplement (ENSURE ENLIVE) (ENSURE ENLIVE) liquid 237 mL  237 mL Oral BID BM Gonfa, Taye T, MD      . HYDROmorphone (DILAUDID) injection 0.5-1 mg  0.5-1 mg Intravenous Q4H PRN Mcarthur Rossetti, MD      . ipratropium-albuterol (DUONEB) 0.5-2.5 (3) MG/3ML nebulizer solution 3 mL  3 mL Inhalation Q6H PRN Mcarthur Rossetti, MD   3 mL  at 09/10/18 1049  . ipratropium-albuterol (DUONEB) 0.5-2.5 (3) MG/3ML nebulizer solution 3 mL  3 mL Nebulization BID Mcarthur Rossetti, MD   3 mL at 09/11/18 0834  . lisinopril (PRINIVIL,ZESTRIL) tablet 40 mg  40 mg Oral Daily Mcarthur Rossetti, MD   40 mg at 09/11/18 0843  . menthol-cetylpyridinium (CEPACOL) lozenge 3 mg  1 lozenge Oral PRN Mcarthur Rossetti, MD       Or  . phenol (CHLORASEPTIC) mouth spray 1 spray  1 spray Mouth/Throat PRN Mcarthur Rossetti, MD      . methocarbamol (ROBAXIN) tablet 500 mg  500 mg Oral Q6H PRN Mcarthur Rossetti, MD   500 mg at 09/11/18 0540   Or  . methocarbamol (ROBAXIN) 500 mg in dextrose 5 % 50 mL IVPB  500 mg Intravenous Q6H PRN Mcarthur Rossetti, MD      . metoCLOPramide (REGLAN) tablet 5-10 mg  5-10 mg Oral Q8H PRN Mcarthur Rossetti, MD       Or  . metoCLOPramide (REGLAN) injection 5-10 mg  5-10 mg Intravenous Q8H PRN Mcarthur Rossetti, MD      . Derrill Memo ON 09/12/2018] metoprolol tartrate (LOPRESSOR) tablet 12.5 mg  12.5 mg Oral BID Wendee Beavers T, MD      . ondansetron (ZOFRAN) tablet 4 mg  4 mg Oral Q6H PRN Mcarthur Rossetti, MD       Or  . ondansetron [facility]) injection 4 mg  4 mg Intravenous Q6H PRN Mcarthur Rossetti, MD      . oxyCODONE (Oxy IR/ROXICODONE) immediate release tablet 10-15 mg  10-15 mg Oral Q4H PRN Mcarthur Rossetti, MD   10 mg at 09/11/18 0540  . oxyCODONE (Oxy IR/ROXICODONE) immediate release tablet 5-10 mg  5-10 mg Oral Q4H PRN Mcarthur Rossetti, MD   5 mg at 09/10/18 1821  . pantoprazole (PROTONIX) EC tablet 40 mg  40 mg Oral Daily Mcarthur Rossetti, MD   40 mg at 09/11/18 7622     Discharge Medications: Please see discharge summary for a list of discharge medications.  Relevant Imaging Results:  Relevant Lab Results:   Additional Information SS# [id number]  Nila Nephew, LCSW

## 2018-09-11 NOTE — Evaluation (Signed)
Physical Therapy Evaluation Patient Details Name: Curtis Riley MRN: [id number] DOB: 01-26-1935 Today's Date: 09/11/2018   History of Present Illness  84 year old male with history of COPD, OSA on CPAP, hypertension, CKD 3 and chronic back pain with radiculopathy admitted after mechanical fall and subsequent right hip fracture.  Pt s/p R direct anterior THA 09/10/18  Clinical Impression  Patient is s/p above surgery resulting in functional limitations due to the deficits listed below (see PT Problem List).  Patient will benefit from skilled PT to increase their independence and safety with mobility to allow discharge to the venue listed below.  Pt requiring assist for weakness and balance during mobility this session.  Pt reports increased falls lately.  Pt from home alone and agreeable for rehab at SNF upon d/c.     Follow Up Recommendations SNF    Equipment Recommendations  Rolling walker with 5" wheels    Recommendations for Other Services       Precautions / Restrictions Precautions Precautions: Fall Restrictions Weight Bearing Restrictions: No RLE Weight Bearing: Weight bearing as tolerated      Mobility  Bed Mobility Overal bed mobility: Needs Assistance Bed Mobility: Supine to Sit     Supine to sit: Min assist;HOB elevated     General bed mobility comments: assist for R LE   Transfers Overall transfer level: Needs assistance Equipment used: Rolling walker (2 wheeled) Transfers: Sit to/from Stand Sit to Stand: Min assist;+2 physical assistance         General transfer comment: verbal cues for UE and LE positioning; assist to rise and steady as well as control descent  Ambulation/Gait Ambulation/Gait assistance: Mod assist;+2 physical assistance Gait Distance (Feet): 8 Feet Assistive device: Rolling walker (2 wheeled) Gait Pattern/deviations: Step-to pattern;Decreased stance time - right;Antalgic     General Gait Details: verbal cues for sequence, step  length, RW positioning, use of UEs on RW, pt with LE buckling and fatigued quickly so recliner brought behind pt  Stairs            Wheelchair Mobility    Modified Rankin (Stroke Patients Only)       Balance Overall balance assessment: History of Falls                                           Pertinent Vitals/Pain Pain Assessment: 0-10 Pain Score: 3 (none at rest) Pain Location: R hip with movement  Pain Descriptors / Indicators: ("stiff") Pain Intervention(s): Monitored during session;Limited activity within patient's tolerance;Repositioned;Ice applied    Home Living Family/patient expects to be discharged to:: Skilled nursing facility Living Arrangements: Alone                    Prior Function Level of Independence: Independent with assistive device(s)         Comments: uses SPC, reports increased falls lately     Hand Dominance        Extremity/Trunk Assessment        Lower Extremity Assessment Lower Extremity Assessment: RLE deficits/detail;Generalized weakness RLE Deficits / Details: anticipated post op hip weakness, poor quad contraction       Communication   Communication: No difficulties  Cognition Arousal/Alertness: Awake/alert Behavior During Therapy: WFL for tasks assessed/performed Overall Cognitive Status: Within Functional Limits for tasks assessed  General Comments      Exercises     Assessment/Plan    PT Assessment Patient needs continued PT services  PT Problem List Decreased strength;Decreased mobility;Decreased activity tolerance;Decreased balance;Decreased knowledge of use of DME;Decreased knowledge of precautions;Decreased coordination       PT Treatment Interventions DME instruction;Functional mobility training;Gait training;Therapeutic activities;Neuromuscular re-education;Balance training;Patient/family education;Therapeutic exercise     PT Goals (Current goals can be found in the Care Plan section)  Acute Rehab PT Goals PT Goal Formulation: With patient Time For Goal Achievement: 09/18/18 Potential to Achieve Goals: Good    Frequency Min 3X/week   Barriers to discharge        Co-evaluation               AM-PAC PT "6 Clicks" Mobility  Outcome Measure Help needed turning from your back to your side while in a flat bed without using bedrails?: A Little Help needed moving from lying on your back to sitting on the side of a flat bed without using bedrails?: A Lot Help needed moving to and from a bed to a chair (including a wheelchair)?: A Lot Help needed standing up from a chair using your arms (e.g., wheelchair or bedside chair)?: A Lot Help needed to walk in hospital room?: A Lot Help needed climbing 3-5 steps with a railing? : Total 6 Click Score: 12    End of Session Equipment Utilized During Treatment: Gait belt Activity Tolerance: Patient tolerated treatment well Patient left: in chair;with chair alarm set;with call bell/phone within reach Nurse Communication: Mobility status PT Visit Diagnosis: Difficulty in walking, not elsewhere classified (R26.2);Unsteadiness on feet (R26.81)    Time: 0109-3235 PT Time Calculation (min) (ACUTE ONLY): 20 min   Charges:   PT Evaluation $PT Eval Low Complexity: [place], PT, DPT Acute Rehabilitation Services Office: [phone] Pager: [phone]  Trena Platt 09/11/2018, 12:31 PM

## 2018-09-12 ENCOUNTER — Inpatient Hospital Stay (HOSPITAL_COMMUNITY): Payer: Medicare Other

## 2018-09-12 ENCOUNTER — Encounter (HOSPITAL_COMMUNITY): Payer: Self-pay | Admitting: Orthopaedic Surgery

## 2018-09-12 ENCOUNTER — Ambulatory Visit (INDEPENDENT_AMBULATORY_CARE_PROVIDER_SITE_OTHER): Payer: Medicare Other | Admitting: Orthopaedic Surgery

## 2018-09-12 LAB — BASIC METABOLIC PANEL
Anion gap: 7 (ref 5–15)
BUN: 33 mg/dL — ABNORMAL HIGH (ref 8–23)
CO2: 22 mmol/L (ref 22–32)
Calcium: 8.4 mg/dL — ABNORMAL LOW (ref 8.9–10.3)
Chloride: 109 mmol/L (ref 98–111)
Creatinine, Ser: 1.74 mg/dL — ABNORMAL HIGH (ref 0.61–1.24)
GFR calc Af Amer: 42 mL/min — ABNORMAL LOW (ref >60)
GFR calc non Af Amer: 36 mL/min — ABNORMAL LOW (ref >60)
Glucose, Bld: 113 mg/dL — ABNORMAL HIGH (ref 70–99)
POTASSIUM: 4.1 mmol/L (ref 3.5–5.1)
Sodium: 138 mmol/L (ref 135–145)

## 2018-09-12 LAB — CBC
HCT: 35.2 % — ABNORMAL LOW (ref 39.0–52.0)
Hemoglobin: 10.4 g/dL — ABNORMAL LOW (ref 13.0–17.0)
MCH: 30.8 pg (ref 26.0–34.0)
MCHC: 29.5 g/dL — ABNORMAL LOW (ref 30.0–36.0)
MCV: 104.1 fL — ABNORMAL HIGH (ref 80.0–100.0)
Platelets: 136 10*3/uL — ABNORMAL LOW (ref 150–400)
RBC: 3.38 MIL/uL — ABNORMAL LOW (ref 4.22–5.81)
RDW: 12 % (ref 11.5–15.5)
WBC: 9.9 10*3/uL (ref 4.0–10.5)
nRBC: 0 % (ref 0.0–0.2)

## 2018-09-12 LAB — GLUCOSE, CAPILLARY: Glucose-Capillary: 126 mg/dL — ABNORMAL HIGH (ref 70–99)

## 2018-09-12 MED ORDER — METOPROLOL TARTRATE 25 MG PO TABS
25.0000 mg | ORAL_TABLET | Freq: Two times a day (BID) | ORAL | Status: DC
  Administered 2018-09-12 – 2018-09-14 (×4): 25 mg via ORAL
  Filled 2018-09-12 (×4): qty 1

## 2018-09-12 NOTE — Progress Notes (Signed)
Subjective: 2 Days Post-Op Procedure(s) (LRB): TOTAL HIP ARTHROPLASTY ANTERIOR APPROACH (Right) Patient reports pain as moderate.  Slow mobility with PT.  May need short-term skilled nursing.  Acute blood loss anemia from his fracture and surgery - tolerating well.  Objective: Vital signs in last 24 hours: Temp:  [97.8 F (36.6 C)-99 F (37.2 C)] 98.3 F (36.8 C) (02/23 1936) Pulse Rate:  [75-110] 79 (02/24 0348) Resp:  [15-17] 16 (02/23 1936) BP: (82-139)/(59-83) 139/83 (02/24 0348) SpO2:  [93 %-100 %] 100 % (02/24 0348)  Intake/Output from previous day: 02/23 0701 - 02/24 0700 In: 1443.6 [P.O.:300; I.V.:643.6; IV Piggyback:500] Out: 225 [Urine:225] Intake/Output this shift: No intake/output data recorded.  Recent Labs    09/09/18 2108 09/11/18 0420 09/12/18 0434  HGB 13.3 11.4* 10.4*   Recent Labs    09/11/18 0420 09/12/18 0434  WBC 9.4 9.9  RBC 3.67* 3.38*  HCT 37.8* 35.2*  PLT 163 136*   Recent Labs    09/11/18 0420 09/12/18 0434  NA 140 138  K 4.4 4.1  CL 108 109  CO2 24 22  BUN 26* 33*  CREATININE 1.51* 1.74*  GLUCOSE 137* 113*  CALCIUM 8.6* 8.4*   Recent Labs    09/09/18 2108  INR 1.07    Sensation intact distally Intact pulses distally Dorsiflexion/Plantar flexion intact Incision: scant drainage   Assessment/Plan: 2 Days Post-Op Procedure(s) (LRB): TOTAL HIP ARTHROPLASTY ANTERIOR APPROACH (Right) Up with therapy  WBAT right hip      Mcarthur Rossetti 09/12/2018, 7:40 AM

## 2018-09-12 NOTE — Progress Notes (Addendum)
PROGRESS NOTE        PATIENT DETAILS Name: Curtis Riley Age: 84 y.o. Sex: male Date of Birth: [birth year] Admit Date: 09/09/2018 Admitting Physician Etta Quill, DO [id number], Elyse Jarvis, MD  Brief Narrative: Patient is a 84 y.o. male with past medial history of COPD, OSA managed with CPAP, HTN, CKD 3, and chronic back pain with radiculopathy who presents with chief complaint of mechanical fall resulting in displaced right hip fracture. He underwent total right hip arthroplasty on 09/10/18, currently followed by orthopedics. Patient has had an increase in falls lately secondary to unsteady gait, reports 3 falls in the past 1 month. Patient reports mild pain, otherwise lying comfortably in bed. Plan for MRI of lumbar spine today, 2/24.   Subjective: Patient states he had a syncopal episode last night while ambulating to bedside commode. He says that he felt dizzy before the event. Nursing states he became unresponsive while standing up from the bedside commode. BP was 190/100. Patient had a bowel movement into bedside commode, suspect vasovagal episode. BP this morning stable at 115/69. Patient denies dizziness, lightheadedness, nausea, vomiting, chest pain, or shortness of breath.    Assessment/Plan: Principal Problem:   Closed right hip fracture, initial encounter ([place]) Active Problems:   Chronic obstructive pulmonary disease (HCC)   OSA on CPAP   Chronic back pain   Hypertension   COPD (chronic obstructive pulmonary disease) (HCC)   CKD (chronic kidney disease) stage 3, GFR 30-59 ml/min (HCC)  1. Closed right hip fracture s/p total arthroplasty on 2/22. Chronic back pain.  Patient reports mild pain, will continue pain control. Continue ASA 81 mg BID and SCD for DVT prophylaxis. Patient lives at home alone, PT recommendation for skilled PT. Consider short-term skilled nursing facility placement upon d/c. Plan for lumbar spine MRI today, 2/24.   2. COPD.    Reports no difficulties with breathing. Continue DuoNeb.  3. OSA on CPAP.  Patient tolerated CPAP for 8 hours last night. Continue nightly CPAP.   4. HTN. Due to hypotension noted yesterday and vasovagal episode last night, plan to stop Coreg and hold today's dose of Lisinopril. Patient started on PO Metoprolol 12.5 mg BID.    5. CKD 3.  Stable, continue monitoring.    DVT Prophylaxis: Prophylactic SCD and Aspirin 81 mg BID.  Code Status: Full code. Disposition Plan: Remain inpatient- consider SNF on discharge.  Consults: Orthopedic surgery  Procedure: Right direct anterior THA  Morning labs/Imaging ordered: yes  Antimicrobial agents: Anti-infectives (From admission, onward)   Start     Dose/Rate Route Frequency Ordered Stop   09/11/18 0200  vancomycin (VANCOCIN) IVPB 1000 mg/200 mL premix     1,000 mg 200 mL/hr over 60 Minutes Intravenous Every 12 hours 09/10/18 1630 09/11/18 0242   09/10/18 1257  vancomycin (VANCOCIN) 1-5 GM/200ML-% IVPB    Note to Pharmacy:  Dellie Catholic   : cabinet override      09/10/18 1257 09/11/18 0059      MEDICATIONS: Scheduled Meds: . aspirin  81 mg Oral BID  . docusate sodium  100 mg Oral BID  . feeding supplement (ENSURE ENLIVE)  237 mL Oral BID BM  . ipratropium-albuterol  3 mL Nebulization BID  . lisinopril  40 mg Oral Daily  . metoprolol tartrate  12.5 mg Oral BID  . pantoprazole  40 mg Oral  Daily   Continuous Infusions: . sodium chloride Stopped (09/11/18 1352)  . methocarbamol (ROBAXIN) IV     PRN Meds:.acetaminophen, alum & mag hydroxide-simeth, HYDROmorphone (DILAUDID) injection, ipratropium-albuterol, menthol-cetylpyridinium **OR** phenol, methocarbamol **OR** methocarbamol (ROBAXIN) IV, metoCLOPramide **OR** metoCLOPramide (REGLAN) injection, ondansetron **OR** ondansetron (ZOFRAN) IV, oxyCODONE, oxyCODONE   PHYSICAL EXAM: Vital signs: Vitals:   09/11/18 2110 09/12/18 0348 09/12/18 0851 09/12/18 0933  BP:  139/83  115/69   Pulse:  79  90  Resp:    14  Temp:    98 F (36.7 C)  TempSrc:    Oral  SpO2: 93% 100% 95% 96%  Weight:      Height:       Filed Weights   09/09/18 1856 09/09/18 2323  Weight: 119.7 kg 103.5 kg   Body mass index is 27.77 kg/m.   General: awake, alert, not in any distress. HEENT: vision and hearing grossly intact. Neck: supple, no JVD.  CV: regular rate, regular rhythm. No murmurs, rubs, or gallops.  Pulm: good air entry bilaterally. GI: bowel sounds present. Soft, non tender, not distended. Extremities: both legs are warm to touch. Neurology:  speech clear, no gross deficit. Psychiatric: calm. Normal judgment and insight. Skin: no rash or skin lesions. Warm and dry.  I have personally reviewed following labs and imaging studies  LABORATORY DATA: CBC: Recent Labs  Lab 09/09/18 2108 09/11/18 0420 09/12/18 0434  WBC 9.3 9.4 9.9  NEUTROABS 7.7  --   --   HGB 13.3 11.4* 10.4*  HCT 43.0 37.8* 35.2*  MCV 100.0 103.0* 104.1*  PLT 197 163 136*    Basic Metabolic Panel: Recent Labs  Lab 09/09/18 2108 09/11/18 0420 09/12/18 0434  NA 140 140 138  K 5.1 4.4 4.1  CL 108 108 109  CO2 24 24 22   GLUCOSE 83 137* 113*  BUN 17 26* 33*  CREATININE 1.56* 1.51* 1.74*  CALCIUM 9.2 8.6* 8.4*    GFR: Estimated Creatinine Clearance: 40.9 mL/min (A) (by C-G formula based on SCr of 1.74 mg/dL (H)).  Liver Function Tests: Recent Labs  Lab 09/09/18 2108  AST 36  ALT 19  ALKPHOS 74  BILITOT 1.6*  PROT 6.9  ALBUMIN 3.7   No results for input(s): LIPASE, AMYLASE in the last 168 hours. No results for input(s): AMMONIA in the last 168 hours.  Coagulation Profile: Recent Labs  Lab 09/09/18 2108  INR 1.07    Cardiac Enzymes: No results for input(s): CKTOTAL, CKMB, CKMBINDEX, TROPONINI in the last 168 hours.  BNP (last 3 results) No results for input(s): PROBNP in the last 8760 hours.  HbA1C: No results for input(s): HGBA1C in the last 72 hours.  CBG: Recent  Labs  Lab 09/12/18 0345  GLUCAP 126*    Lipid Profile: No results for input(s): CHOL, HDL, LDLCALC, TRIG, CHOLHDL, LDLDIRECT in the last 72 hours.  Thyroid Function Tests: No results for input(s): TSH, T4TOTAL, FREET4, T3FREE, THYROIDAB in the last 72 hours.  Anemia Panel: No results for input(s): VITAMINB12, FOLATE, FERRITIN, TIBC, IRON, RETICCTPCT in the last 72 hours.  Urine analysis: No results found for: COLORURINE, APPEARANCEUR, LABSPEC, PHURINE, GLUCOSEU, HGBUR, BILIRUBINUR, KETONESUR, PROTEINUR, UROBILINOGEN, NITRITE, LEUKOCYTESUR  Sepsis Labs: Lactic Acid, Venous    Component Value Date/Time   LATICACIDVEN 1.47 10/07/2014 0856    MICROBIOLOGY: Recent Results (from the past 240 hour(s))  Surgical pcr screen     Status: None   Collection Time: 09/09/18 10:25 PM  Result Value Ref Range Status  MRSA, PCR NEGATIVE NEGATIVE Final   Staphylococcus aureus NEGATIVE NEGATIVE Final    Comment: (NOTE) The Xpert SA Assay (FDA approved for NASAL specimens in patients 1 years of age and older), is one component of a comprehensive surveillance program. It is not intended to diagnose infection nor to guide or monitor treatment. Performed at [facility], [place] [address], [place], [place] [address]     RADIOLOGY STUDIES/RESULTS: Dg Pelvis Portable  Result Date: 09/10/2018 CLINICAL DATA:  Postoperative EXAM: PORTABLE PELVIS 1-2 VIEWS COMPARISON:  09/10/2018 FINDINGS: Changes of right hip replacement. Normal AP alignment. No hardware or bony complicating feature. IMPRESSION: Right hip replacement.  No complicating feature. Electronically Signed   By: Rolm Baptise M.D.   On: 09/10/2018 17:21   Dg Chest Port 1 View  Result Date: 09/09/2018 CLINICAL DATA:  Preop hip surgery EXAM: PORTABLE CHEST 1 VIEW COMPARISON:  CT 10/26/2017, radiograph 09/24/2016 FINDINGS: Postsurgical changes of the cervical spine. No acute opacity or pleural effusion. Normal  cardiomediastinal silhouette. No pneumothorax. IMPRESSION: No active disease. Electronically Signed   By: Donavan Foil M.D.   On: 09/09/2018 20:28   Dg C-arm 1-60 Min-no Report  Result Date: 09/10/2018 Fluoroscopy was utilized by the requesting physician.  No radiographic interpretation.   Dg Hip Operative Unilat W Or W/o Pelvis Right  Result Date: 09/10/2018 CLINICAL DATA:  Right hip replacement. EXAM: OPERATIVE RIGHT HIP (WITH PELVIS IF PERFORMED) 1 VIEW TECHNIQUE: Fluoroscopic spot image(s) were submitted for interpretation post-operatively. COMPARISON:  09/09/2018 FINDINGS: Initial images demonstrate the fracture involving the right femoral neck. A right hip arthroplasty was performed. The entire femoral stem is visualized. No evidence for a periprosthetic fracture. Femoral head appears to be located on this single view examination. IMPRESSION: Intraoperative images for right hip arthroplasty. Electronically Signed   By: Markus Daft M.D.   On: 09/10/2018 15:59   Dg Hip Unilat  With Pelvis 2-3 Views Right  Result Date: 09/09/2018 CLINICAL DATA:  Patient fell 2 hours ago. Right hip pain. EXAM: DG HIP (WITH OR WITHOUT PELVIS) 2-3V RIGHT COMPARISON:  None. FINDINGS: Acute varus angulated transcervical fracture of the right femur. No joint dislocation. Levoconvex curvature of the lumbar spine with transitional vertebral anatomy an assimilation joint seen on right. Multilevel degenerative disc disease of the included lumbar spine is noted. No pelvic fracture or diastasis. Native left hip appears intact. IMPRESSION: 1. Acute varus angulated transcervical fracture of the right femur. 2. Lumbar spondylosis with levoscoliosis. Transitional lumbosacral vertebral anatomy with assimilation joint on the right. Electronically Signed   By: Ashley Royalty M.D.   On: 09/09/2018 19:43     LOS: 3 days   Deboraha Sprang, PA-S 09/12/2018, 9:57 AM  Agrees with the above. See my progress note from today for more.

## 2018-09-12 NOTE — Progress Notes (Signed)
Patient was being assisted from the [facility] back to bed when he became unresponsive. Staff present was able to place him back on the [facility]. Patient's eyes were glazed over and rolled back. Sternal rub initiated and oxygen placed. RR was called. BP was 190/100. With continued sternal rub patient became responsive and could state his name and where he was. He stated he felt dizzy when he stood but had no further recollection of incident.  V/S at 0350 HR 84, BP 139/83, O2 Sats 100% on 2L. Provider on call was notified by RR nurse. No further instructions given. CPAP placed and patient is resting.Instructed to call for assistance as needed.

## 2018-09-12 NOTE — Care Management Important Message (Signed)
Important Message  Patient Details  Name: Curtis Riley MRN: [id number] Date of Birth: [birth year]   Medicare Important Message Given:  Yes    Kerin Salen 09/12/2018, 1:04 [place] Message  Patient Details  Name: Curtis Riley MRN: [id number] Date of Birth: [birth year]   Medicare Important Message Given:  Yes    Kerin Salen 09/12/2018, 1:04 PM

## 2018-09-12 NOTE — Evaluation (Addendum)
Occupational Therapy Evaluation Patient Details Name: Curtis Riley MRN: [id number] DOB: October 17, 1934 Today's Date: 09/12/2018    History of Present Illness 84 year old male with history of COPD, OSA on CPAP, hypertension, CKD 3 and chronic back pain with radiculopathy admitted after mechanical fall and subsequent right hip fracture.  Pt s/p R direct anterior THA 09/10/18   Clinical Impression   This 84 y/o male presents with the above. At baseline pt is mod independent with ADL and functional mobility using SPC. Pt presents supine in bed pleasant and willing to participate in therapy session. Pt performing bed mobility with overall minA, able to sit EOB during session with minguard assist for static balance. Pt currently requires minguard assist for seated UB ADL, mod-maxA for LB ADL. Given pt with previous dizziness and syncopal episode last PM Pt's BP monitored during session: Supine start of session: 94/55 Seated EOB: 104/52 End of session after return to supine: 96/56. (No c/o dizziness throughout session)   He will benefit from continued acute OT services and recommend follow up therapy services in SNF setting after discharge to maximize his safety and independence with ADL and mobility prior to return home. Will follow.     Follow Up Recommendations  SNF;Supervision/Assistance - 24 hour    Equipment Recommendations  3 in 1 bedside commode;Other (comment)(TBD in next venue)           Precautions / Restrictions Precautions Precautions: Fall Restrictions Weight Bearing Restrictions: Yes RLE Weight Bearing: Weight bearing as tolerated      Mobility Bed Mobility Overal bed mobility: Needs Assistance Bed Mobility: Supine to Sit;Sit to Supine     Supine to sit: Min assist;HOB elevated Sit to supine: Min assist;Mod assist   General bed mobility comments: assist for RLE management to/from supine, light assist for trunk when returning to supine  Transfers                  General transfer comment: deferred due to only +1 assist and pt with vasovagal episode last evening; pt able to lateral scoot along EOB towards HOB with minguard assist     Balance Overall balance assessment: History of Falls;Needs assistance Sitting-balance support: Feet supported;No upper extremity supported Sitting balance-Leahy Scale: Fair                                     ADL either performed or assessed with clinical judgement   ADL Overall ADL's : Needs assistance/impaired Eating/Feeding: Modified independent;Sitting   Grooming: Set up;Sitting   Upper Body Bathing: Min guard;Sitting   Lower Body Bathing: Moderate assistance;Sitting/lateral leans   Upper Body Dressing : Min guard;Sitting   Lower Body Dressing: Maximal assistance;Sitting/lateral leans                 General ADL Comments: limited session to EOB as pt soon to be taken down for MRI and pt with vasovagal episode last evening. Pt tolerating well, BP soft but stable                  Pertinent Vitals/Pain Pain Assessment: Faces Faces Pain Scale: Hurts little more Pain Location: R hip with movement  Pain Descriptors / Indicators: Sore;Discomfort Pain Intervention(s): Limited activity within patient's tolerance;Monitored during session;Repositioned     Hand Dominance     Extremity/Trunk Assessment Upper Extremity Assessment Upper Extremity Assessment: Generalized weakness   Lower Extremity Assessment Lower Extremity Assessment: Defer to  PT evaluation       Communication Communication Communication: No difficulties   Cognition Arousal/Alertness: Awake/alert Behavior During Therapy: WFL for tasks assessed/performed Overall Cognitive Status: Within Functional Limits for tasks assessed                                     General Comments       Exercises     Shoulder Instructions      Home Living Family/patient expects to be discharged to::  Skilled nursing facility Living Arrangements: Alone                                      Prior Functioning/Environment Level of Independence: Independent with assistive device(s)        Comments: uses SPC, reports increased falls lately        OT Problem List: Decreased strength;Decreased range of motion;Decreased activity tolerance;Impaired balance (sitting and/or standing);Decreased knowledge of use of DME or AE;Pain      OT Treatment/Interventions: Therapeutic exercise;Self-care/ADL training;DME and/or AE instruction;Therapeutic activities;Patient/family education;Balance training    OT Goals(Current goals can be found in the care plan section) Acute Rehab OT Goals Patient Stated Goal: to regain independence OT Goal Formulation: With patient Time For Goal Achievement: 09/26/18 Potential to Achieve Goals: Good  OT Frequency: Min 2X/week   Barriers to D/C:            Co-evaluation              AM-PAC OT "6 Clicks" Daily Activity     Outcome Measure Help from another person eating meals?: None Help from another person taking care of personal grooming?: A Little Help from another person toileting, which includes using toliet, bedpan, or urinal?: A Lot Help from another person bathing (including washing, rinsing, drying)?: A Lot Help from another person to put on and taking off regular upper body clothing?: A Little Help from another person to put on and taking off regular lower body clothing?: A Lot 6 Click Score: 16   End of Session Nurse Communication: Mobility status(pt BP with EOB activity)  Activity Tolerance: Patient tolerated treatment well Patient left: in bed;with call bell/phone within reach;with bed alarm set;with family/visitor present  OT Visit Diagnosis: Muscle weakness (generalized) (M62.81);History of falling (Z91.81)                Time: 5993-5701 OT Time Calculation (min): 24 min Charges:  OT General Charges $OT Visit: 1  Visit OT Evaluation $OT Eval Moderate Complexity: 1 Mod OT Treatments $Self Care/Home Management : 8-22 mins  Lou Cal, OT [facility] Pager [phone] Office [phone]   Raymondo Band 09/12/2018, 11:32 AM

## 2018-09-12 NOTE — Progress Notes (Signed)
PROGRESS NOTE  CHIEF WALKUP OVF:[id number] DOB: [birth year] DOA: 09/09/2018 PCP: Denita Lung, MD   LOS: 3 days   Brief Narrative / Interim history: 84 year old male with history of COPD, OSA on CPAP, hypertension, CKD 3 and chronic back pain with radiculopathy admitted after mechanical fall and subsequent right hip fracture.  Patient reports 3 incidents of falls in the last 1 month with unsteady gait.  No prodromes or cardiopulmonary symptoms.  Followed by orthopedic surgery.  Plan was to get MRI lumbar spine on 2/23.  He denies fever, unintentional weight loss, fecal incontinence or urinary retention.  In ED, vitals, CBC and CMP not impressive.  EKG sinus rhythm with RAD and LAFB.  Chest x-ray not impressive.  Right hip x-ray showed acute varus angulated transcervical fracture of the right femur and some arthritic changes of lumbosacral vertebras.  Patient had total hip arthroplasty on 2/22.  Postop course complicated by low blood pressure and presyncope on 2/23.  Adjustments were made to his blood pressure medications.  He was also given normal saline bolus 500 cc.  He had a syncopal episode the night of 2/23-2/24 after having bowel movement which was thought to be vasovagal.  Subjective: Had a syncopal episode overnight after having bowel movement which was thought to be vasovagal.  Currently no complaints.  Pain fairly controlled.  Denies chest pain, shortness of breath, lightheadedness, palpitation, nausea, vomiting, abdominal pain or dysuria.  Assessment & Plan: Principal Problem:   Closed right hip fracture, initial encounter [facility]) Active Problems:   Chronic obstructive pulmonary disease (HCC)   OSA on CPAP   Chronic back pain   Hypertension   COPD (chronic obstructive pulmonary disease) (HCC)   CKD (chronic kidney disease) stage 3, GFR 30-59 ml/min (HCC)  Mechanical fall Closed right hip fracture Chronic back pain/radiculopathy/unsteady gait -Status post total hip  arthroplasty on 2/22 -Pain control -Aspirin 81 mg twice daily and SCD for VT prophylaxis -MRI lumbar spine  -PT-OT eval-SNF  Vasovagal syncope: the night of of 2/23-2/24.  Currently without cardiopulmonary symptoms. -We will move to telemetry if cardiopulmonary symptoms -Hold home lisinopril and Coreg -Low-dose metoprolol-cardioselective  Chronic COPD: Had wheezing this morning that has resolved with DuoNeb.  No cough or shortness of breath. -Continue DuoNeb -DC Coreg -Low-dose metoprolol-cardioselective  Hypertension: Normotensive this morning after syncopal episode last night -Blood pressure medications as above.  CKD 3: Slight uptrend in his serum creatinine likely prerenal -Hold home lisinopril -Continue monitoring  OSA: wears CPAP at night -Nightly CPAP  Scheduled Meds: . aspirin  81 mg Oral BID  . docusate sodium  100 mg Oral BID  . feeding supplement (ENSURE ENLIVE)  237 mL Oral BID BM  . ipratropium-albuterol  3 mL Nebulization BID  . lisinopril  40 mg Oral Daily  . metoprolol tartrate  12.5 mg Oral BID  . pantoprazole  40 mg Oral Daily   Continuous Infusions: . sodium chloride Stopped (09/11/18 1352)  . methocarbamol (ROBAXIN) IV     PRN Meds:.acetaminophen, alum & mag hydroxide-simeth, HYDROmorphone (DILAUDID) injection, ipratropium-albuterol, menthol-cetylpyridinium **OR** phenol, methocarbamol **OR** methocarbamol (ROBAXIN) IV, metoCLOPramide **OR** metoCLOPramide (REGLAN) injection, ondansetron **OR** ondansetron (ZOFRAN) IV, oxyCODONE, oxyCODONE  DVT prophylaxis: Aspirin and SCD Code Status: Full code Family Communication: None at bedside Disposition Plan: Remains inpatient  Consultants:   Orthopedic surgery  Procedures:   THA 2/22  Antimicrobials:  Cefazolin perioperatively  Objective: Vitals:   09/11/18 2110 09/12/18 0348 09/12/18 0851 09/12/18 0933  BP:  139/83  115/69  Pulse:  79  90  Resp:    14  Temp:    98 F (36.7 C)  TempSrc:     Oral  SpO2: 93% 100% 95% 96%  Weight:      Height:        Intake/Output Summary (Last 24 hours) at 09/12/2018 1357 Last data filed at 09/12/2018 1258 Gross per 24 hour  Intake 1503.62 ml  Output 475 ml  Net 1028.62 ml   Filed Weights   09/09/18 1856 09/09/18 2323  Weight: 119.7 kg 103.5 kg    Examination:  GENERAL: Appears well. No acute distress.  HEENT: MMM.  Vision and Hearing grossly intact.  NECK: Supple.  No JVD.  LUNGS:  No IWOB. Good air movement. CTAB.  HEART:  RRR. Heart sounds normal.  ABD: Bowel sounds present. Soft. Non tender.  EXT:   no edema bilaterally.  Moves both extremities. SKIN: no apparent skin lesion.  NEURO: Awake, alert and oriented appropriately.  No gross deficit.  PSYCH: Calm. Normal affect. Data Reviewed: I have independently reviewed following labs and imaging studies  CBC: Recent Labs  Lab 09/09/18 2108 09/11/18 0420 09/12/18 0434  WBC 9.3 9.4 9.9  NEUTROABS 7.7  --   --   HGB 13.3 11.4* 10.4*  HCT 43.0 37.8* 35.2*  MCV 100.0 103.0* 104.1*  PLT 197 163 465*   Basic Metabolic Panel: Recent Labs  Lab 09/09/18 2108 09/11/18 0420 09/12/18 0434  NA 140 140 138  K 5.1 4.4 4.1  CL 108 108 109  CO2 24 24 22   GLUCOSE 83 137* 113*  BUN 17 26* 33*  CREATININE 1.56* 1.51* 1.74*  CALCIUM 9.2 8.6* 8.4*   GFR: Estimated Creatinine Clearance: 40.9 mL/min (A) (by C-G formula based on SCr of 1.74 mg/dL (H)). Liver Function Tests: Recent Labs  Lab 09/09/18 2108  AST 36  ALT 19  ALKPHOS 74  BILITOT 1.6*  PROT 6.9  ALBUMIN 3.7   No results for input(s): LIPASE, AMYLASE in the last 168 hours. No results for input(s): AMMONIA in the last 168 hours. Coagulation Profile: Recent Labs  Lab 09/09/18 2108  INR 1.07   Cardiac Enzymes: No results for input(s): CKTOTAL, CKMB, CKMBINDEX, TROPONINI in the last 168 hours. BNP (last 3 results) No results for input(s): PROBNP in the last 8760 hours. HbA1C: No results for input(s): HGBA1C  in the last 72 hours. CBG: Recent Labs  Lab 09/12/18 0345  GLUCAP 126*   Lipid Profile: No results for input(s): CHOL, HDL, LDLCALC, TRIG, CHOLHDL, LDLDIRECT in the last 72 hours. Thyroid Function Tests: No results for input(s): TSH, T4TOTAL, FREET4, T3FREE, THYROIDAB in the last 72 hours. Anemia Panel: No results for input(s): VITAMINB12, FOLATE, FERRITIN, TIBC, IRON, RETICCTPCT in the last 72 hours. Urine analysis: No results found for: COLORURINE, APPEARANCEUR, LABSPEC, PHURINE, GLUCOSEU, HGBUR, BILIRUBINUR, KETONESUR, PROTEINUR, UROBILINOGEN, NITRITE, LEUKOCYTESUR Sepsis Labs: Invalid input(s): PROCALCITONIN, LACTICIDVEN  Recent Results (from the past 240 hour(s))  Surgical pcr screen     Status: None   Collection Time: 09/09/18 10:25 PM  Result Value Ref Range Status   MRSA, PCR NEGATIVE NEGATIVE Final   Staphylococcus aureus NEGATIVE NEGATIVE Final    Comment: (NOTE) The Xpert SA Assay (FDA approved for NASAL specimens in patients 55 years of age and older), is one component of a comprehensive surveillance program. It is not intended to diagnose infection nor to guide or monitor treatment. Performed at [facility], [place] [address] [place], [place] [address]  Radiology Studies: No results found.  [facility] Pager [phone]  If 7PM-7AM, please contact night-coverage [email or web address] Password [facility] 09/12/2018, 1:57 PM

## 2018-09-12 NOTE — Discharge Instructions (Signed)

## 2018-09-12 NOTE — Significant Event (Signed)
Rapid Response Event Note  Overview: Time Called: 0336 Arrival Time: 0340 Event Type: Other (Comment)  Initial Focused Assessment: Patient up to bed side commode, had episode of loss of consciousness. Patient stated he felt dizzy before event. Patient had bowel movement into bedside commode.   Interventions: Patient stood and helped back to bed from bedside commode. Once back in bed, CPAP 2L reapplied. BP 126/83 (93), HR 96 appears to be NSR, O2 98%, and RR 20. Patient still feels warm, moist to touch. Patient states he feels better, no complaints or discomforts at this time.  Plan of Care (if not transferred): NP made aware of event. NP presumes patient had a vasovagal episode while up to bedside commode. Patient educated not to get up without assistance, not to strain with bowel movements, and recommended to use bedpan and urinal with further bathroom needs tonight. Patient currently on stool softener regimen. RN to call rapid response for further needs.  Event Summary:  Curtis Riley

## 2018-09-13 LAB — COMPREHENSIVE METABOLIC PANEL
ALT: 16 U/L (ref 0–44)
AST: 22 U/L (ref 15–41)
Albumin: 2.5 g/dL — ABNORMAL LOW (ref 3.5–5.0)
Alkaline Phosphatase: 45 U/L (ref 38–126)
Anion gap: 5 (ref 5–15)
BUN: 39 mg/dL — ABNORMAL HIGH (ref 8–23)
CO2: 27 mmol/L (ref 22–32)
Calcium: 8.5 mg/dL — ABNORMAL LOW (ref 8.9–10.3)
Chloride: 108 mmol/L (ref 98–111)
Creatinine, Ser: 1.72 mg/dL — ABNORMAL HIGH (ref 0.61–1.24)
GFR, EST AFRICAN AMERICAN: 42 mL/min — AB (ref >60)
GFR, EST NON AFRICAN AMERICAN: 36 mL/min — AB (ref >60)
Glucose, Bld: 110 mg/dL — ABNORMAL HIGH (ref 70–99)
Potassium: 4.4 mmol/L (ref 3.5–5.1)
Sodium: 140 mmol/L (ref 135–145)
Total Bilirubin: 1.5 mg/dL — ABNORMAL HIGH (ref 0.3–1.2)
Total Protein: 5.1 g/dL — ABNORMAL LOW (ref 6.5–8.1)

## 2018-09-13 LAB — CBC
HCT: 29.7 % — ABNORMAL LOW (ref 39.0–52.0)
Hemoglobin: 9 g/dL — ABNORMAL LOW (ref 13.0–17.0)
MCH: 31.1 pg (ref 26.0–34.0)
MCHC: 30.3 g/dL (ref 30.0–36.0)
MCV: 102.8 fL — AB (ref 80.0–100.0)
Platelets: 131 10*3/uL — ABNORMAL LOW (ref 150–400)
RBC: 2.89 MIL/uL — ABNORMAL LOW (ref 4.22–5.81)
RDW: 12 % (ref 11.5–15.5)
WBC: 8.7 10*3/uL (ref 4.0–10.5)
nRBC: 0 % (ref 0.0–0.2)

## 2018-09-13 MED ORDER — ASPIRIN 81 MG PO CHEW: 81.0000 mg | CHEWABLE_TABLET | Freq: Two times a day (BID) | ORAL | 0 refills | Status: AC

## 2018-09-13 MED ORDER — METHOCARBAMOL 500 MG PO TABS: 500.0000 mg | ORAL_TABLET | Freq: Four times a day (QID) | ORAL | 0 refills | Status: AC | PRN

## 2018-09-13 MED ORDER — OXYCODONE HCL 5 MG PO TABS: 5.0000 mg | ORAL_TABLET | ORAL | 0 refills | Status: AC | PRN

## 2018-09-13 NOTE — Progress Notes (Signed)
PROGRESS NOTE  Curtis Riley CBJ:[id number] DOB: 1935/07/15 DOA: 09/09/2018 PCP: Denita Lung, MD   LOS: 4 days   Brief Narrative / Interim history: 84 year old male with history of COPD, OSA on CPAP, hypertension, CKD 3 and chronic back pain with radiculopathy admitted after mechanical fall and subsequent right hip fracture.  Patient reports 3 incidents of falls in the last 1 month with unsteady gait.  No prodromes or cardiopulmonary symptoms.  Followed by orthopedic surgery.  Plan was to get MRI lumbar spine on 2/23.  He denies fever, unintentional weight loss, fecal incontinence or urinary retention.  In ED, vitals, CBC and CMP not impressive. EKG sinus rhythm with RAD and LAFB.  Chest x-ray not impressive.  Right hip x-ray showed acute varus angulated transcervical fracture of the right femur and some arthritic changes of lumbosacral vertebras.  Patient had total hip arthroplasty on 2/22.  Postop course complicated by low blood pressure and presyncope on 2/23.  Adjustments were made to his blood pressure medications.  He was also given normal saline bolus 500 cc.  He had a syncopal episode the night of 2/23-2/24 after having bowel movement which was thought to be vasovagal.  Subjective: No major events overnight of this morning.  No complaints this morning.  Denies chest pain, dyspnea, itchiness, nausea, vomiting, abdominal pain or dysuria.  Assessment & Plan: Principal Problem:   Closed right hip fracture, initial encounter [facility]) Active Problems:   Chronic obstructive pulmonary disease (HCC)   OSA on CPAP   Chronic back pain   Hypertension   COPD (chronic obstructive pulmonary disease) (HCC)   CKD (chronic kidney disease) stage 3, GFR 30-59 ml/min (HCC)  Mechanical fall Closed right hip fracture Chronic back pain/radiculopathy/unsteady gait -MRI lumbar spine with significant DDD and foraminal stenosis particularly at L5-S1. -Status post total hip arthroplasty on 2/22 -Pain  control -Aspirin 81 mg twice daily and SCD for VT prophylaxis -Outpatient follow-up with his orthopedic surgeon for back pain and MRI finding. -PT-OT eval-SNF  Vasovagal syncope: the night of of 2/23-2/24.  Currently without cardiopulmonary symptoms. -We will move to telemetry if cardiopulmonary symptoms -Hold home lisinopril and Coreg -Metoprolol-cardioselective-May discharge on metoprolol  Acute on chronic blood loss anemia/mild thrombocytopenia: Hemoglobin downtrending.  Reports melanotic stool.  Low risk for HIT -Hemoccult -Recheck CBC in the morning  Chronic COPD: Had wheezing this morning that has resolved with DuoNeb.  No cough or shortness of breath. -Continue DuoNeb -DC Coreg -Low-dose metoprolol-cardioselective  Hypertension: Normotensive this morning after syncopal episode last night -Blood pressure medications as above.  CKD 3: Slight uptrend in his serum creatinine likely prerenal.  Currently stable. -Consider holding Lisinopril at discharge -Continue monitoring  OSA: wears CPAP at night -Nightly CPAP  Scheduled Meds: . aspirin  81 mg Oral BID  . docusate sodium  100 mg Oral BID  . feeding supplement (ENSURE ENLIVE)  237 mL Oral BID BM  . ipratropium-albuterol  3 mL Nebulization BID  . metoprolol tartrate  25 mg Oral BID  . pantoprazole  40 mg Oral Daily   Continuous Infusions: . methocarbamol (ROBAXIN) IV     PRN Meds:.acetaminophen, alum & mag hydroxide-simeth, HYDROmorphone (DILAUDID) injection, ipratropium-albuterol, menthol-cetylpyridinium **OR** phenol, methocarbamol **OR** methocarbamol (ROBAXIN) IV, metoCLOPramide **OR** metoCLOPramide (REGLAN) injection, ondansetron **OR** ondansetron (ZOFRAN) IV, oxyCODONE, oxyCODONE  DVT prophylaxis: Aspirin and SCD Code Status: Full code Family Communication: None at bedside Disposition Plan: Remains inpatient.  Anticipate discharge to SNF on 2/26 if hemoglobin stays stable.  Consultants:  Orthopedic  surgery  Procedures:   THA 2/22  Antimicrobials:  Cefazolin perioperatively  Objective: Vitals:   09/12/18 2138 09/12/18 2212 09/13/18 0533 09/13/18 0832  BP:  112/69 115/64   Pulse:  91 83 81  Resp:  18 18 18   Temp:  98.9 F (37.2 C) 99.8 F (37.7 C)   TempSrc:  Oral Axillary   SpO2: 92% 97% 100% 92%  Weight:      Height:        Intake/Output Summary (Last 24 hours) at 09/13/2018 1320 Last data filed at 09/13/2018 1100 Gross per 24 hour  Intake 640 ml  Output 475 ml  Net 165 ml   Filed Weights   09/09/18 1856 09/09/18 2323  Weight: 119.7 kg 103.5 kg    Examination:  GENERAL: Appears well. No acute distress.  HEENT: MMM.  Vision and Hearing grossly intact.  NECK: Supple.  No JVD.  LUNGS:  No IWOB. Good air movement. CTAB.  HEART:  RRR. Heart sounds normal.  ABD: Bowel sounds present. Soft. Non tender.  EXT:   no edema bilaterally.  SKIN: no apparent skin lesion.  NEURO: Awake, alert and oriented appropriately.  No gross deficit.  PSYCH: Calm. Normal affect.  Data Reviewed: I have independently reviewed following labs and imaging studies  CBC: Recent Labs  Lab 09/09/18 2108 09/11/18 0420 09/12/18 0434 09/13/18 0342  WBC 9.3 9.4 9.9 8.7  NEUTROABS 7.7  --   --   --   HGB 13.3 11.4* 10.4* 9.0*  HCT 43.0 37.8* 35.2* 29.7*  MCV 100.0 103.0* 104.1* 102.8*  PLT 197 163 136* 500*   Basic Metabolic Panel: Recent Labs  Lab 09/09/18 2108 09/11/18 0420 09/12/18 0434 09/13/18 0342  NA 140 140 138 140  K 5.1 4.4 4.1 4.4  CL 108 108 109 108  CO2 24 24 22 27   GLUCOSE 83 137* 113* 110*  BUN 17 26* 33* 39*  CREATININE 1.56* 1.51* 1.74* 1.72*  CALCIUM 9.2 8.6* 8.4* 8.5*   GFR: Estimated Creatinine Clearance: 41.4 mL/min (A) (by C-G formula based on SCr of 1.72 mg/dL (H)). Liver Function Tests: Recent Labs  Lab 09/09/18 2108 09/13/18 0342  AST 36 22  ALT 19 16  ALKPHOS 74 45  BILITOT 1.6* 1.5*  PROT 6.9 5.1*  ALBUMIN 3.7 2.5*   No results for  input(s): LIPASE, AMYLASE in the last 168 hours. No results for input(s): AMMONIA in the last 168 hours. Coagulation Profile: Recent Labs  Lab 09/09/18 2108  INR 1.07   Cardiac Enzymes: No results for input(s): CKTOTAL, CKMB, CKMBINDEX, TROPONINI in the last 168 hours. BNP (last 3 results) No results for input(s): PROBNP in the last 8760 hours. HbA1C: No results for input(s): HGBA1C in the last 72 hours. CBG: Recent Labs  Lab 09/12/18 0345  GLUCAP 126*   Lipid Profile: No results for input(s): CHOL, HDL, LDLCALC, TRIG, CHOLHDL, LDLDIRECT in the last 72 hours. Thyroid Function Tests: No results for input(s): TSH, T4TOTAL, FREET4, T3FREE, THYROIDAB in the last 72 hours. Anemia Panel: No results for input(s): VITAMINB12, FOLATE, FERRITIN, TIBC, IRON, RETICCTPCT in the last 72 hours. Urine analysis: No results found for: COLORURINE, APPEARANCEUR, LABSPEC, PHURINE, GLUCOSEU, HGBUR, BILIRUBINUR, KETONESUR, PROTEINUR, UROBILINOGEN, NITRITE, LEUKOCYTESUR Sepsis Labs: Invalid input(s): PROCALCITONIN, LACTICIDVEN  Recent Results (from the past 240 hour(s))  Surgical pcr screen     Status: None   Collection Time: 09/09/18 10:25 PM  Result Value Ref Range Status   MRSA, PCR NEGATIVE NEGATIVE Final  Staphylococcus aureus NEGATIVE NEGATIVE Final    Comment: (NOTE) The Xpert SA Assay (FDA approved for NASAL specimens in patients 60 years of age and older), is one component of a comprehensive surveillance program. It is not intended to diagnose infection nor to guide or monitor treatment. Performed at [facility], [place] [address], [place], [place] [address]       Radiology Studies: Mr Lumbar Spine Wo Contrast  Result Date: 09/12/2018 CLINICAL DATA:  Back pain for 6 weeks. Recent fall with hip fracture. EXAM: MRI LUMBAR SPINE WITHOUT CONTRAST TECHNIQUE: Multiplanar, multisequence MR imaging of the lumbar spine was performed. No intravenous contrast was  administered. COMPARISON:  Lumbar spine radiograph 12/15/2017 FINDINGS: Segmentation: Normal. The lowest disc space is considered to be L5-S1. Alignment:  No static subluxation Vertebrae: No acute compression fracture, discitis-osteomyelitis of focal marrow lesion. Conus medullaris and cauda equina: The conus medullaris terminates at the L1 level. The cauda equina and conus medullaris are both normal. Paraspinal and other soft tissues: The visualized retroperitoneal organs and paraspinal soft tissues are normal. Disc levels: Sagittal plane imaging includes the T10-11 disc level through the upper sacrum, with axial imaging of the T12-L1 to L5-S1 disc levels. T10-11: Normal. T11-12: There is a synovial cyst at the right facet joint that measures 11 by 8 mm. There is a small amount of fluid in left facet. There is mild left foraminal stenosis. T12-L1: Mild disc bulge and facet hypertrophy without spinal canal stenosis. Mild right foraminal narrowing. L1-2: Small disc bulge with mild facet hypertrophy. Mild right foraminal stenosis. No spinal canal stenosis. L2-3: Intermediate disc bulge without spinal canal stenosis. Mild narrowing of the right neural foramen. L3-4: Disc space narrowing with small diffuse disc bulge and mild facet hypertrophy. No spinal canal stenosis. Mild right foraminal stenosis. L4-5: Severe disc space loss with small central disc protrusion superimposed on diffuse mild bulge. Mild facet arthrosis. Moderate right and severe left neural foraminal stenosis. Mild narrowing of both lateral recesses but no central spinal canal stenosis. L5-S1: No disc herniation. No spinal canal or neural foraminal stenosis. The visualized portion of the sacrum is normal. IMPRESSION: 1. Multilevel degenerative disc disease without spinal canal stenosis. No acute abnormality. 2. 11 x 8 mm synovial cyst arising from the right T11-12 facet. No visible stenosis, but this level is imaged only in the sagittal plane. 3.  Bilateral L4-5 neural foraminal stenosis is moderate-to-severe. 4. Otherwise mild multilevel neural foraminal stenosis. Electronically Signed   By: Ulyses Jarred M.D.   On: 09/12/2018 14:27    Ashritha Desrosiers T. [facility] Pager [phone]  If 7PM-7AM, please contact night-coverage [email or web address] Password [email or web address] 09/13/2018, 1:20 PM

## 2018-09-13 NOTE — Progress Notes (Signed)
Physical Therapy Treatment Patient Details Name: Curtis Riley MRN: [id number] DOB: 1934-09-25 Today's Date: 09/13/2018    History of Present Illness 84 year old male with history of COPD, OSA on CPAP, hypertension, CKD 3 and chronic back pain with radiculopathy admitted after mechanical fall and subsequent right hip fracture.  Pt s/p R direct anterior THA 09/10/18    PT Comments    Pt assisted with ambulating in hallway short distance.  Pt limited by generalized weakness and fatigue.  Continue to recommend SNF upon d/c.  Follow Up Recommendations  SNF     Equipment Recommendations  Rolling walker with 5" wheels    Recommendations for Other Services       Precautions / Restrictions Precautions Precautions: Fall Restrictions Weight Bearing Restrictions: No RLE Weight Bearing: Weight bearing as tolerated    Mobility  Bed Mobility Overal bed mobility: Needs Assistance Bed Mobility: Supine to Sit     Supine to sit: Min assist;HOB elevated     General bed mobility comments: assist for R LE, increased time and effort  Transfers Overall transfer level: Needs assistance Equipment used: Rolling walker (2 wheeled) Transfers: Sit to/from Stand Sit to Stand: Min assist;+2 physical assistance         General transfer comment: verbal cues for UE and LE positioning; assist to rise and steady as well as control descent  Ambulation/Gait Ambulation/Gait assistance: +2 physical assistance;Min assist;+2 safety/equipment Gait Distance (Feet): 30 Feet Assistive device: Rolling walker (2 wheeled) Gait Pattern/deviations: Step-to pattern;Decreased stance time - right;Antalgic     General Gait Details: verbal cues for sequence, step length, RW positioning, use of UEs on RW, recliner following closely for safety   Stairs             Wheelchair Mobility    Modified Rankin (Stroke Patients Only)       Balance                                            Cognition Arousal/Alertness: Awake/alert Behavior During Therapy: WFL for tasks assessed/performed Overall Cognitive Status: Within Functional Limits for tasks assessed                                        Exercises      General Comments        Pertinent Vitals/Pain Pain Assessment: 0-10 Pain Score: 5  Pain Location: R hip with movement  Pain Descriptors / Indicators: Sore;Discomfort Pain Intervention(s): Limited activity within patient's tolerance;Monitored during session;Repositioned;Ice applied    Home Living                      Prior Function            PT Goals (current goals can now be found in the care plan section) Progress towards PT goals: Progressing toward goals    Frequency    Min 3X/week      PT Plan Current plan remains appropriate    Co-evaluation              AM-PAC PT "6 Clicks" Mobility   Outcome Measure  Help needed turning from your back to your side while in a flat bed without using bedrails?: A Little Help needed moving from lying on your back to sitting  on the side of a flat bed without using bedrails?: A Lot Help needed moving to and from a bed to a chair (including a wheelchair)?: A Lot Help needed standing up from a chair using your arms (e.g., wheelchair or bedside chair)?: A Lot Help needed to walk in hospital room?: A Lot Help needed climbing 3-5 steps with a railing? : Total 6 Click Score: 12    End of Session Equipment Utilized During Treatment: Gait belt Activity Tolerance: Patient tolerated treatment well Patient left: in chair;with chair alarm set;with call bell/phone within reach;with family/visitor present Nurse Communication: Mobility status PT Visit Diagnosis: Difficulty in walking, not elsewhere classified (R26.2);Unsteadiness on feet (R26.81)     Time: 9470-9628 PT Time Calculation (min) (ACUTE ONLY): 15 min  Charges:  $Gait Training: 8-22 mins                     Carmelia Bake, PT, DPT Acute Rehabilitation Services Office: [phone] Pager: [phone]  Trena Platt 09/13/2018, 2:08 PM

## 2018-09-13 NOTE — Progress Notes (Signed)
Subjective: 3 Days Post-Op Procedure(s) (LRB): TOTAL HIP ARTHROPLASTY ANTERIOR APPROACH (Right) Patient reports pain as moderate.  Working with therapy.  He did get a MRI of his L-spine yesterday due to long standing back pain issues.  It does show L4/L5 stenosis.   Objective: Vital signs in last 24 hours: Temp:  [98 F (36.7 C)-99.8 F (37.7 C)] 99.8 F (37.7 C) (02/25 0533) Pulse Rate:  [83-104] 83 (02/25 0533) Resp:  [14-18] 18 (02/25 0533) BP: (90-127)/(57-78) 115/64 (02/25 0533) SpO2:  [92 %-100 %] 100 % (02/25 0533)  Intake/Output from previous day: 02/24 0701 - 02/25 0700 In: 520 [P.O.:520] Out: 525 [Urine:525] Intake/Output this shift: No intake/output data recorded.  Recent Labs    09/11/18 0420 09/12/18 0434 09/13/18 0342  HGB 11.4* 10.4* 9.0*   Recent Labs    09/12/18 0434 09/13/18 0342  WBC 9.9 8.7  RBC 3.38* 2.89*  HCT 35.2* 29.7*  PLT 136* 131*   Recent Labs    09/12/18 0434 09/13/18 0342  NA 138 140  K 4.1 4.4  CL 109 108  CO2 22 27  BUN 33* 39*  CREATININE 1.74* 1.72*  GLUCOSE 113* 110*  CALCIUM 8.4* 8.5*   No results for input(s): LABPT, INR in the last 72 hours.  Sensation intact distally Intact pulses distally Dorsiflexion/Plantar flexion intact Incision: dressing C/D/I No cellulitis present Compartment soft   Assessment/Plan: 3 Days Post-Op Procedure(s) (LRB): TOTAL HIP ARTHROPLASTY ANTERIOR APPROACH (Right) Up with therapy  Will follow the lumbar spine as an outpatient - will eventually need an epidural steroid injection at the L4/L5 level as an outpatient once he is further out from his hip surgery. Can be discharged from Ortho standpoint - may need SNF.      Mcarthur Rossetti 09/13/2018, 7:20 AM

## 2018-09-13 NOTE — Clinical Social Work Placement (Addendum)
   CLINICAL SOCIAL WORK PLACEMENT  NOTE  Date:  09/13/2018  Patient Details  Name: Curtis Riley MRN: [id number] Date of Birth: Sep 25, 1934  Clinical Social Work is seeking post-discharge placement for this patient at the [place] level of care (*CSW will initial, date and re-position this form in  chart as items are completed):  Yes   Patient/family provided with [place] Work Department's list of facilities offering this level of care within the geographic area requested by the patient (or if unable, by the patient's family).  Yes   Patient/family informed of their freedom to choose among providers that offer the needed level of care, that participate in Medicare, Medicaid or managed care program needed by the patient, have an available bed and are willing to accept the patient.  Yes   Patient/family informed of [place]'s ownership interest in [facility] and [facility], as well as of the fact that they are under no obligation to receive care at these facilities.  PASRR submitted to EDS on       PASRR number received on       Existing PASRR number confirmed on 09/13/18     FL2 transmitted to all facilities in geographic area requested by pt/family on       FL2 transmitted to all facilities within larger geographic area on 09/11/18     Patient informed that his/her managed care company has contracts with or will negotiate with certain facilities, including the following:        Yes   Patient/family informed of bed offers received.  Patient chooses bed at    [facility]    Physician recommends and patient chooses bed at      Patient to be transferred to  [facility]  on 09/14/18.  Patient to be transferred to facility by [facility]     Patient family notified on 09/14/18 of transfer.  Name of family member notified:  Spouse and daughter at bedside     PHYSICIAN Please prepare priority discharge summary, including medications      Additional Comment:    _______________________________________________ Lia Hopping, LCSW 09/13/2018, 11:12 AM

## 2018-09-14 DIAGNOSIS — S72044D Nondisplaced fracture of base of neck of right femur, subsequent encounter for closed fracture with routine healing: Secondary | ICD-10-CM | POA: Diagnosis not present

## 2018-09-14 DIAGNOSIS — D649 Anemia, unspecified: Secondary | ICD-10-CM | POA: Diagnosis not present

## 2018-09-14 DIAGNOSIS — M549 Dorsalgia, unspecified: Secondary | ICD-10-CM | POA: Diagnosis not present

## 2018-09-14 DIAGNOSIS — Z4789 Encounter for other orthopedic aftercare: Secondary | ICD-10-CM | POA: Diagnosis not present

## 2018-09-14 DIAGNOSIS — M545 Low back pain: Secondary | ICD-10-CM | POA: Diagnosis not present

## 2018-09-14 DIAGNOSIS — E878 Other disorders of electrolyte and fluid balance, not elsewhere classified: Secondary | ICD-10-CM | POA: Diagnosis not present

## 2018-09-14 DIAGNOSIS — S72009A Fracture of unspecified part of neck of unspecified femur, initial encounter for closed fracture: Secondary | ICD-10-CM | POA: Diagnosis not present

## 2018-09-14 DIAGNOSIS — I10 Essential (primary) hypertension: Secondary | ICD-10-CM | POA: Diagnosis not present

## 2018-09-14 DIAGNOSIS — J449 Chronic obstructive pulmonary disease, unspecified: Secondary | ICD-10-CM | POA: Diagnosis not present

## 2018-09-14 DIAGNOSIS — R296 Repeated falls: Secondary | ICD-10-CM | POA: Diagnosis not present

## 2018-09-14 DIAGNOSIS — G4733 Obstructive sleep apnea (adult) (pediatric): Secondary | ICD-10-CM | POA: Diagnosis not present

## 2018-09-14 DIAGNOSIS — Z8781 Personal history of (healed) traumatic fracture: Secondary | ICD-10-CM | POA: Diagnosis not present

## 2018-09-14 DIAGNOSIS — R55 Syncope and collapse: Secondary | ICD-10-CM | POA: Diagnosis not present

## 2018-09-14 DIAGNOSIS — N183 Chronic kidney disease, stage 3 (moderate): Secondary | ICD-10-CM | POA: Diagnosis not present

## 2018-09-14 DIAGNOSIS — M5410 Radiculopathy, site unspecified: Secondary | ICD-10-CM | POA: Diagnosis not present

## 2018-09-14 DIAGNOSIS — G8929 Other chronic pain: Secondary | ICD-10-CM | POA: Diagnosis not present

## 2018-09-14 DIAGNOSIS — S72001A Fracture of unspecified part of neck of right femur, initial encounter for closed fracture: Secondary | ICD-10-CM | POA: Diagnosis not present

## 2018-09-14 LAB — BASIC METABOLIC PANEL
Anion gap: 5 (ref 5–15)
BUN: 39 mg/dL — ABNORMAL HIGH (ref 8–23)
CO2: 25 mmol/L (ref 22–32)
Calcium: 8.6 mg/dL — ABNORMAL LOW (ref 8.9–10.3)
Chloride: 108 mmol/L (ref 98–111)
Creatinine, Ser: 1.53 mg/dL — ABNORMAL HIGH (ref 0.61–1.24)
GFR calc Af Amer: 49 mL/min — ABNORMAL LOW (ref >60)
GFR calc non Af Amer: 42 mL/min — ABNORMAL LOW (ref >60)
Glucose, Bld: 102 mg/dL — ABNORMAL HIGH (ref 70–99)
Potassium: 4.5 mmol/L (ref 3.5–5.1)
SODIUM: 138 mmol/L (ref 135–145)

## 2018-09-14 LAB — CBC
HCT: 30.5 % — ABNORMAL LOW (ref 39.0–52.0)
Hemoglobin: 9.2 g/dL — ABNORMAL LOW (ref 13.0–17.0)
MCH: 31 pg (ref 26.0–34.0)
MCHC: 30.2 g/dL (ref 30.0–36.0)
MCV: 102.7 fL — ABNORMAL HIGH (ref 80.0–100.0)
Platelets: 156 10*3/uL (ref 150–400)
RBC: 2.97 MIL/uL — ABNORMAL LOW (ref 4.22–5.81)
RDW: 12.1 % (ref 11.5–15.5)
WBC: 7.1 10*3/uL (ref 4.0–10.5)
nRBC: 0 % (ref 0.0–0.2)

## 2018-09-14 MED ORDER — OXYCODONE HCL 10 MG PO TABS: 10.0000 mg | ORAL_TABLET | ORAL | 0 refills | Status: AC | PRN

## 2018-09-14 MED ORDER — METOPROLOL TARTRATE 25 MG PO TABS: 25.0000 mg | ORAL_TABLET | Freq: Two times a day (BID) | ORAL | 0 refills | Status: DC

## 2018-09-14 MED ORDER — SORBITOL 70 % SOLN
960.0000 mL | TOPICAL_OIL | Freq: Once | ORAL | Status: DC
  Filled 2018-09-14: qty 473

## 2018-09-14 MED ORDER — BISACODYL 5 MG PO TBEC: 10.0000 mg | DELAYED_RELEASE_TABLET | Freq: Every day | ORAL | 1 refills | Status: AC

## 2018-09-14 MED ORDER — LACTULOSE 10 GM/15ML PO SOLN
20.0000 g | Freq: Once | ORAL | Status: AC
  Administered 2018-09-14: 20 g via ORAL
  Filled 2018-09-14: qty 30

## 2018-09-14 MED ORDER — ONDANSETRON HCL 4 MG PO TABS: 4.0000 mg | ORAL_TABLET | Freq: Four times a day (QID) | ORAL | 0 refills | Status: AC | PRN

## 2018-09-14 MED ORDER — DOCUSATE SODIUM 100 MG PO CAPS: 100.0000 mg | ORAL_CAPSULE | Freq: Two times a day (BID) | ORAL | 0 refills | Status: AC

## 2018-09-14 NOTE — Care Management Note (Signed)
Case Management Note  Patient Details  Name: Curtis Riley MRN: [id number] Date of Birth: 07/10/1935  Subjective/Objective:                  discharge planning  Action/Plan: Plan is to go to snf at dc  Expected Discharge Date:  (unknown)               Expected Discharge Plan:  Skilled Nursing Facility  In-House Referral:  Clinical Social Work  Discharge planning Services  CM Consult  Post Acute Care Choice:    Choice offered to:     DME Arranged:    DME Agency:     HH Arranged:    [place] Agency:     Status of Service:  Completed, signed off  If discussed at [employer], dates discussed:    Additional Comments:  Leeroy Cha, RN 09/14/2018, 10:16 AM

## 2018-09-14 NOTE — Plan of Care (Signed)
  Problem: Education: Goal: Knowledge of General Education information will improve Description Including pain rating scale, medication(s)/side effects and non-pharmacologic comfort measures Outcome: Progressing   Problem: Nutrition: Goal: Adequate nutrition will be maintained Outcome: Progressing   Problem: Coping: Goal: Level of anxiety will decrease Outcome: Progressing   Problem: Pain Managment: Goal: General experience of comfort will improve Outcome: Progressing   Problem: Pain Management: Goal: Pain level will decrease with appropriate interventions Outcome: Progressing

## 2018-09-14 NOTE — Clinical Social Work Placement (Signed)
D/C Summary sent Nurse call report to: [phone] [facility] will transport.   CLINICAL SOCIAL WORK PLACEMENT  NOTE  Date:  09/14/2018  Patient Details  Name: Curtis Riley MRN: [id number] Date of Birth: 20-May-1935  Clinical Social Work is seeking post-discharge placement for this patient at the [place] level of care (*CSW will initial, date and re-position this form in  chart as items are completed):  Yes   Patient/family provided with [place] Work Department's list of facilities offering this level of care within the geographic area requested by the patient (or if unable, by the patient's family).  Yes   Patient/family informed of their freedom to choose among providers that offer the needed level of care, that participate in Medicare, Medicaid or managed care program needed by the patient, have an available bed and are willing to accept the patient.  Yes   Patient/family informed of [place]'s ownership interest in [facility] and [facility], as well as of the fact that they are under no obligation to receive care at these facilities.  PASRR submitted to EDS on       PASRR number received on       Existing PASRR number confirmed on 09/13/18     FL2 transmitted to all facilities in geographic area requested by pt/family on       FL2 transmitted to all facilities within larger geographic area on 09/11/18     Patient informed that his/her managed care company has contracts with or will negotiate with certain facilities, including the following:        Yes   Patient/family informed of bed offers received.  Patient chooses bed at [facility]     Physician recommends and patient chooses bed at      Patient to be transferred to [facility] on 09/13/18.  Patient to be transferred to facility by [facility]     Patient family notified on 09/13/18 of transfer.  Name of family member notified:  Spouse and daughter at bedside      PHYSICIAN Please prepare priority discharge summary, including medications     Additional Comment:    _______________________________________________ Lia Hopping, LCSW 09/14/2018, 11:05 AM

## 2018-09-14 NOTE — Discharge Summary (Signed)
Physician Discharge Summary  Curtis Riley PJK:[id number] DOB: 1934/08/22 DOA: 09/09/2018  PCP: Denita Lung, MD  Admit date: 09/09/2018 Discharge date: 09/14/2018  Admitted From: Home Disposition: Skilled nursing facility recommendations for Outpatient Follow-up:  1. Follow up with PCP in 1-2 weeks 2. Please obtain BMP/CBC in one week 3. Please follow up with Dr. Ninfa Linden  Home Health none Equipment/Devices none  Discharge Condition stable  CODE STATUS full code Diet recommendation: Cardiac  Brief/Interim Summary:84 year old male with history of COPD, OSA on CPAP, hypertension, CKD 3 and chronic back pain with radiculopathy admitted after mechanical fall and subsequent right hip fracture.  Patient reports 3 incidents of falls in the last 1 month with unsteady gait.  No prodromes or cardiopulmonary symptoms.  Followed by orthopedic surgery.  Plan was to get MRI lumbar spine on 2/23.  He denies fever, unintentional weight loss, fecal incontinence or urinary retention.  In ED, vitals, CBC and CMP not impressive. EKG sinus rhythm with RAD and LAFB.  Chest x-ray not impressive.  Right hip x-ray showed acute varus angulated transcervical fracture of the right femur and some arthritic changes of lumbosacral vertebras.  Patient had total hip arthroplasty on 2/22.  Postop course complicated by low blood pressure and presyncope on 2/23.  Adjustments were made to his blood pressure medications.  He was also given normal saline bolus 500 cc.  He had a syncopal episode the night of 2/23-2/24 after having bowel movement which was thought to be vasovagal.  Discharge Diagnoses:  Principal Problem:   Closed right hip fracture, initial encounter [facility]) Active Problems:   Chronic obstructive pulmonary disease (HCC)   OSA on CPAP   Chronic back pain   Hypertension   COPD (chronic obstructive pulmonary disease) (HCC)   CKD (chronic kidney disease) stage 3, GFR 30-59 ml/min (HCC)  Mechanical  fall Closed right hip fracture Chronic back pain/radiculopathy/unsteady gait -MRI lumbar spine with significant DDD and foraminal stenosis particularly at L5-S1. -Status post total hip arthroplasty on 2/22 -Pain control -Aspirin 81 mg twice daily and SCD for VT prophylaxis -Outpatient follow-up with his orthopedic surgeon for back pain and MRI finding. -PT-OT eval-SNF  Vasovagal syncope: the night of of 2/23-2/24.  Currently without cardiopulmonary symptoms.  Coreg has been stopped by the previous provider and started metoprolol due to its cardioselective properties.  Patient will be discharged on metoprolol.  However ACE inhibitor and Lasix and potassium is on hold due to soft blood pressure.  Acute on chronic blood loss anemia/mild hemoglobin has been stable.  Platelets normal.    Chronic COPD: Had wheezing this morning that has resolved with DuoNeb.  No cough or shortness of breath. -Continue DuoNeb -Low-dose metoprolol-cardioselective  Hypertension: Normotensive this morning after syncopal episode last night Continue metoprolol.ACE AND LASIX ON HOLD DUE TO SOFT BP.PLEASE RESTART AS APPROPRIATE.  CKD 3 stable  OSA: wears CPAP at night -Nightly CPAP   Nutrition Problem: Increased nutrient needs Etiology: post-op healing    Signs/Symptoms: estimated needs     Interventions: Ensure Enlive (each supplement provides 350kcal and 20 grams of protein)  Estimated body mass index is 27.77 kg/m as calculated from the following:   Height as of this encounter: 6\' 4"  (1.93 m).   Weight as of this encounter: 103.5 kg.  Discharge Instructions   Allergies as of 09/14/2018      Reactions   Penicillins Anaphylaxis      Medication List    STOP taking these medications   aspirin 81 MG  tablet Replaced by:  aspirin 81 MG chewable tablet   furosemide 40 MG tablet Commonly known as:  LASIX   ibuprofen 200 MG tablet Commonly known as:  ADVIL,MOTRIN   KLOR-CON M10 10 MEQ  tablet Generic drug:  potassium chloride   lisinopril 40 MG tablet Commonly known as:  PRINIVIL,ZESTRIL   NON FORMULARY     TAKE these medications   aspirin 81 MG chewable tablet Chew 1 tablet (81 mg total) by mouth 2 (two) times daily. Replaces:  aspirin 81 MG tablet   bisacodyl 5 MG EC tablet Commonly known as:  DULCOLAX Take 2 tablets (10 mg total) by mouth daily.   carvedilol 25 MG tablet Commonly known as:  COREG TAKE 1 TABLET TWICE A DAY   docusate sodium 100 MG capsule Commonly known as:  COLACE Take 1 capsule (100 mg total) by mouth 2 (two) times daily.   GREEN TEA PO Take 1 tablet by mouth daily.   ipratropium-albuterol 0.5-2.5 (3) MG/3ML Soln Commonly known as:  DUONEB INHALE 1 VIAL VIA NEBULIZER EVERY 4 HOURS AS NEEDED What changed:  See the new instructions.   COMBIVENT RESPIMAT 20-100 MCG/ACT Aers respimat Generic drug:  Ipratropium-Albuterol USE 1 INHALATION EVERY 6 HOURS What changed:  See the new instructions.   methocarbamol 500 MG tablet Commonly known as:  ROBAXIN Take 1 tablet (500 mg total) by mouth every 6 (six) hours as needed for muscle spasms.   nitroGLYCERIN 0.4 MG SL tablet Commonly known as:  NITROSTAT Place 1 tablet (0.4 mg total) under the tongue every 5 (five) minutes as needed for chest pain.   ondansetron 4 MG tablet Commonly known as:  ZOFRAN Take 1 tablet (4 mg total) by mouth every 6 (six) hours as needed for nausea.   oxyCODONE 5 MG immediate release tablet Commonly known as:  Oxy IR/ROXICODONE Take 1-2 tablets (5-10 mg total) by mouth every 4 (four) hours as needed for moderate pain (pain score 4-6).   Oxycodone HCl 10 MG Tabs Take 1-1.5 tablets (10-15 mg total) by mouth every 4 (four) hours as needed for severe pain (pain score 7-10).   OXYGEN Inhale into the lungs. Uses Oxygen at night with CPAP      Follow-up Information    Mcarthur Rossetti, MD. Schedule an appointment as soon as possible for a visit in 2  week(s).   Specialty:  Orthopedic Surgery Contact information: [place] [address] [phone]        Denita Lung, MD Follow up.   Specialty:  Family Medicine Contact information: [place] [address] [phone]        Pixie Casino, MD .   Specialty:  Cardiology Contact information: [address] [place] [address] [phone]          Allergies  Allergen Reactions  . Penicillins Anaphylaxis    Consultations:  Ortho Dr. Ninfa Linden   Procedures/Studies: Mr Lumbar Spine Wo Contrast  Result Date: 09/12/2018 CLINICAL DATA:  Back pain for 6 weeks. Recent fall with hip fracture. EXAM: MRI LUMBAR SPINE WITHOUT CONTRAST TECHNIQUE: Multiplanar, multisequence MR imaging of the lumbar spine was performed. No intravenous contrast was administered. COMPARISON:  Lumbar spine radiograph 12/15/2017 FINDINGS: Segmentation: Normal. The lowest disc space is considered to be L5-S1. Alignment:  No static subluxation Vertebrae: No acute compression fracture, discitis-osteomyelitis of focal marrow lesion. Conus medullaris and cauda equina: The conus medullaris terminates at the L1 level. The cauda equina and conus  medullaris are both normal. Paraspinal and other soft tissues: The visualized retroperitoneal organs and paraspinal soft tissues are normal. Disc levels: Sagittal plane imaging includes the T10-11 disc level through the upper sacrum, with axial imaging of the T12-L1 to L5-S1 disc levels. T10-11: Normal. T11-12: There is a synovial cyst at the right facet joint that measures 11 by 8 mm. There is a small amount of fluid in left facet. There is mild left foraminal stenosis. T12-L1: Mild disc bulge and facet hypertrophy without spinal canal stenosis. Mild right foraminal narrowing. L1-2: Small disc bulge with mild facet hypertrophy. Mild right foraminal stenosis. No spinal canal stenosis. L2-3: Intermediate disc  bulge without spinal canal stenosis. Mild narrowing of the right neural foramen. L3-4: Disc space narrowing with small diffuse disc bulge and mild facet hypertrophy. No spinal canal stenosis. Mild right foraminal stenosis. L4-5: Severe disc space loss with small central disc protrusion superimposed on diffuse mild bulge. Mild facet arthrosis. Moderate right and severe left neural foraminal stenosis. Mild narrowing of both lateral recesses but no central spinal canal stenosis. L5-S1: No disc herniation. No spinal canal or neural foraminal stenosis. The visualized portion of the sacrum is normal. IMPRESSION: 1. Multilevel degenerative disc disease without spinal canal stenosis. No acute abnormality. 2. 11 x 8 mm synovial cyst arising from the right T11-12 facet. No visible stenosis, but this level is imaged only in the sagittal plane. 3. Bilateral L4-5 neural foraminal stenosis is moderate-to-severe. 4. Otherwise mild multilevel neural foraminal stenosis. Electronically Signed   By: Ulyses Jarred M.D.   On: 09/12/2018 14:27   Dg Pelvis Portable  Result Date: 09/10/2018 CLINICAL DATA:  Postoperative EXAM: PORTABLE PELVIS 1-2 VIEWS COMPARISON:  09/10/2018 FINDINGS: Changes of right hip replacement. Normal AP alignment. No hardware or bony complicating feature. IMPRESSION: Right hip replacement.  No complicating feature. Electronically Signed   By: Rolm Baptise M.D.   On: 09/10/2018 17:21   Dg Chest Port 1 View  Result Date: 09/09/2018 CLINICAL DATA:  Preop hip surgery EXAM: PORTABLE CHEST 1 VIEW COMPARISON:  CT 10/26/2017, radiograph 09/24/2016 FINDINGS: Postsurgical changes of the cervical spine. No acute opacity or pleural effusion. Normal cardiomediastinal silhouette. No pneumothorax. IMPRESSION: No active disease. Electronically Signed   By: Donavan Foil M.D.   On: 09/09/2018 20:28   Dg C-arm 1-60 Min-no Report  Result Date: 09/10/2018 Fluoroscopy was utilized by the requesting physician.  No  radiographic interpretation.   Dg Hip Operative Unilat W Or W/o Pelvis Right  Result Date: 09/10/2018 CLINICAL DATA:  Right hip replacement. EXAM: OPERATIVE RIGHT HIP (WITH PELVIS IF PERFORMED) 1 VIEW TECHNIQUE: Fluoroscopic spot image(s) were submitted for interpretation post-operatively. COMPARISON:  09/09/2018 FINDINGS: Initial images demonstrate the fracture involving the right femoral neck. A right hip arthroplasty was performed. The entire femoral stem is visualized. No evidence for a periprosthetic fracture. Femoral head appears to be located on this single view examination. IMPRESSION: Intraoperative images for right hip arthroplasty. Electronically Signed   By: Markus Daft M.D.   On: 09/10/2018 15:59   Dg Hip Unilat  With Pelvis 2-3 Views Right  Result Date: 09/09/2018 CLINICAL DATA:  Patient fell 2 hours ago. Right hip pain. EXAM: DG HIP (WITH OR WITHOUT PELVIS) 2-3V RIGHT COMPARISON:  None. FINDINGS: Acute varus angulated transcervical fracture of the right femur. No joint dislocation. Levoconvex curvature of the lumbar spine with transitional vertebral anatomy an assimilation joint seen on right. Multilevel degenerative disc disease of the included lumbar spine is noted. No pelvic  fracture or diastasis. Native left hip appears intact. IMPRESSION: 1. Acute varus angulated transcervical fracture of the right femur. 2. Lumbar spondylosis with levoscoliosis. Transitional lumbosacral vertebral anatomy with assimilation joint on the right. Electronically Signed   By: Ashley Royalty M.D.   On: 09/09/2018 19:43    (Echo, Carotid, EGD, Colonoscopy, ERCP)    Subjective:   Discharge Exam: Vitals:   09/14/18 0737 09/14/18 0740  BP:    Pulse:    Resp:    Temp:    SpO2: 92% 92%   Vitals:   09/13/18 2141 09/14/18 0540 09/14/18 0737 09/14/18 0740  BP: 116/67 122/76    Pulse: 69 79    Resp: 18 16    Temp: 100.3 F (37.9 C) 98.3 F (36.8 C)    TempSrc: Axillary Oral    SpO2: 99% 99% 92% 92%   Weight:      Height:        General: Pt is alert, awake, not in acute distress Cardiovascular: RRR, S1/S2 +, no rubs, no gallops Respiratory: CTA bilaterally, no wheezing, no rhonchi Abdominal: Soft, NT, ND, bowel sounds + Extremities: no edema, no cyanosis    The results of significant diagnostics from this hospitalization (including imaging, microbiology, ancillary and laboratory) are listed below for reference.     Microbiology: Recent Results (from the past 240 hour(s))  Surgical pcr screen     Status: None   Collection Time: 09/09/18 10:25 PM  Result Value Ref Range Status   MRSA, PCR NEGATIVE NEGATIVE Final   Staphylococcus aureus NEGATIVE NEGATIVE Final    Comment: (NOTE) The Xpert SA Assay (FDA approved for NASAL specimens in patients 59 years of age and older), is one component of a comprehensive surveillance program. It is not intended to diagnose infection nor to guide or monitor treatment. Performed at [facility], [place] [address], [place], [place] [address]      Labs: BNP (last 3 results) No results for input(s): BNP in the last 8760 hours. Basic Metabolic Panel: Recent Labs  Lab 09/09/18 2108 09/11/18 0420 09/12/18 0434 09/13/18 0342 09/14/18 0531  NA 140 140 138 140 138  K 5.1 4.4 4.1 4.4 4.5  CL 108 108 109 108 108  CO2 24 24 22 27 25   GLUCOSE 83 137* 113* 110* 102*  BUN 17 26* 33* 39* 39*  CREATININE 1.56* 1.51* 1.74* 1.72* 1.53*  CALCIUM 9.2 8.6* 8.4* 8.5* 8.6*   Liver Function Tests: Recent Labs  Lab 09/09/18 2108 09/13/18 0342  AST 36 22  ALT 19 16  ALKPHOS 74 45  BILITOT 1.6* 1.5*  PROT 6.9 5.1*  ALBUMIN 3.7 2.5*   No results for input(s): LIPASE, AMYLASE in the last 168 hours. No results for input(s): AMMONIA in the last 168 hours. CBC: Recent Labs  Lab 09/09/18 2108 09/11/18 0420 09/12/18 0434 09/13/18 0342 09/14/18 0531  WBC 9.3 9.4 9.9 8.7 7.1  NEUTROABS 7.7  --   --   --   --   HGB 13.3 11.4*  10.4* 9.0* 9.2*  HCT 43.0 37.8* 35.2* 29.7* 30.5*  MCV 100.0 103.0* 104.1* 102.8* 102.7*  PLT 197 163 136* 131* 156   Cardiac Enzymes: No results for input(s): CKTOTAL, CKMB, CKMBINDEX, TROPONINI in the last 168 hours. BNP: Invalid input(s): POCBNP CBG: Recent Labs  Lab 09/12/18 0345  GLUCAP 126*   D-Dimer No results for input(s): DDIMER in the last 72 hours. Hgb A1c No results for input(s): HGBA1C in the last 72 hours.  Lipid Profile No results for input(s): CHOL, HDL, LDLCALC, TRIG, CHOLHDL, LDLDIRECT in the last 72 hours. Thyroid function studies No results for input(s): TSH, T4TOTAL, T3FREE, THYROIDAB in the last 72 hours.  Invalid input(s): FREET3 Anemia work up No results for input(s): VITAMINB12, FOLATE, FERRITIN, TIBC, IRON, RETICCTPCT in the last 72 hours. Urinalysis No results found for: COLORURINE, APPEARANCEUR, [place], [place], [place], [place], [place], [place], PROTEINUR, UROBILINOGEN, NITRITE, LEUKOCYTESUR Sepsis Labs Invalid input(s): PROCALCITONIN,  WBC,  LACTICIDVEN Microbiology Recent Results (from the past 240 hour(s))  Surgical pcr screen     Status: None   Collection Time: 09/09/18 10:25 PM  Result Value Ref Range Status   MRSA, PCR NEGATIVE NEGATIVE Final   Staphylococcus aureus NEGATIVE NEGATIVE Final    Comment: (NOTE) The Xpert SA Assay (FDA approved for NASAL specimens in patients 42 years of age and older), is one component of a comprehensive surveillance program. It is not intended to diagnose infection nor to guide or monitor treatment. Performed at [facility], [place] [name] [place], [place] [address]      Time coordinating discharge: 33 minutes  SIGNED:   Georgette Shell, MD  [facility] 09/14/2018, 10:15 AM Pager   If 7PM-7AM, please contact night-coverage [email or web address] Password [email or web address]

## 2018-09-14 NOTE — Progress Notes (Signed)
Report called to RN at [facility]. Discharge packet will be given to transport company; [employer].

## 2018-09-15 ENCOUNTER — Telehealth: Payer: Self-pay

## 2018-09-15 NOTE — Telephone Encounter (Signed)
Patient was seen at [facility] for a recent fall. Called patient to schedule a hospital follow up but was unable to reach patient by phone. lmom asking patient to call the office to reschedule appt. Patient had some discharge medication changes. Patient was advised to start taking the following meds: Aspirin, bisacodyl, docusate sodium, methocarbamol, metoprolol tartrate, ondansetron and Oxycodone. Patient was asked to stop the following medication: Carvedilol, furosemide, ibuprofen, klor-con and lisinopril.

## 2018-09-21 DIAGNOSIS — G4733 Obstructive sleep apnea (adult) (pediatric): Secondary | ICD-10-CM | POA: Diagnosis not present

## 2018-09-21 DIAGNOSIS — J449 Chronic obstructive pulmonary disease, unspecified: Secondary | ICD-10-CM | POA: Diagnosis not present

## 2018-09-21 DIAGNOSIS — N183 Chronic kidney disease, stage 3 (moderate): Secondary | ICD-10-CM | POA: Diagnosis not present

## 2018-09-21 DIAGNOSIS — S72009A Fracture of unspecified part of neck of unspecified femur, initial encounter for closed fracture: Secondary | ICD-10-CM | POA: Diagnosis not present

## 2018-09-21 DIAGNOSIS — R55 Syncope and collapse: Secondary | ICD-10-CM | POA: Diagnosis not present

## 2018-09-21 DIAGNOSIS — M545 Low back pain: Secondary | ICD-10-CM | POA: Diagnosis not present

## 2018-09-26 ENCOUNTER — Telehealth: Payer: Self-pay | Admitting: Internal Medicine

## 2018-09-26 ENCOUNTER — Telehealth (INDEPENDENT_AMBULATORY_CARE_PROVIDER_SITE_OTHER): Payer: Self-pay | Admitting: Orthopaedic Surgery

## 2018-09-26 ENCOUNTER — Telehealth: Payer: Self-pay | Admitting: Family Medicine

## 2018-09-26 ENCOUNTER — Ambulatory Visit (INDEPENDENT_AMBULATORY_CARE_PROVIDER_SITE_OTHER): Payer: Medicare Other | Admitting: Orthopaedic Surgery

## 2018-09-26 ENCOUNTER — Encounter (INDEPENDENT_AMBULATORY_CARE_PROVIDER_SITE_OTHER): Payer: Self-pay | Admitting: Orthopaedic Surgery

## 2018-09-26 DIAGNOSIS — Z96641 Presence of right artificial hip joint: Secondary | ICD-10-CM

## 2018-09-26 NOTE — Telephone Encounter (Signed)
Patient called stating he had appointment with Dr. Durward Fortes on 08/29/18 for back pain.  Patient states he was referred for MRI which he had to cancel due to falling and needing hip surgery.  Patient states he had "right total hip surgery with Dr. Ninfa Linden."   Patient states since he already had an order for MRI Dr. Ninfa Linden scheduled it while he was in the hospital on 09/12/18.  Patient states he had appointment with Dr. Ninfa Linden today who removed his staples.  Patient states he is currently at a skilled nursing facility (unable to remember the name) and plans to move to [place] to live with his daughter.   Patient is requesting a return call from Dr. Durward Fortes because he never received the results of his MRI and wants to discuss options before moving.  Please advise.

## 2018-09-26 NOTE — Telephone Encounter (Signed)
Returned call to patient he stated he would like appointment with Dr.Hilty this week.Advised no appointments available with Dr.Hilty.Stated he is moving to [facility] after this week.He would like Dr.Hilty to refer him to a cardiologist there.Message sent to Dr.Hilty.

## 2018-09-26 NOTE — Progress Notes (Signed)
The patient comes in today just over 2 weeks out from a right total hip arthroplasty.  This was performed to treat a displaced femoral neck fracture.  He is convalescing in a nursing facility.  He does let me know that as soon as he is released from the skilled nursing facility that he is moving down to [place] with the daughter.  He said he is already had his apartment cleaned out and furniture shop.  He is doing well from a hip standpoint.  He does report some stiffness.  He does state that he needs a rolling walker with a seat and hand brakes.  On exam I did remove the staples from his right hip incision in place Steri-Strips.  It looks good overall and his leg lengths are equal.  I talked to him in length about the surgery to be performed and what his expectations should be.  Since he is moving to [facility] he should establish follow-up with an orthopedic practice down there but obviously he does not need to be seen anytime soon since now he is in the postoperative period and his wound looks good.  If there is any issues at all he should not hesitate to give [place] a call.  He can stop his aspirin as well since he was not on this before surgery.  All question concerns were answered as best as we could today.

## 2018-09-26 NOTE — Telephone Encounter (Signed)
error 

## 2018-09-26 NOTE — Telephone Encounter (Signed)
New Message:    Pt is going to have to move to [place] to live with his daughter. He would like to see Dr Debara Pickett before he leaves next week please. He also would like for Dr Debara Pickett to recommend a Cardiologist for him in [place] please.

## 2018-09-27 NOTE — Telephone Encounter (Signed)
I could recommend Cornelious Bryant, MD - he is with [facility] in the [facility] area. We trained together in fellowship.  Dr. Debara Pickett

## 2018-09-27 NOTE — Telephone Encounter (Signed)
Returned call to patient Dr.Hilty's recommendation given.

## 2018-09-29 NOTE — Telephone Encounter (Signed)
Please copy mri to cd for him to take to [facility]

## 2018-09-29 NOTE — Telephone Encounter (Signed)
Please see below.

## 2018-09-29 NOTE — Telephone Encounter (Signed)
I called patient, CD at front desk of MRI

## 2018-09-29 NOTE — Telephone Encounter (Signed)
Please call pt with results-thanks

## 2018-10-04 ENCOUNTER — Other Ambulatory Visit: Payer: Self-pay

## 2018-10-04 ENCOUNTER — Ambulatory Visit (INDEPENDENT_AMBULATORY_CARE_PROVIDER_SITE_OTHER): Payer: Medicare Other | Admitting: Family Medicine

## 2018-10-04 ENCOUNTER — Encounter: Payer: Self-pay | Admitting: Family Medicine

## 2018-10-04 VITALS — BP 122/76 | HR 77 | Temp 98.2°F | Wt 224.0 lb

## 2018-10-04 DIAGNOSIS — D649 Anemia, unspecified: Secondary | ICD-10-CM

## 2018-10-04 DIAGNOSIS — E878 Other disorders of electrolyte and fluid balance, not elsewhere classified: Secondary | ICD-10-CM

## 2018-10-04 DIAGNOSIS — Z8781 Personal history of (healed) traumatic fracture: Secondary | ICD-10-CM | POA: Diagnosis not present

## 2018-10-04 DIAGNOSIS — G8929 Other chronic pain: Secondary | ICD-10-CM

## 2018-10-04 DIAGNOSIS — R296 Repeated falls: Secondary | ICD-10-CM | POA: Diagnosis not present

## 2018-10-04 DIAGNOSIS — M545 Low back pain: Secondary | ICD-10-CM | POA: Diagnosis not present

## 2018-10-04 NOTE — Progress Notes (Signed)
   Subjective:    Patient ID: Curtis Riley, male    DOB: 30-Dec-1934, 84 y.o.   MRN: [id number]  HPI He is here for follow-up visit after a fall on February 22 and fracture of the right hip.  He did have a total hip replacement at that time.  He was then transferred to a rehab unit and is today going to be moving in with his daughter and at the end of the week will be moving to [place] to be with his other daughter.  Presently he is not complaining of any hip pain.  He does have a previous history of back pain and has fallen on several occasions.  He has been given a prescription for a walker. Review of the hospital record indicates he did have a slight anemia as well as electrolyte and renal abnormalities.  He will need follow-up on this.  He has had no chest pain, shortness of breath.  His right leg is still slightly swollen. He is here with his daughter.  Review of Systems     Objective:   Physical Exam Alert and in no distress.  Lungs are clear to auscultation.  Cardiac exam shows regular rhythm without murmurs or gallops.  Right lower extremity is slightly edematous but negative Homans sign       Assessment & Plan:  Chronic low back pain without sciatica, unspecified back pain laterality  S/P right hip fracture  Disorder of electrolytes - Plan: Comprehensive metabolic panel  Anemia, unspecified type - Plan: CBC with Differential/Platelet  Frequent falls I discussed getting him involved in PT as well as OT for the next couple days if we can.  Encouraged him to use the walker.  Prescription given for a shower seat for him. Over 25 minutes, greater than 50% spent in counseling and coordination of care.

## 2018-10-05 ENCOUNTER — Other Ambulatory Visit: Payer: Self-pay

## 2018-10-05 DIAGNOSIS — M545 Low back pain: Principal | ICD-10-CM

## 2018-10-05 DIAGNOSIS — G8929 Other chronic pain: Secondary | ICD-10-CM

## 2018-10-06 ENCOUNTER — Telehealth (INDEPENDENT_AMBULATORY_CARE_PROVIDER_SITE_OTHER): Payer: Self-pay | Admitting: Orthopaedic Surgery

## 2018-10-06 NOTE — Telephone Encounter (Signed)
Pt had a rx written for a walker and [facility] is saying that he doesn't have a medicare ID # that they can file with.

## 2018-10-07 DIAGNOSIS — J449 Chronic obstructive pulmonary disease, unspecified: Secondary | ICD-10-CM | POA: Diagnosis not present

## 2018-10-07 DIAGNOSIS — Z9181 History of falling: Secondary | ICD-10-CM | POA: Diagnosis not present

## 2018-10-07 DIAGNOSIS — M545 Low back pain: Secondary | ICD-10-CM | POA: Diagnosis not present

## 2018-10-07 DIAGNOSIS — M4800 Spinal stenosis, site unspecified: Secondary | ICD-10-CM | POA: Diagnosis not present

## 2018-10-07 DIAGNOSIS — Z96641 Presence of right artificial hip joint: Secondary | ICD-10-CM | POA: Diagnosis not present

## 2018-10-07 DIAGNOSIS — N189 Chronic kidney disease, unspecified: Secondary | ICD-10-CM | POA: Diagnosis not present

## 2018-10-07 DIAGNOSIS — G8929 Other chronic pain: Secondary | ICD-10-CM | POA: Diagnosis not present

## 2018-10-07 DIAGNOSIS — I129 Hypertensive chronic kidney disease with stage 1 through stage 4 chronic kidney disease, or unspecified chronic kidney disease: Secondary | ICD-10-CM | POA: Diagnosis not present

## 2018-10-07 DIAGNOSIS — S72044D Nondisplaced fracture of base of neck of right femur, subsequent encounter for closed fracture with routine healing: Secondary | ICD-10-CM | POA: Diagnosis not present

## 2018-10-07 DIAGNOSIS — Z7982 Long term (current) use of aspirin: Secondary | ICD-10-CM | POA: Diagnosis not present

## 2018-10-07 DIAGNOSIS — G4700 Insomnia, unspecified: Secondary | ICD-10-CM | POA: Diagnosis not present

## 2018-10-07 NOTE — Telephone Encounter (Signed)
LMOM for patient letting him know that [facility] is processing his claim and will contact him

## 2018-10-08 DIAGNOSIS — G8929 Other chronic pain: Secondary | ICD-10-CM | POA: Diagnosis not present

## 2018-10-08 DIAGNOSIS — S72044D Nondisplaced fracture of base of neck of right femur, subsequent encounter for closed fracture with routine healing: Secondary | ICD-10-CM | POA: Diagnosis not present

## 2018-10-08 DIAGNOSIS — I129 Hypertensive chronic kidney disease with stage 1 through stage 4 chronic kidney disease, or unspecified chronic kidney disease: Secondary | ICD-10-CM | POA: Diagnosis not present

## 2018-10-08 DIAGNOSIS — J449 Chronic obstructive pulmonary disease, unspecified: Secondary | ICD-10-CM | POA: Diagnosis not present

## 2018-10-08 DIAGNOSIS — M545 Low back pain: Secondary | ICD-10-CM | POA: Diagnosis not present

## 2018-10-08 DIAGNOSIS — N189 Chronic kidney disease, unspecified: Secondary | ICD-10-CM | POA: Diagnosis not present

## 2018-10-09 DIAGNOSIS — N189 Chronic kidney disease, unspecified: Secondary | ICD-10-CM | POA: Diagnosis not present

## 2018-10-09 DIAGNOSIS — J449 Chronic obstructive pulmonary disease, unspecified: Secondary | ICD-10-CM | POA: Diagnosis not present

## 2018-10-09 DIAGNOSIS — M545 Low back pain: Secondary | ICD-10-CM | POA: Diagnosis not present

## 2018-10-09 DIAGNOSIS — G8929 Other chronic pain: Secondary | ICD-10-CM | POA: Diagnosis not present

## 2018-10-09 DIAGNOSIS — I129 Hypertensive chronic kidney disease with stage 1 through stage 4 chronic kidney disease, or unspecified chronic kidney disease: Secondary | ICD-10-CM | POA: Diagnosis not present

## 2018-10-09 DIAGNOSIS — S72044D Nondisplaced fracture of base of neck of right femur, subsequent encounter for closed fracture with routine healing: Secondary | ICD-10-CM | POA: Diagnosis not present

## 2018-10-10 ENCOUNTER — Telehealth: Payer: Self-pay | Admitting: Family Medicine

## 2018-10-10 DIAGNOSIS — N189 Chronic kidney disease, unspecified: Secondary | ICD-10-CM | POA: Diagnosis not present

## 2018-10-10 DIAGNOSIS — G8929 Other chronic pain: Secondary | ICD-10-CM | POA: Diagnosis not present

## 2018-10-10 DIAGNOSIS — J449 Chronic obstructive pulmonary disease, unspecified: Secondary | ICD-10-CM | POA: Diagnosis not present

## 2018-10-10 DIAGNOSIS — M545 Low back pain: Secondary | ICD-10-CM | POA: Diagnosis not present

## 2018-10-10 DIAGNOSIS — S72044D Nondisplaced fracture of base of neck of right femur, subsequent encounter for closed fracture with routine healing: Secondary | ICD-10-CM | POA: Diagnosis not present

## 2018-10-10 DIAGNOSIS — I129 Hypertensive chronic kidney disease with stage 1 through stage 4 chronic kidney disease, or unspecified chronic kidney disease: Secondary | ICD-10-CM | POA: Diagnosis not present

## 2018-10-10 NOTE — Telephone Encounter (Signed)
LVM for [facility]. [place]

## 2018-10-10 NOTE — Telephone Encounter (Signed)
Curtis Riley with [facility] homehealth called. She is requesting skilled nursing and PT. Please advise at [phone].

## 2018-10-10 NOTE — Telephone Encounter (Signed)
ok 

## 2018-10-11 DIAGNOSIS — I129 Hypertensive chronic kidney disease with stage 1 through stage 4 chronic kidney disease, or unspecified chronic kidney disease: Secondary | ICD-10-CM | POA: Diagnosis not present

## 2018-10-11 DIAGNOSIS — J449 Chronic obstructive pulmonary disease, unspecified: Secondary | ICD-10-CM | POA: Diagnosis not present

## 2018-10-11 DIAGNOSIS — M545 Low back pain: Secondary | ICD-10-CM | POA: Diagnosis not present

## 2018-10-11 DIAGNOSIS — G8929 Other chronic pain: Secondary | ICD-10-CM | POA: Diagnosis not present

## 2018-10-11 DIAGNOSIS — S72044D Nondisplaced fracture of base of neck of right femur, subsequent encounter for closed fracture with routine healing: Secondary | ICD-10-CM | POA: Diagnosis not present

## 2018-10-11 DIAGNOSIS — N189 Chronic kidney disease, unspecified: Secondary | ICD-10-CM | POA: Diagnosis not present

## 2018-10-12 DIAGNOSIS — M545 Low back pain: Secondary | ICD-10-CM | POA: Diagnosis not present

## 2018-10-12 DIAGNOSIS — G8929 Other chronic pain: Secondary | ICD-10-CM | POA: Diagnosis not present

## 2018-10-12 DIAGNOSIS — S72044D Nondisplaced fracture of base of neck of right femur, subsequent encounter for closed fracture with routine healing: Secondary | ICD-10-CM | POA: Diagnosis not present

## 2018-10-12 DIAGNOSIS — J449 Chronic obstructive pulmonary disease, unspecified: Secondary | ICD-10-CM | POA: Diagnosis not present

## 2018-10-12 DIAGNOSIS — I129 Hypertensive chronic kidney disease with stage 1 through stage 4 chronic kidney disease, or unspecified chronic kidney disease: Secondary | ICD-10-CM | POA: Diagnosis not present

## 2018-10-12 DIAGNOSIS — N189 Chronic kidney disease, unspecified: Secondary | ICD-10-CM | POA: Diagnosis not present

## 2018-10-13 DIAGNOSIS — I129 Hypertensive chronic kidney disease with stage 1 through stage 4 chronic kidney disease, or unspecified chronic kidney disease: Secondary | ICD-10-CM | POA: Diagnosis not present

## 2018-10-13 DIAGNOSIS — M545 Low back pain: Secondary | ICD-10-CM | POA: Diagnosis not present

## 2018-10-13 DIAGNOSIS — G8929 Other chronic pain: Secondary | ICD-10-CM | POA: Diagnosis not present

## 2018-10-13 DIAGNOSIS — S72044D Nondisplaced fracture of base of neck of right femur, subsequent encounter for closed fracture with routine healing: Secondary | ICD-10-CM | POA: Diagnosis not present

## 2018-10-13 DIAGNOSIS — J449 Chronic obstructive pulmonary disease, unspecified: Secondary | ICD-10-CM | POA: Diagnosis not present

## 2018-10-13 DIAGNOSIS — N189 Chronic kidney disease, unspecified: Secondary | ICD-10-CM | POA: Diagnosis not present

## 2018-10-14 ENCOUNTER — Ambulatory Visit: Payer: Medicare Other | Admitting: Physician Assistant

## 2018-10-14 DIAGNOSIS — G8929 Other chronic pain: Secondary | ICD-10-CM | POA: Diagnosis not present

## 2018-10-14 DIAGNOSIS — I129 Hypertensive chronic kidney disease with stage 1 through stage 4 chronic kidney disease, or unspecified chronic kidney disease: Secondary | ICD-10-CM | POA: Diagnosis not present

## 2018-10-14 DIAGNOSIS — N189 Chronic kidney disease, unspecified: Secondary | ICD-10-CM | POA: Diagnosis not present

## 2018-10-14 DIAGNOSIS — S72044D Nondisplaced fracture of base of neck of right femur, subsequent encounter for closed fracture with routine healing: Secondary | ICD-10-CM | POA: Diagnosis not present

## 2018-10-14 DIAGNOSIS — M545 Low back pain: Secondary | ICD-10-CM | POA: Diagnosis not present

## 2018-10-14 DIAGNOSIS — J449 Chronic obstructive pulmonary disease, unspecified: Secondary | ICD-10-CM | POA: Diagnosis not present

## 2018-10-17 ENCOUNTER — Other Ambulatory Visit: Payer: Self-pay | Admitting: Internal Medicine

## 2018-10-17 MED ORDER — CARVEDILOL 25 MG PO TABS: 25.0000 mg | ORAL_TABLET | Freq: Two times a day (BID) | ORAL | 2 refills | Status: AC

## 2018-10-17 NOTE — Telephone Encounter (Signed)
° ° ° °*  STAT* If patient is at the pharmacy, call can be transferred to refill team.   1. Which medications need to be refilled? (please list name of each medication and dose if known) carvedilol  2. Which pharmacy/location (including street and city if local pharmacy) is medication to be sent to?[facility] [phone] - [place], [address] AT [address]  3. Do they need a 30 day or 90 day supply? [place]

## 2018-10-18 DIAGNOSIS — M545 Low back pain: Secondary | ICD-10-CM | POA: Diagnosis not present

## 2018-10-18 DIAGNOSIS — I129 Hypertensive chronic kidney disease with stage 1 through stage 4 chronic kidney disease, or unspecified chronic kidney disease: Secondary | ICD-10-CM | POA: Diagnosis not present

## 2018-10-18 DIAGNOSIS — S72044D Nondisplaced fracture of base of neck of right femur, subsequent encounter for closed fracture with routine healing: Secondary | ICD-10-CM | POA: Diagnosis not present

## 2018-10-18 DIAGNOSIS — N189 Chronic kidney disease, unspecified: Secondary | ICD-10-CM | POA: Diagnosis not present

## 2018-10-18 DIAGNOSIS — G8929 Other chronic pain: Secondary | ICD-10-CM | POA: Diagnosis not present

## 2018-10-18 DIAGNOSIS — J449 Chronic obstructive pulmonary disease, unspecified: Secondary | ICD-10-CM | POA: Diagnosis not present

## 2018-10-19 DIAGNOSIS — M545 Low back pain: Secondary | ICD-10-CM | POA: Diagnosis not present

## 2018-10-19 DIAGNOSIS — J449 Chronic obstructive pulmonary disease, unspecified: Secondary | ICD-10-CM | POA: Diagnosis not present

## 2018-10-19 DIAGNOSIS — I129 Hypertensive chronic kidney disease with stage 1 through stage 4 chronic kidney disease, or unspecified chronic kidney disease: Secondary | ICD-10-CM | POA: Diagnosis not present

## 2018-10-19 DIAGNOSIS — G8929 Other chronic pain: Secondary | ICD-10-CM | POA: Diagnosis not present

## 2018-10-19 DIAGNOSIS — S72044D Nondisplaced fracture of base of neck of right femur, subsequent encounter for closed fracture with routine healing: Secondary | ICD-10-CM | POA: Diagnosis not present

## 2018-10-19 DIAGNOSIS — N189 Chronic kidney disease, unspecified: Secondary | ICD-10-CM | POA: Diagnosis not present

## 2018-10-20 DIAGNOSIS — I129 Hypertensive chronic kidney disease with stage 1 through stage 4 chronic kidney disease, or unspecified chronic kidney disease: Secondary | ICD-10-CM | POA: Diagnosis not present

## 2018-10-20 DIAGNOSIS — J449 Chronic obstructive pulmonary disease, unspecified: Secondary | ICD-10-CM | POA: Diagnosis not present

## 2018-10-20 DIAGNOSIS — M545 Low back pain: Secondary | ICD-10-CM | POA: Diagnosis not present

## 2018-10-20 DIAGNOSIS — G8929 Other chronic pain: Secondary | ICD-10-CM | POA: Diagnosis not present

## 2018-10-20 DIAGNOSIS — S72044D Nondisplaced fracture of base of neck of right femur, subsequent encounter for closed fracture with routine healing: Secondary | ICD-10-CM | POA: Diagnosis not present

## 2018-10-20 DIAGNOSIS — N189 Chronic kidney disease, unspecified: Secondary | ICD-10-CM | POA: Diagnosis not present

## 2018-10-21 DIAGNOSIS — M545 Low back pain: Secondary | ICD-10-CM | POA: Diagnosis not present

## 2018-10-21 DIAGNOSIS — I129 Hypertensive chronic kidney disease with stage 1 through stage 4 chronic kidney disease, or unspecified chronic kidney disease: Secondary | ICD-10-CM | POA: Diagnosis not present

## 2018-10-21 DIAGNOSIS — J449 Chronic obstructive pulmonary disease, unspecified: Secondary | ICD-10-CM | POA: Diagnosis not present

## 2018-10-21 DIAGNOSIS — N189 Chronic kidney disease, unspecified: Secondary | ICD-10-CM | POA: Diagnosis not present

## 2018-10-21 DIAGNOSIS — G8929 Other chronic pain: Secondary | ICD-10-CM | POA: Diagnosis not present

## 2018-10-21 DIAGNOSIS — S72044D Nondisplaced fracture of base of neck of right femur, subsequent encounter for closed fracture with routine healing: Secondary | ICD-10-CM | POA: Diagnosis not present

## 2018-10-22 ENCOUNTER — Other Ambulatory Visit: Payer: Self-pay | Admitting: Family Medicine

## 2018-10-22 MED ORDER — ALBUTEROL SULFATE 0.63 MG/3ML IN NEBU: 1.0000 | INHALATION_SOLUTION | Freq: Four times a day (QID) | RESPIRATORY_TRACT | 12 refills | Status: AC | PRN

## 2018-10-24 DIAGNOSIS — J449 Chronic obstructive pulmonary disease, unspecified: Secondary | ICD-10-CM | POA: Diagnosis not present

## 2018-10-24 DIAGNOSIS — S72044D Nondisplaced fracture of base of neck of right femur, subsequent encounter for closed fracture with routine healing: Secondary | ICD-10-CM | POA: Diagnosis not present

## 2018-10-24 DIAGNOSIS — G8929 Other chronic pain: Secondary | ICD-10-CM | POA: Diagnosis not present

## 2018-10-24 DIAGNOSIS — I129 Hypertensive chronic kidney disease with stage 1 through stage 4 chronic kidney disease, or unspecified chronic kidney disease: Secondary | ICD-10-CM | POA: Diagnosis not present

## 2018-10-24 DIAGNOSIS — N189 Chronic kidney disease, unspecified: Secondary | ICD-10-CM | POA: Diagnosis not present

## 2018-10-24 DIAGNOSIS — M545 Low back pain: Secondary | ICD-10-CM | POA: Diagnosis not present

## 2018-10-25 DIAGNOSIS — G8929 Other chronic pain: Secondary | ICD-10-CM | POA: Diagnosis not present

## 2018-10-25 DIAGNOSIS — I129 Hypertensive chronic kidney disease with stage 1 through stage 4 chronic kidney disease, or unspecified chronic kidney disease: Secondary | ICD-10-CM | POA: Diagnosis not present

## 2018-10-25 DIAGNOSIS — S72044D Nondisplaced fracture of base of neck of right femur, subsequent encounter for closed fracture with routine healing: Secondary | ICD-10-CM | POA: Diagnosis not present

## 2018-10-25 DIAGNOSIS — N189 Chronic kidney disease, unspecified: Secondary | ICD-10-CM | POA: Diagnosis not present

## 2018-10-25 DIAGNOSIS — M545 Low back pain: Secondary | ICD-10-CM | POA: Diagnosis not present

## 2018-10-25 DIAGNOSIS — J449 Chronic obstructive pulmonary disease, unspecified: Secondary | ICD-10-CM | POA: Diagnosis not present

## 2018-10-26 ENCOUNTER — Telehealth (INDEPENDENT_AMBULATORY_CARE_PROVIDER_SITE_OTHER): Payer: Self-pay

## 2018-10-26 NOTE — Telephone Encounter (Signed)
I called patient and advised. Patient made appointment for 10/31/2018 at 1:15pm. He is unable to come to office tomorrow. Patient expressed understanding of no exercise and protecting hip.

## 2018-10-26 NOTE — Telephone Encounter (Signed)
Pt says he feels like "hip is coming out of the socket." Has had 2 episodes of this. One while at PT and then once today while walking. Is this normal? Should he been seen? Had surgery in Feb.

## 2018-10-26 NOTE — Telephone Encounter (Signed)
Please advise 

## 2018-10-26 NOTE — Telephone Encounter (Signed)
Anytime someone feels some instability of their hip I am concerned.  Right now I want him to stop any type of exercising and be careful with his hip.  We need to see him this coming Monday for repeat exam and repeat x-rays.  Obviously if he needs to be seen tomorrow they will be signed as well.

## 2018-10-27 ENCOUNTER — Ambulatory Visit (INDEPENDENT_AMBULATORY_CARE_PROVIDER_SITE_OTHER): Payer: Medicare Other | Admitting: Orthopaedic Surgery

## 2018-10-27 ENCOUNTER — Encounter (INDEPENDENT_AMBULATORY_CARE_PROVIDER_SITE_OTHER): Payer: Self-pay | Admitting: Orthopaedic Surgery

## 2018-10-27 ENCOUNTER — Other Ambulatory Visit: Payer: Self-pay

## 2018-10-27 ENCOUNTER — Ambulatory Visit (INDEPENDENT_AMBULATORY_CARE_PROVIDER_SITE_OTHER): Payer: Medicare Other

## 2018-10-27 DIAGNOSIS — M25551 Pain in right hip: Secondary | ICD-10-CM

## 2018-10-27 DIAGNOSIS — Z96641 Presence of right artificial hip joint: Secondary | ICD-10-CM

## 2018-10-27 NOTE — Progress Notes (Signed)
The patient is a very pleasant 84 year old gentleman who is 7 weeks tomorrow status post a right total hip arthroplasty that was done after an acute mechanical fall in which she sustained a displaced femoral neck fracture.  He ambulates with a rolling walker.  He had incidents about 2 days ago where he felt a pop in his hip and had a sensation as if it was potentially coming out of place.  He has had no other signs and symptoms of instability of the last 2 days and it only hurts minimally.  His daughter is with him today as well.  On exam he was able to get up on the exam table.  I laid him in a supine position.  His leg lengths are equal.  I can easily put his hip to internal and external rotation and it does not feel unstable.  X-rays of the pelvis and right hip shows a well-seated implant with no complicating features.  I see no acute findings or evidence of a fracture.  I want him to go slow in terms of no more therapy on his hip right now and just work on balance and coordination with weightbearing as tolerated.  I want him to be just careful with doing things like putting his shoes and socks on or bending over.  All question concerns were answered and addressed.  I felt that we could see him back in a month but the family would like [place] to wait at least 3 months given the coronavirus pandemic.  We would not need x-rays at that visit unless there is any issues.  Obviously if there is any problems before then they will need to let us know.

## 2018-10-28 DIAGNOSIS — M545 Low back pain: Secondary | ICD-10-CM | POA: Diagnosis not present

## 2018-10-28 DIAGNOSIS — G8929 Other chronic pain: Secondary | ICD-10-CM | POA: Diagnosis not present

## 2018-10-28 DIAGNOSIS — J449 Chronic obstructive pulmonary disease, unspecified: Secondary | ICD-10-CM | POA: Diagnosis not present

## 2018-10-28 DIAGNOSIS — N189 Chronic kidney disease, unspecified: Secondary | ICD-10-CM | POA: Diagnosis not present

## 2018-10-28 DIAGNOSIS — S72044D Nondisplaced fracture of base of neck of right femur, subsequent encounter for closed fracture with routine healing: Secondary | ICD-10-CM | POA: Diagnosis not present

## 2018-10-28 DIAGNOSIS — I129 Hypertensive chronic kidney disease with stage 1 through stage 4 chronic kidney disease, or unspecified chronic kidney disease: Secondary | ICD-10-CM | POA: Diagnosis not present

## 2018-10-31 ENCOUNTER — Ambulatory Visit (INDEPENDENT_AMBULATORY_CARE_PROVIDER_SITE_OTHER): Payer: Medicare Other | Admitting: Orthopaedic Surgery

## 2018-11-01 DIAGNOSIS — I129 Hypertensive chronic kidney disease with stage 1 through stage 4 chronic kidney disease, or unspecified chronic kidney disease: Secondary | ICD-10-CM | POA: Diagnosis not present

## 2018-11-01 DIAGNOSIS — N189 Chronic kidney disease, unspecified: Secondary | ICD-10-CM | POA: Diagnosis not present

## 2018-11-01 DIAGNOSIS — J449 Chronic obstructive pulmonary disease, unspecified: Secondary | ICD-10-CM | POA: Diagnosis not present

## 2018-11-01 DIAGNOSIS — S72044D Nondisplaced fracture of base of neck of right femur, subsequent encounter for closed fracture with routine healing: Secondary | ICD-10-CM | POA: Diagnosis not present

## 2018-11-01 DIAGNOSIS — G8929 Other chronic pain: Secondary | ICD-10-CM | POA: Diagnosis not present

## 2018-11-01 DIAGNOSIS — M545 Low back pain: Secondary | ICD-10-CM | POA: Diagnosis not present

## 2018-11-02 DIAGNOSIS — N189 Chronic kidney disease, unspecified: Secondary | ICD-10-CM | POA: Diagnosis not present

## 2018-11-02 DIAGNOSIS — J449 Chronic obstructive pulmonary disease, unspecified: Secondary | ICD-10-CM | POA: Diagnosis not present

## 2018-11-02 DIAGNOSIS — G8929 Other chronic pain: Secondary | ICD-10-CM | POA: Diagnosis not present

## 2018-11-02 DIAGNOSIS — S72044D Nondisplaced fracture of base of neck of right femur, subsequent encounter for closed fracture with routine healing: Secondary | ICD-10-CM | POA: Diagnosis not present

## 2018-11-02 DIAGNOSIS — I129 Hypertensive chronic kidney disease with stage 1 through stage 4 chronic kidney disease, or unspecified chronic kidney disease: Secondary | ICD-10-CM | POA: Diagnosis not present

## 2018-11-02 DIAGNOSIS — M545 Low back pain: Secondary | ICD-10-CM | POA: Diagnosis not present

## 2018-11-06 DIAGNOSIS — M4800 Spinal stenosis, site unspecified: Secondary | ICD-10-CM | POA: Diagnosis not present

## 2018-11-06 DIAGNOSIS — Z96641 Presence of right artificial hip joint: Secondary | ICD-10-CM | POA: Diagnosis not present

## 2018-11-06 DIAGNOSIS — G4700 Insomnia, unspecified: Secondary | ICD-10-CM | POA: Diagnosis not present

## 2018-11-06 DIAGNOSIS — G8929 Other chronic pain: Secondary | ICD-10-CM | POA: Diagnosis not present

## 2018-11-06 DIAGNOSIS — I129 Hypertensive chronic kidney disease with stage 1 through stage 4 chronic kidney disease, or unspecified chronic kidney disease: Secondary | ICD-10-CM | POA: Diagnosis not present

## 2018-11-06 DIAGNOSIS — J449 Chronic obstructive pulmonary disease, unspecified: Secondary | ICD-10-CM | POA: Diagnosis not present

## 2018-11-06 DIAGNOSIS — N189 Chronic kidney disease, unspecified: Secondary | ICD-10-CM | POA: Diagnosis not present

## 2018-11-06 DIAGNOSIS — M545 Low back pain: Secondary | ICD-10-CM | POA: Diagnosis not present

## 2018-11-06 DIAGNOSIS — Z9181 History of falling: Secondary | ICD-10-CM | POA: Diagnosis not present

## 2018-11-06 DIAGNOSIS — S72044D Nondisplaced fracture of base of neck of right femur, subsequent encounter for closed fracture with routine healing: Secondary | ICD-10-CM | POA: Diagnosis not present

## 2018-11-06 DIAGNOSIS — Z7982 Long term (current) use of aspirin: Secondary | ICD-10-CM | POA: Diagnosis not present

## 2018-11-07 DIAGNOSIS — S72044D Nondisplaced fracture of base of neck of right femur, subsequent encounter for closed fracture with routine healing: Secondary | ICD-10-CM | POA: Diagnosis not present

## 2018-11-07 DIAGNOSIS — M545 Low back pain: Secondary | ICD-10-CM | POA: Diagnosis not present

## 2018-11-07 DIAGNOSIS — I129 Hypertensive chronic kidney disease with stage 1 through stage 4 chronic kidney disease, or unspecified chronic kidney disease: Secondary | ICD-10-CM | POA: Diagnosis not present

## 2018-11-07 DIAGNOSIS — G8929 Other chronic pain: Secondary | ICD-10-CM | POA: Diagnosis not present

## 2018-11-07 DIAGNOSIS — N189 Chronic kidney disease, unspecified: Secondary | ICD-10-CM | POA: Diagnosis not present

## 2018-11-07 DIAGNOSIS — J449 Chronic obstructive pulmonary disease, unspecified: Secondary | ICD-10-CM | POA: Diagnosis not present

## 2018-11-11 ENCOUNTER — Telehealth: Payer: Self-pay | Admitting: Family Medicine

## 2018-11-11 NOTE — Telephone Encounter (Signed)
Pt called and is requesting a refill on his lopressor pt needs it to go to the [id number] - [place], [place]

## 2018-11-12 MED ORDER — METOPROLOL TARTRATE 25 MG PO TABS: 25.0000 mg | ORAL_TABLET | Freq: Two times a day (BID) | ORAL | 3 refills | Status: AC

## 2018-11-14 DIAGNOSIS — N189 Chronic kidney disease, unspecified: Secondary | ICD-10-CM | POA: Diagnosis not present

## 2018-11-14 DIAGNOSIS — J449 Chronic obstructive pulmonary disease, unspecified: Secondary | ICD-10-CM | POA: Diagnosis not present

## 2018-11-14 DIAGNOSIS — I129 Hypertensive chronic kidney disease with stage 1 through stage 4 chronic kidney disease, or unspecified chronic kidney disease: Secondary | ICD-10-CM | POA: Diagnosis not present

## 2018-11-14 DIAGNOSIS — S72044D Nondisplaced fracture of base of neck of right femur, subsequent encounter for closed fracture with routine healing: Secondary | ICD-10-CM | POA: Diagnosis not present

## 2018-11-14 DIAGNOSIS — M545 Low back pain: Secondary | ICD-10-CM | POA: Diagnosis not present

## 2018-11-14 DIAGNOSIS — G8929 Other chronic pain: Secondary | ICD-10-CM | POA: Diagnosis not present

## 2018-12-03 DIAGNOSIS — I10 Essential (primary) hypertension: Secondary | ICD-10-CM | POA: Diagnosis not present

## 2018-12-03 DIAGNOSIS — J449 Chronic obstructive pulmonary disease, unspecified: Secondary | ICD-10-CM | POA: Diagnosis not present

## 2018-12-03 DIAGNOSIS — M545 Low back pain: Secondary | ICD-10-CM | POA: Diagnosis not present

## 2018-12-03 DIAGNOSIS — G8929 Other chronic pain: Secondary | ICD-10-CM | POA: Diagnosis not present

## 2018-12-03 DIAGNOSIS — R600 Localized edema: Secondary | ICD-10-CM | POA: Diagnosis not present

## 2018-12-03 DIAGNOSIS — Z6830 Body mass index (BMI) 30.0-30.9, adult: Secondary | ICD-10-CM | POA: Diagnosis not present

## 2018-12-03 DIAGNOSIS — G4730 Sleep apnea, unspecified: Secondary | ICD-10-CM | POA: Diagnosis not present

## 2018-12-14 DIAGNOSIS — R0602 Shortness of breath: Secondary | ICD-10-CM | POA: Diagnosis not present

## 2018-12-14 DIAGNOSIS — R9431 Abnormal electrocardiogram [ECG] [EKG]: Secondary | ICD-10-CM | POA: Diagnosis not present

## 2018-12-14 DIAGNOSIS — R079 Chest pain, unspecified: Secondary | ICD-10-CM | POA: Diagnosis not present

## 2018-12-14 DIAGNOSIS — I10 Essential (primary) hypertension: Secondary | ICD-10-CM | POA: Diagnosis not present

## 2018-12-14 DIAGNOSIS — R609 Edema, unspecified: Secondary | ICD-10-CM | POA: Diagnosis not present

## 2018-12-20 DIAGNOSIS — M47817 Spondylosis without myelopathy or radiculopathy, lumbosacral region: Secondary | ICD-10-CM | POA: Diagnosis not present

## 2018-12-20 DIAGNOSIS — M545 Low back pain: Secondary | ICD-10-CM | POA: Diagnosis not present

## 2018-12-20 DIAGNOSIS — M5137 Other intervertebral disc degeneration, lumbosacral region: Secondary | ICD-10-CM | POA: Diagnosis not present

## 2018-12-20 DIAGNOSIS — M5417 Radiculopathy, lumbosacral region: Secondary | ICD-10-CM | POA: Diagnosis not present

## 2018-12-23 DIAGNOSIS — R072 Precordial pain: Secondary | ICD-10-CM | POA: Diagnosis not present

## 2018-12-23 DIAGNOSIS — I10 Essential (primary) hypertension: Secondary | ICD-10-CM | POA: Diagnosis not present

## 2019-01-04 DIAGNOSIS — R609 Edema, unspecified: Secondary | ICD-10-CM | POA: Diagnosis not present

## 2019-01-04 DIAGNOSIS — I10 Essential (primary) hypertension: Secondary | ICD-10-CM | POA: Diagnosis not present

## 2019-01-04 DIAGNOSIS — R0602 Shortness of breath: Secondary | ICD-10-CM | POA: Diagnosis not present

## 2019-01-04 DIAGNOSIS — R079 Chest pain, unspecified: Secondary | ICD-10-CM | POA: Diagnosis not present

## 2019-01-04 DIAGNOSIS — R9431 Abnormal electrocardiogram [ECG] [EKG]: Secondary | ICD-10-CM | POA: Diagnosis not present

## 2019-01-07 DIAGNOSIS — E278 Other specified disorders of adrenal gland: Secondary | ICD-10-CM | POA: Diagnosis present

## 2019-01-07 DIAGNOSIS — G4733 Obstructive sleep apnea (adult) (pediatric): Secondary | ICD-10-CM | POA: Diagnosis not present

## 2019-01-07 DIAGNOSIS — N281 Cyst of kidney, acquired: Secondary | ICD-10-CM | POA: Diagnosis not present

## 2019-01-07 DIAGNOSIS — E279 Disorder of adrenal gland, unspecified: Secondary | ICD-10-CM | POA: Diagnosis not present

## 2019-01-07 DIAGNOSIS — N138 Other obstructive and reflux uropathy: Secondary | ICD-10-CM | POA: Diagnosis present

## 2019-01-07 DIAGNOSIS — N401 Enlarged prostate with lower urinary tract symptoms: Secondary | ICD-10-CM | POA: Diagnosis present

## 2019-01-07 DIAGNOSIS — I10 Essential (primary) hypertension: Secondary | ICD-10-CM | POA: Diagnosis not present

## 2019-01-07 DIAGNOSIS — I509 Heart failure, unspecified: Secondary | ICD-10-CM | POA: Diagnosis not present

## 2019-01-07 DIAGNOSIS — K559 Vascular disorder of intestine, unspecified: Secondary | ICD-10-CM | POA: Diagnosis not present

## 2019-01-07 DIAGNOSIS — I110 Hypertensive heart disease with heart failure: Secondary | ICD-10-CM | POA: Diagnosis present

## 2019-01-07 DIAGNOSIS — D539 Nutritional anemia, unspecified: Secondary | ICD-10-CM | POA: Diagnosis not present

## 2019-01-07 DIAGNOSIS — N320 Bladder-neck obstruction: Secondary | ICD-10-CM | POA: Diagnosis not present

## 2019-01-07 DIAGNOSIS — D638 Anemia in other chronic diseases classified elsewhere: Secondary | ICD-10-CM | POA: Diagnosis present

## 2019-01-07 DIAGNOSIS — Z87891 Personal history of nicotine dependence: Secondary | ICD-10-CM | POA: Diagnosis not present

## 2019-01-07 DIAGNOSIS — N400 Enlarged prostate without lower urinary tract symptoms: Secondary | ICD-10-CM | POA: Diagnosis not present

## 2019-01-07 DIAGNOSIS — K922 Gastrointestinal hemorrhage, unspecified: Secondary | ICD-10-CM | POA: Diagnosis not present

## 2019-01-07 DIAGNOSIS — E8809 Other disorders of plasma-protein metabolism, not elsewhere classified: Secondary | ICD-10-CM | POA: Diagnosis present

## 2019-01-07 DIAGNOSIS — K625 Hemorrhage of anus and rectum: Secondary | ICD-10-CM | POA: Diagnosis not present

## 2019-01-07 DIAGNOSIS — K5792 Diverticulitis of intestine, part unspecified, without perforation or abscess without bleeding: Secondary | ICD-10-CM | POA: Diagnosis not present

## 2019-01-07 DIAGNOSIS — N2889 Other specified disorders of kidney and ureter: Secondary | ICD-10-CM | POA: Diagnosis not present

## 2019-01-07 DIAGNOSIS — Z79899 Other long term (current) drug therapy: Secondary | ICD-10-CM | POA: Diagnosis not present

## 2019-01-07 DIAGNOSIS — R42 Dizziness and giddiness: Secondary | ICD-10-CM | POA: Diagnosis not present

## 2019-01-07 DIAGNOSIS — J449 Chronic obstructive pulmonary disease, unspecified: Secondary | ICD-10-CM | POA: Diagnosis not present

## 2019-01-07 DIAGNOSIS — K5733 Diverticulitis of large intestine without perforation or abscess with bleeding: Secondary | ICD-10-CM | POA: Diagnosis present

## 2019-01-07 DIAGNOSIS — K921 Melena: Secondary | ICD-10-CM | POA: Diagnosis not present

## 2019-01-07 DIAGNOSIS — R195 Other fecal abnormalities: Secondary | ICD-10-CM | POA: Diagnosis not present

## 2019-01-16 DIAGNOSIS — K5792 Diverticulitis of intestine, part unspecified, without perforation or abscess without bleeding: Secondary | ICD-10-CM | POA: Diagnosis not present

## 2019-01-16 DIAGNOSIS — Z86010 Personal history of colonic polyps: Secondary | ICD-10-CM | POA: Diagnosis not present

## 2019-01-16 DIAGNOSIS — J439 Emphysema, unspecified: Secondary | ICD-10-CM | POA: Diagnosis not present

## 2019-01-16 DIAGNOSIS — K921 Melena: Secondary | ICD-10-CM | POA: Diagnosis not present

## 2019-01-25 DIAGNOSIS — M5417 Radiculopathy, lumbosacral region: Secondary | ICD-10-CM | POA: Diagnosis not present

## 2019-01-26 ENCOUNTER — Ambulatory Visit: Payer: Self-pay | Admitting: Orthopaedic Surgery

## 2019-02-09 DIAGNOSIS — Z01818 Encounter for other preprocedural examination: Secondary | ICD-10-CM | POA: Diagnosis not present

## 2019-02-09 DIAGNOSIS — Z1159 Encounter for screening for other viral diseases: Secondary | ICD-10-CM | POA: Diagnosis not present

## 2019-02-14 DIAGNOSIS — Z9889 Other specified postprocedural states: Secondary | ICD-10-CM | POA: Diagnosis not present

## 2019-02-14 DIAGNOSIS — D123 Benign neoplasm of transverse colon: Secondary | ICD-10-CM | POA: Diagnosis not present

## 2019-02-14 DIAGNOSIS — R5383 Other fatigue: Secondary | ICD-10-CM | POA: Diagnosis not present

## 2019-02-14 DIAGNOSIS — R112 Nausea with vomiting, unspecified: Secondary | ICD-10-CM | POA: Diagnosis not present

## 2019-02-14 DIAGNOSIS — K6389 Other specified diseases of intestine: Secondary | ICD-10-CM | POA: Diagnosis not present

## 2019-02-14 DIAGNOSIS — Z86010 Personal history of colonic polyps: Secondary | ICD-10-CM | POA: Diagnosis not present

## 2019-02-14 DIAGNOSIS — Z7951 Long term (current) use of inhaled steroids: Secondary | ICD-10-CM | POA: Diagnosis not present

## 2019-02-14 DIAGNOSIS — G4733 Obstructive sleep apnea (adult) (pediatric): Secondary | ICD-10-CM | POA: Diagnosis not present

## 2019-02-14 DIAGNOSIS — K921 Melena: Secondary | ICD-10-CM | POA: Diagnosis not present

## 2019-02-14 DIAGNOSIS — J449 Chronic obstructive pulmonary disease, unspecified: Secondary | ICD-10-CM | POA: Diagnosis not present

## 2019-02-14 DIAGNOSIS — I10 Essential (primary) hypertension: Secondary | ICD-10-CM | POA: Diagnosis not present

## 2019-02-14 DIAGNOSIS — K648 Other hemorrhoids: Secondary | ICD-10-CM | POA: Diagnosis not present

## 2019-02-14 DIAGNOSIS — R1110 Vomiting, unspecified: Secondary | ICD-10-CM | POA: Diagnosis not present

## 2019-02-14 DIAGNOSIS — Z7982 Long term (current) use of aspirin: Secondary | ICD-10-CM | POA: Diagnosis not present

## 2019-02-14 DIAGNOSIS — K5732 Diverticulitis of large intestine without perforation or abscess without bleeding: Secondary | ICD-10-CM | POA: Diagnosis not present

## 2019-02-14 DIAGNOSIS — M1991 Primary osteoarthritis, unspecified site: Secondary | ICD-10-CM | POA: Diagnosis not present

## 2019-02-14 DIAGNOSIS — Z880 Allergy status to penicillin: Secondary | ICD-10-CM | POA: Diagnosis not present

## 2019-02-14 DIAGNOSIS — M6281 Muscle weakness (generalized): Secondary | ICD-10-CM | POA: Diagnosis not present

## 2019-02-14 DIAGNOSIS — Z79899 Other long term (current) drug therapy: Secondary | ICD-10-CM | POA: Diagnosis not present

## 2019-02-14 DIAGNOSIS — K5730 Diverticulosis of large intestine without perforation or abscess without bleeding: Secondary | ICD-10-CM | POA: Diagnosis not present

## 2019-02-14 DIAGNOSIS — Z8673 Personal history of transient ischemic attack (TIA), and cerebral infarction without residual deficits: Secondary | ICD-10-CM | POA: Diagnosis not present

## 2019-02-14 DIAGNOSIS — R509 Fever, unspecified: Secondary | ICD-10-CM | POA: Diagnosis not present

## 2019-02-14 DIAGNOSIS — Z87891 Personal history of nicotine dependence: Secondary | ICD-10-CM | POA: Diagnosis not present

## 2019-02-16 DIAGNOSIS — I872 Venous insufficiency (chronic) (peripheral): Secondary | ICD-10-CM | POA: Diagnosis not present

## 2019-02-16 DIAGNOSIS — R079 Chest pain, unspecified: Secondary | ICD-10-CM | POA: Diagnosis not present

## 2019-02-16 DIAGNOSIS — R0602 Shortness of breath: Secondary | ICD-10-CM | POA: Diagnosis not present

## 2019-02-16 DIAGNOSIS — R9431 Abnormal electrocardiogram [ECG] [EKG]: Secondary | ICD-10-CM | POA: Diagnosis not present

## 2019-02-16 DIAGNOSIS — R600 Localized edema: Secondary | ICD-10-CM | POA: Diagnosis not present

## 2019-02-16 DIAGNOSIS — I10 Essential (primary) hypertension: Secondary | ICD-10-CM | POA: Diagnosis not present

## 2019-02-16 DIAGNOSIS — R609 Edema, unspecified: Secondary | ICD-10-CM | POA: Diagnosis not present

## 2019-02-17 DIAGNOSIS — I10 Essential (primary) hypertension: Secondary | ICD-10-CM | POA: Diagnosis not present

## 2019-03-31 DIAGNOSIS — I10 Essential (primary) hypertension: Secondary | ICD-10-CM | POA: Diagnosis not present

## 2019-04-08 DIAGNOSIS — J449 Chronic obstructive pulmonary disease, unspecified: Secondary | ICD-10-CM | POA: Diagnosis not present

## 2019-04-08 DIAGNOSIS — G4730 Sleep apnea, unspecified: Secondary | ICD-10-CM | POA: Diagnosis not present

## 2019-04-08 DIAGNOSIS — D649 Anemia, unspecified: Secondary | ICD-10-CM | POA: Diagnosis not present

## 2019-04-08 DIAGNOSIS — I872 Venous insufficiency (chronic) (peripheral): Secondary | ICD-10-CM | POA: Diagnosis not present

## 2019-04-08 DIAGNOSIS — I10 Essential (primary) hypertension: Secondary | ICD-10-CM | POA: Diagnosis not present

## 2019-04-08 DIAGNOSIS — Z23 Encounter for immunization: Secondary | ICD-10-CM | POA: Diagnosis not present

## 2019-04-08 DIAGNOSIS — Z6827 Body mass index (BMI) 27.0-27.9, adult: Secondary | ICD-10-CM | POA: Diagnosis not present

## 2019-06-14 ENCOUNTER — Other Ambulatory Visit: Payer: Self-pay

## 2019-06-28 IMAGING — CT CT CHEST W/O CM
2 of 3 series · 15 of 36 positions shown, 18 images · non-contrast
Comparison: 10/21/2016

CLINICAL DATA: Followup pulmonary nodules. History of emphysema
with worsening shortness of breath.

EXAM:
CT CHEST WITHOUT CONTRAST
TECHNIQUE: Multidetector CT imaging of the chest was performed following the
standard protocol without IV contrast.

[Series 2: thorax · axial · 0.87mm/px · z∈[-347,-33]mm · 12 of 185 slices shown, 15 images]
[im 14/185  mediastinal]
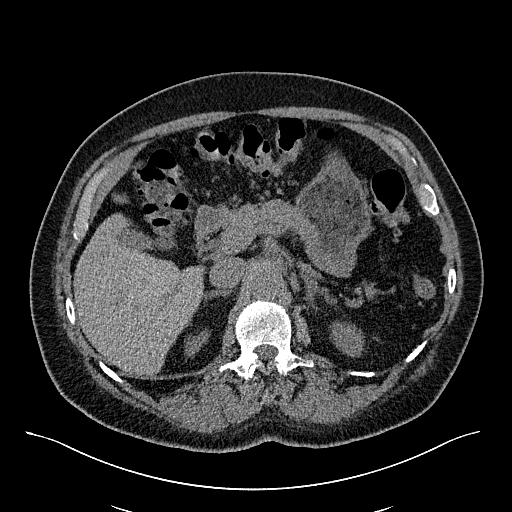
[im 14/185  lung]
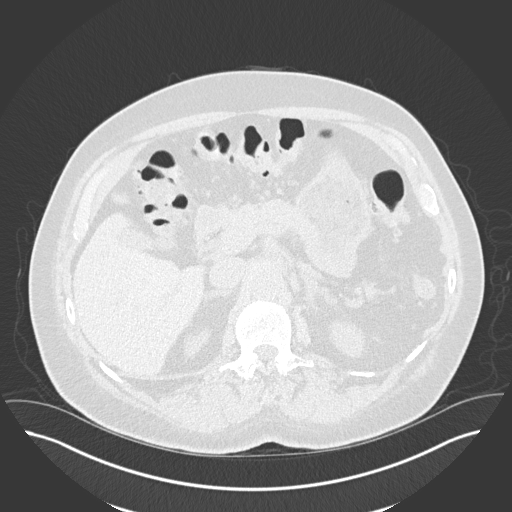
[im 28/185  lung]
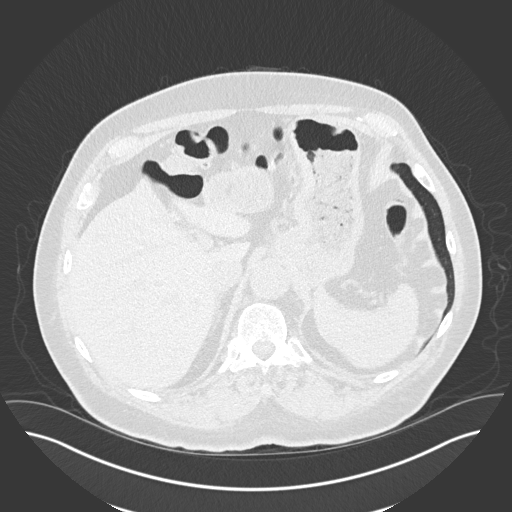
[im 41/185  lung]
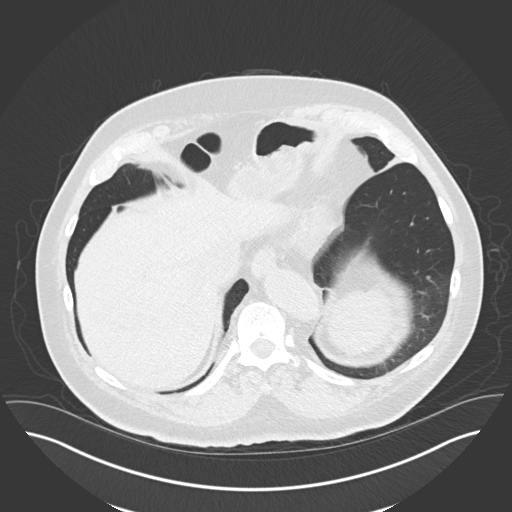
[im 55/185  lung]
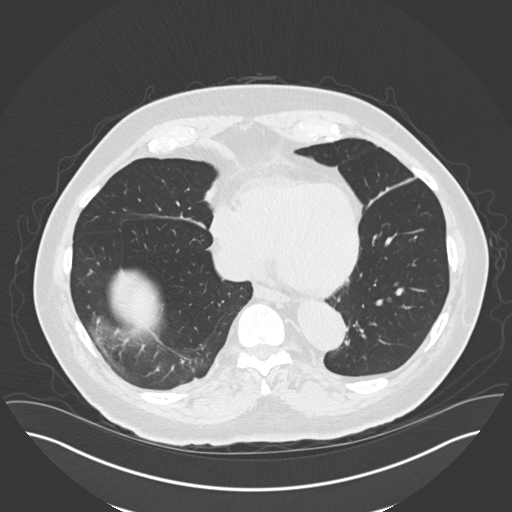
[im 69/185  mediastinal]
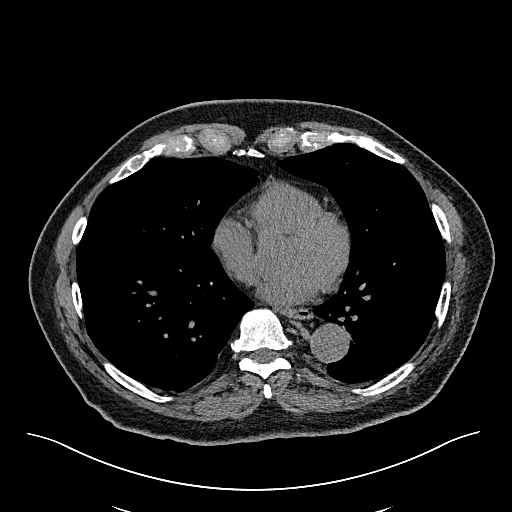
[im 69/185  lung]
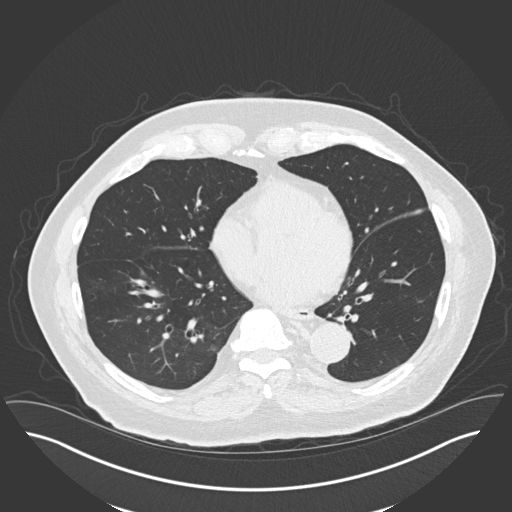
[im 82/185  lung]
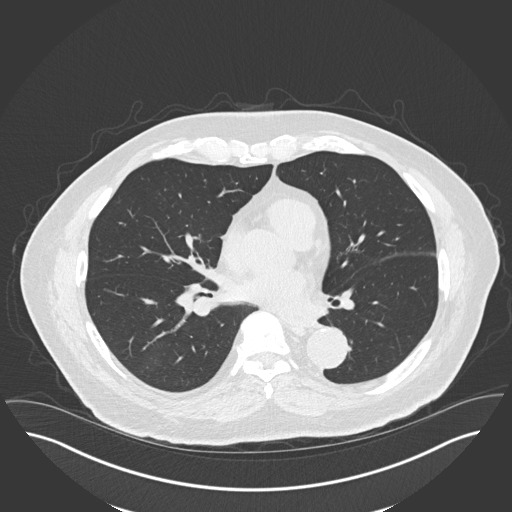
[im 103/185  lung]
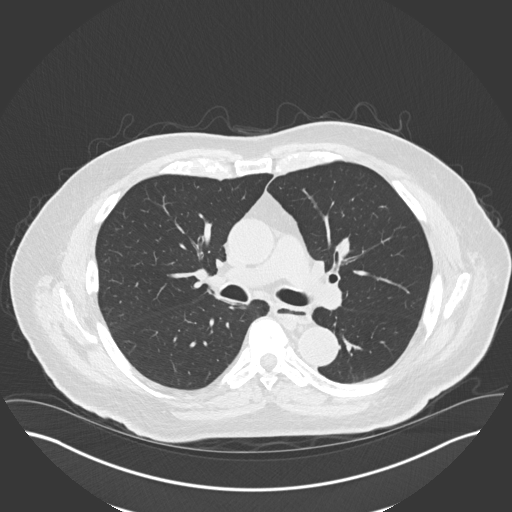
[im 116/185  lung]
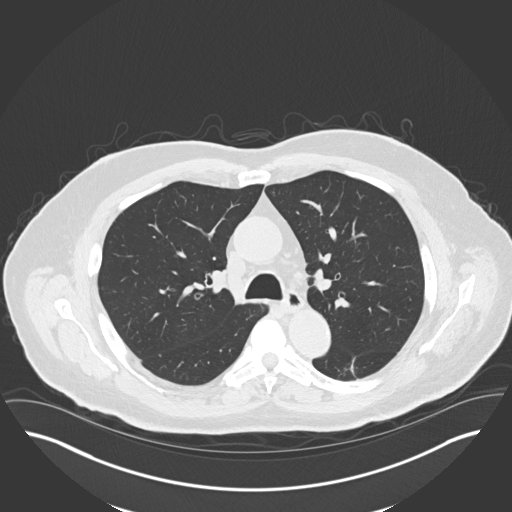
[im 130/185  mediastinal]
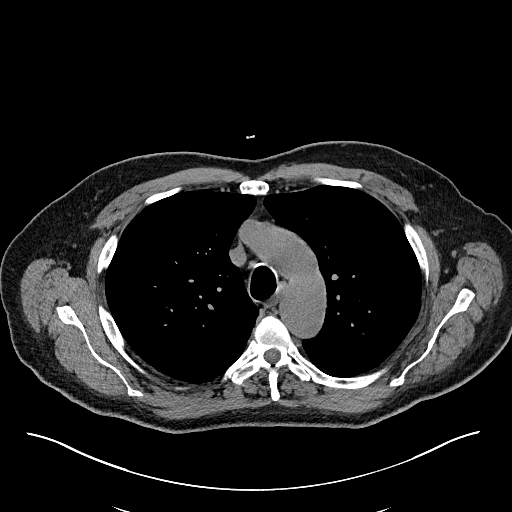
[im 130/185  lung]
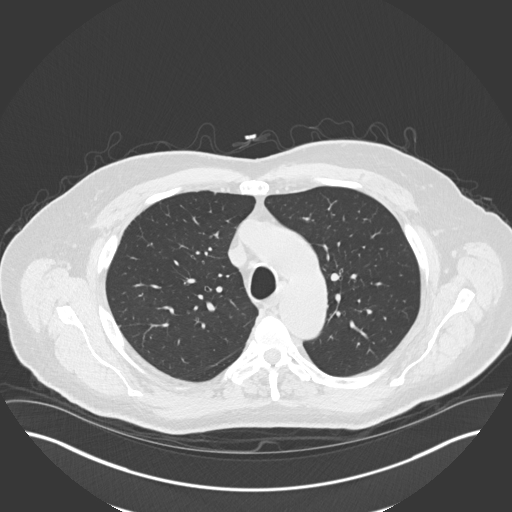
[im 144/185  lung]
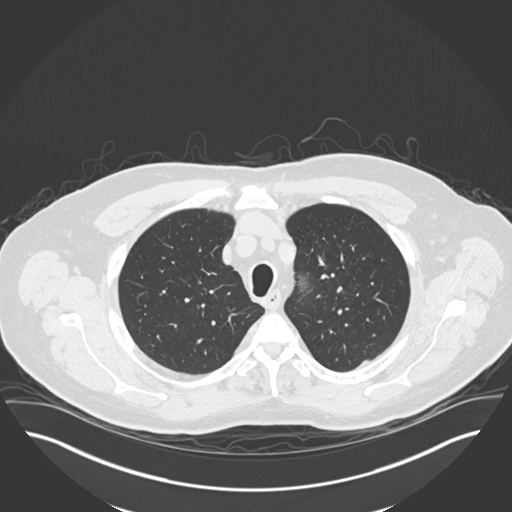
[im 157/185  lung]
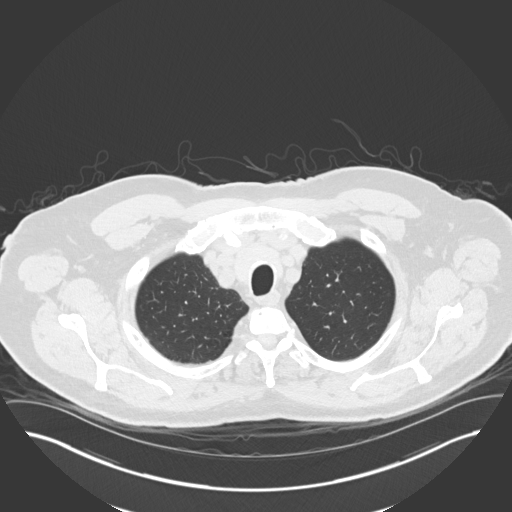
[im 171/185  lung]
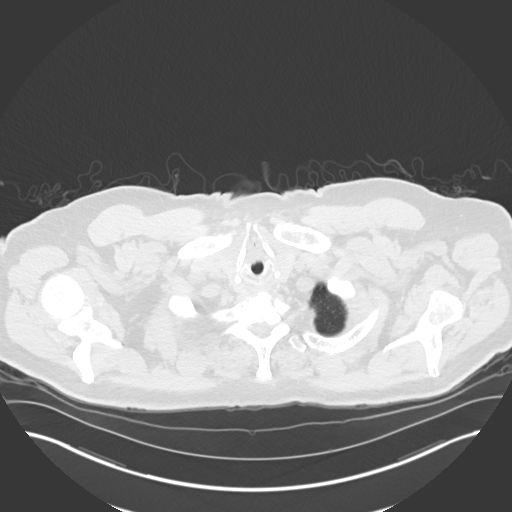

[Series 5: coronal · coronal · 0.72mm/px · 3 of 135 slices shown]
[im 27/135  lung]
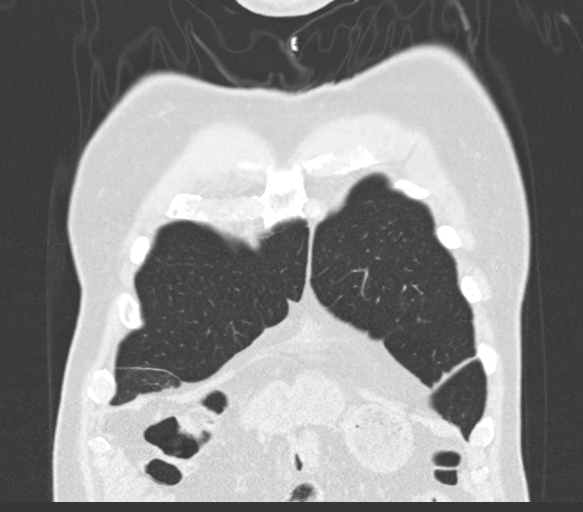
[im 54/135  lung]
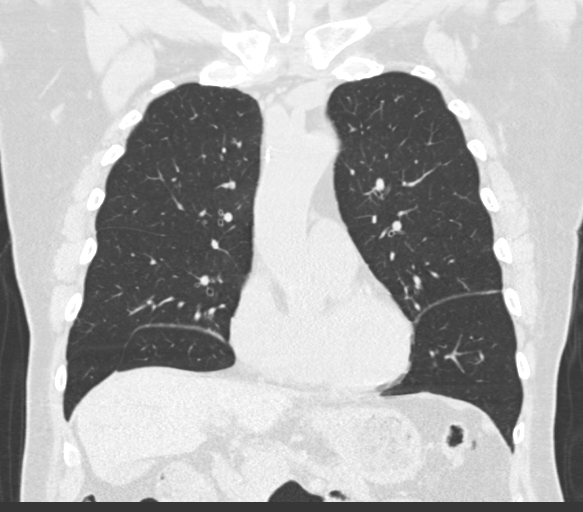
[im 81/135  lung]
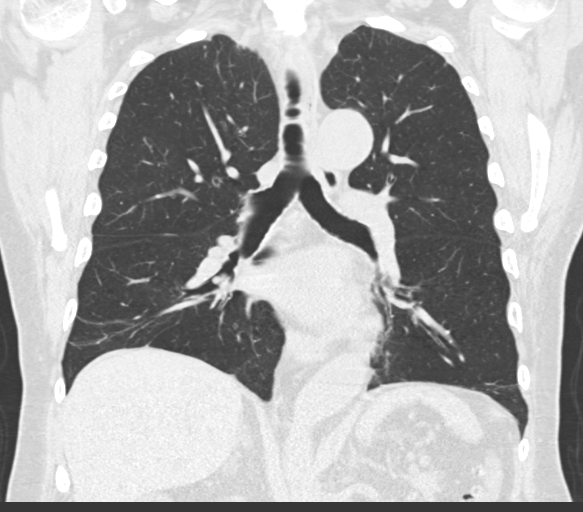

[15 of 36 positions shown; findings below may reference images not displayed]

FINDINGS: Cardiovascular: The heart is normal in size. No pericardial
effusion. Stable tortuosity, ectasia and calcification of the
thoracic aorta. Stable coronary artery calcifications.

Mediastinum/Nodes: No mediastinal or hilar mass or lymphadenopathy.
The esophagus is grossly normal.

Lungs/Pleura: Stable mild emphysematous changes. Stable small sub 4
mm scattered pulmonary nodules. No new or progressive findings. No
acute pulmonary findings. No pleural effusion. Stable right basilar
scarring changes.

Upper Abdomen: No significant upper abdominal findings. There is a
large duodenum diverticulum projecting off the third portion. Stable
hepatic cysts. No adrenal gland lesions.

Musculoskeletal: The bony thorax is intact. No worrisome bone
lesions or fracture.
IMPRESSION: 1. Stable scattered sub 4 mm pulmonary nodules, likely benign.
2. No worrisome pulmonary lesions or acute pulmonary findings.
3. No mediastinal or hilar mass or lymphadenopathy.
4. Stable mild emphysematous changes.

Aortic Atherosclerosis ([id number].L) and Emphysema ([id number].K).

## 2019-06-29 DIAGNOSIS — M5417 Radiculopathy, lumbosacral region: Secondary | ICD-10-CM | POA: Diagnosis not present

## 2019-07-27 ENCOUNTER — Other Ambulatory Visit: Payer: Self-pay | Admitting: Internal Medicine

## 2019-08-11 DIAGNOSIS — I872 Venous insufficiency (chronic) (peripheral): Secondary | ICD-10-CM | POA: Diagnosis not present

## 2019-08-11 DIAGNOSIS — J449 Chronic obstructive pulmonary disease, unspecified: Secondary | ICD-10-CM | POA: Diagnosis not present

## 2019-08-11 DIAGNOSIS — I10 Essential (primary) hypertension: Secondary | ICD-10-CM | POA: Diagnosis not present

## 2019-08-11 DIAGNOSIS — N183 Chronic kidney disease, stage 3 unspecified: Secondary | ICD-10-CM | POA: Diagnosis not present

## 2019-08-11 DIAGNOSIS — Z6829 Body mass index (BMI) 29.0-29.9, adult: Secondary | ICD-10-CM | POA: Diagnosis not present

## 2019-08-17 IMAGING — CR DG LUMBAR SPINE COMPLETE 4+V
5 series · 5 of 5 positions shown · non-contrast
Comparison: None.

CLINICAL DATA: Chronic low back pain, worsening over the last
month. Pain radiating to both legs.

EXAM:
LUMBAR SPINE - COMPLETE 4+ VIEW

[w lumbar spine ap]
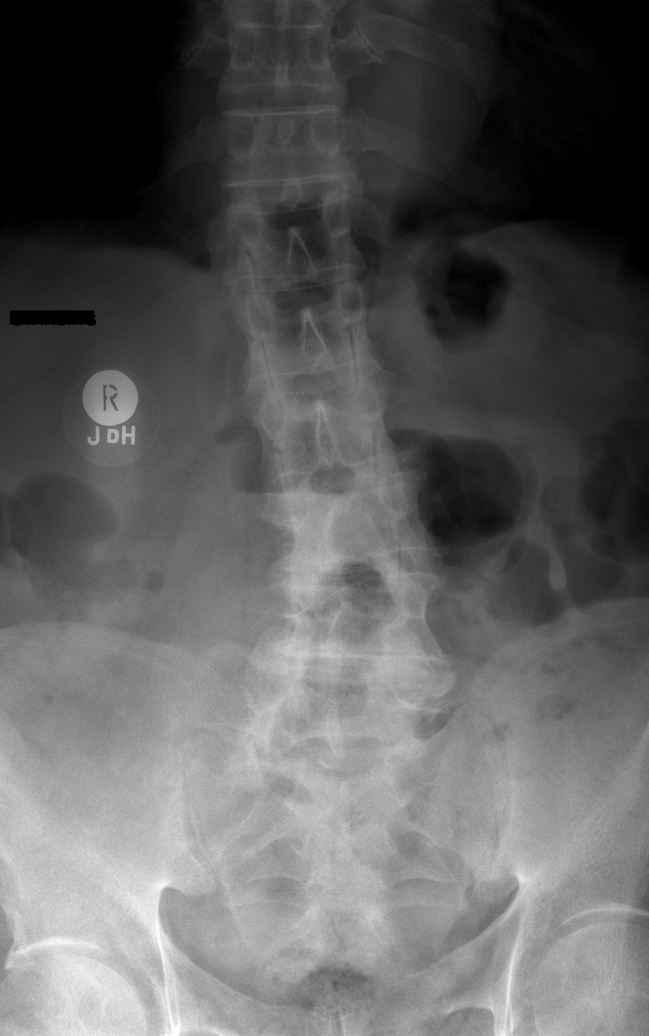

[w lumbar spine obl (1 of 2)]
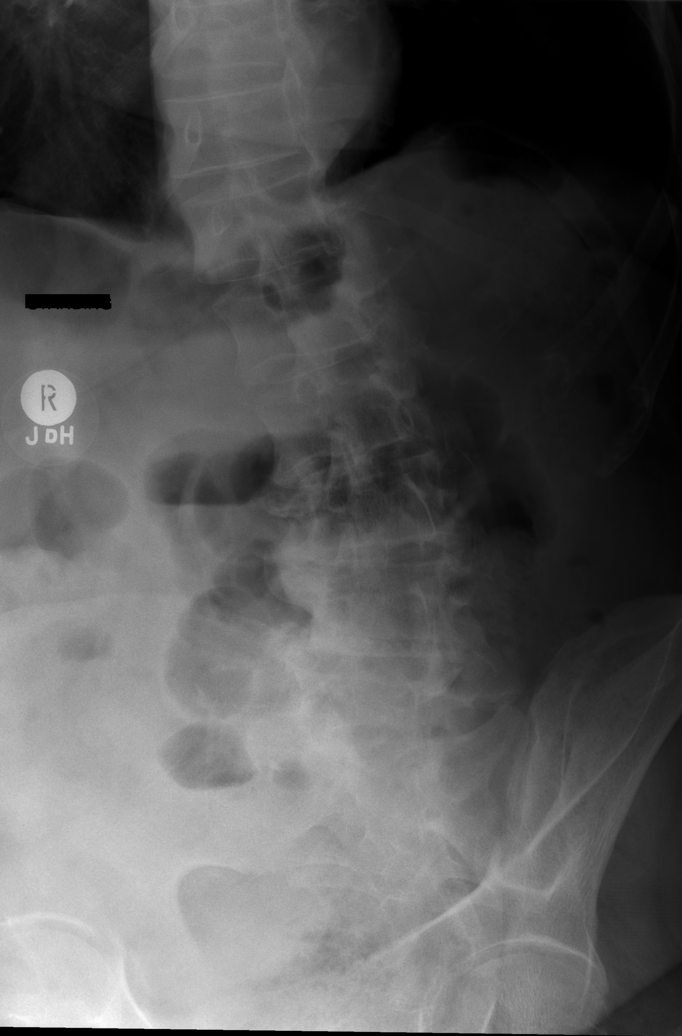

[w lumbar spine obl (2 of 2)]
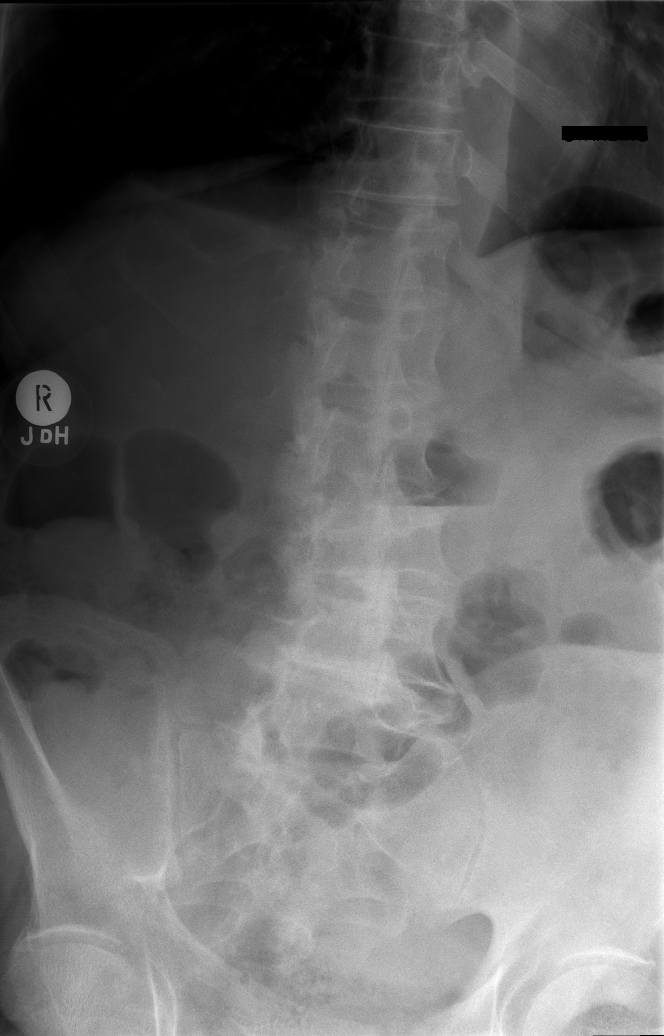

[w lumbar spine lat]
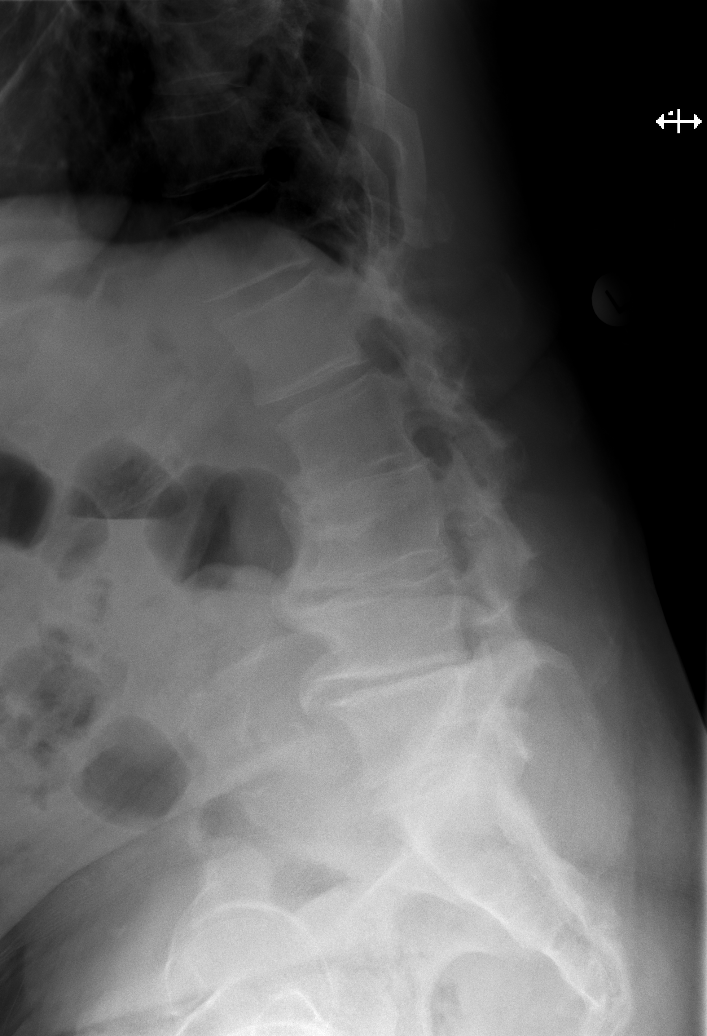

[w lumbar l-5 s-1 spot]
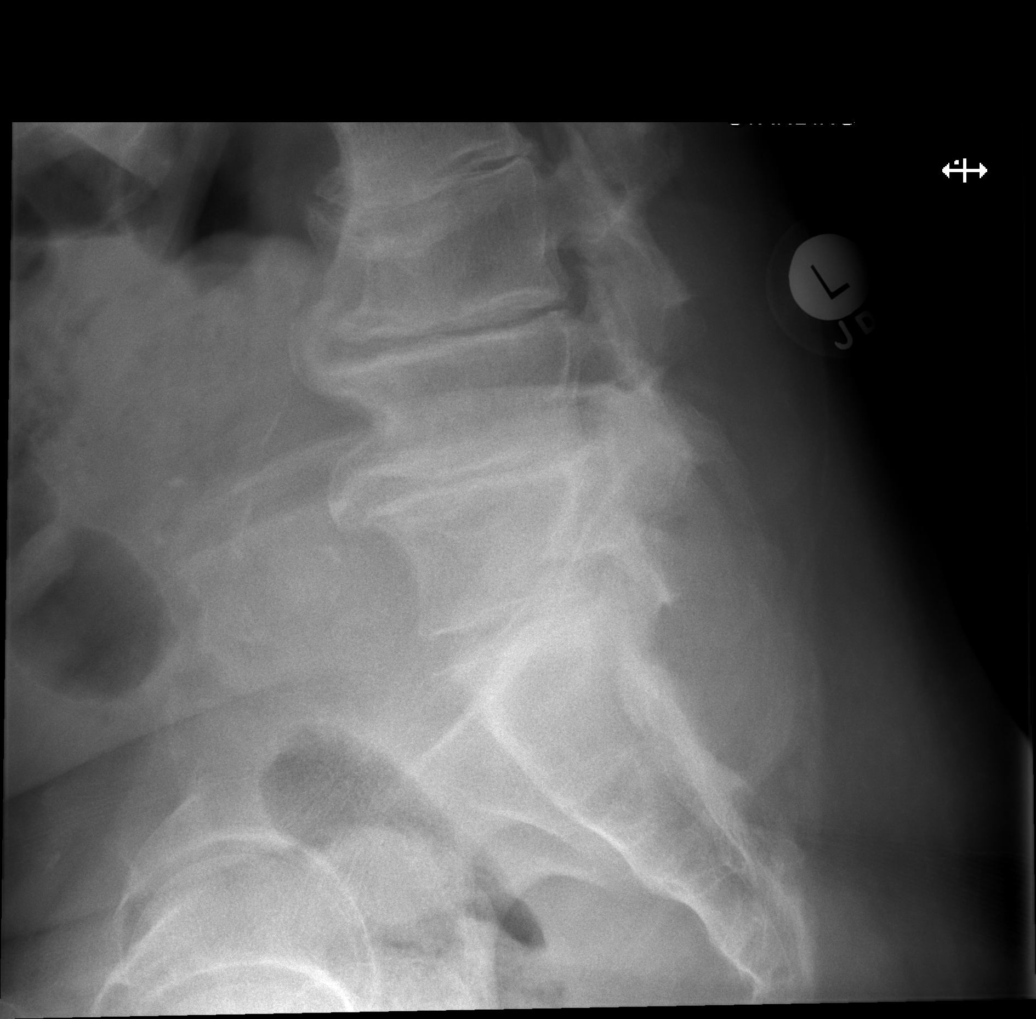

[5 of 5 positions shown; findings below may reference images not displayed]

FINDINGS: A mild apex LEFT LOWER lumbar scoliosis is noted, centered at L4-5.

No acute fracture or subluxation identified.

Moderate to severe degenerative disc disease from L3-S1 noted.

Mild to moderate facet arthropathy within the LOWER lumbar spine
also identified.

No focal bony lesions or definite spondylolysis identified.
IMPRESSION: 1. No acute abnormality
2. Moderate to severe degenerative disc disease from L3-S1 with mild
to moderate facet arthropathy in the LOWER lumbar spine.

## 2019-08-19 DIAGNOSIS — R778 Other specified abnormalities of plasma proteins: Secondary | ICD-10-CM | POA: Diagnosis not present

## 2019-08-19 DIAGNOSIS — D649 Anemia, unspecified: Secondary | ICD-10-CM | POA: Diagnosis not present

## 2019-08-19 DIAGNOSIS — I10 Essential (primary) hypertension: Secondary | ICD-10-CM | POA: Diagnosis not present

## 2019-08-19 DIAGNOSIS — J449 Chronic obstructive pulmonary disease, unspecified: Secondary | ICD-10-CM | POA: Diagnosis not present

## 2019-08-19 DIAGNOSIS — N183 Chronic kidney disease, stage 3 unspecified: Secondary | ICD-10-CM | POA: Diagnosis not present

## 2019-08-26 DIAGNOSIS — I10 Essential (primary) hypertension: Secondary | ICD-10-CM | POA: Diagnosis not present

## 2019-08-26 DIAGNOSIS — J449 Chronic obstructive pulmonary disease, unspecified: Secondary | ICD-10-CM | POA: Diagnosis not present

## 2019-08-31 ENCOUNTER — Other Ambulatory Visit: Payer: Self-pay | Admitting: Family Medicine

## 2019-09-01 NOTE — Telephone Encounter (Signed)
Pt no longer lives here and has new primary care in [place]

## 2019-09-08 DIAGNOSIS — Z8719 Personal history of other diseases of the digestive system: Secondary | ICD-10-CM | POA: Diagnosis not present

## 2019-09-08 DIAGNOSIS — G4730 Sleep apnea, unspecified: Secondary | ICD-10-CM | POA: Diagnosis not present

## 2019-09-08 DIAGNOSIS — Z96649 Presence of unspecified artificial hip joint: Secondary | ICD-10-CM | POA: Diagnosis not present

## 2019-09-08 DIAGNOSIS — Z86010 Personal history of colonic polyps: Secondary | ICD-10-CM | POA: Diagnosis not present

## 2019-09-08 DIAGNOSIS — D649 Anemia, unspecified: Secondary | ICD-10-CM | POA: Diagnosis not present

## 2019-09-08 DIAGNOSIS — J449 Chronic obstructive pulmonary disease, unspecified: Secondary | ICD-10-CM | POA: Diagnosis not present

## 2019-09-08 DIAGNOSIS — K7689 Other specified diseases of liver: Secondary | ICD-10-CM | POA: Diagnosis not present

## 2019-09-15 DIAGNOSIS — K7689 Other specified diseases of liver: Secondary | ICD-10-CM | POA: Diagnosis not present

## 2019-09-15 DIAGNOSIS — D472 Monoclonal gammopathy: Secondary | ICD-10-CM | POA: Diagnosis not present

## 2019-09-15 DIAGNOSIS — G4730 Sleep apnea, unspecified: Secondary | ICD-10-CM | POA: Diagnosis not present

## 2019-09-15 DIAGNOSIS — D508 Other iron deficiency anemias: Secondary | ICD-10-CM | POA: Diagnosis not present

## 2019-09-15 DIAGNOSIS — D649 Anemia, unspecified: Secondary | ICD-10-CM | POA: Diagnosis not present

## 2019-09-15 DIAGNOSIS — R6889 Other general symptoms and signs: Secondary | ICD-10-CM | POA: Diagnosis not present

## 2019-09-15 DIAGNOSIS — J449 Chronic obstructive pulmonary disease, unspecified: Secondary | ICD-10-CM | POA: Diagnosis not present

## 2019-09-28 DIAGNOSIS — J449 Chronic obstructive pulmonary disease, unspecified: Secondary | ICD-10-CM | POA: Diagnosis not present

## 2019-09-28 DIAGNOSIS — I10 Essential (primary) hypertension: Secondary | ICD-10-CM | POA: Diagnosis not present

## 2019-09-29 DIAGNOSIS — Z9989 Dependence on other enabling machines and devices: Secondary | ICD-10-CM | POA: Diagnosis not present

## 2019-09-29 DIAGNOSIS — R0602 Shortness of breath: Secondary | ICD-10-CM | POA: Diagnosis not present

## 2019-09-29 DIAGNOSIS — G4733 Obstructive sleep apnea (adult) (pediatric): Secondary | ICD-10-CM | POA: Diagnosis not present

## 2019-09-29 DIAGNOSIS — J449 Chronic obstructive pulmonary disease, unspecified: Secondary | ICD-10-CM | POA: Diagnosis not present

## 2019-09-29 DIAGNOSIS — Z87891 Personal history of nicotine dependence: Secondary | ICD-10-CM | POA: Diagnosis not present

## 2019-10-10 DIAGNOSIS — G4733 Obstructive sleep apnea (adult) (pediatric): Secondary | ICD-10-CM | POA: Diagnosis not present

## 2019-10-10 DIAGNOSIS — J449 Chronic obstructive pulmonary disease, unspecified: Secondary | ICD-10-CM | POA: Diagnosis not present

## 2019-10-10 DIAGNOSIS — I10 Essential (primary) hypertension: Secondary | ICD-10-CM | POA: Diagnosis not present

## 2019-10-30 DIAGNOSIS — J449 Chronic obstructive pulmonary disease, unspecified: Secondary | ICD-10-CM | POA: Diagnosis not present

## 2019-10-30 DIAGNOSIS — I10 Essential (primary) hypertension: Secondary | ICD-10-CM | POA: Diagnosis not present

## 2019-11-02 DIAGNOSIS — G4733 Obstructive sleep apnea (adult) (pediatric): Secondary | ICD-10-CM | POA: Diagnosis not present

## 2019-11-09 DIAGNOSIS — D649 Anemia, unspecified: Secondary | ICD-10-CM | POA: Diagnosis not present

## 2019-11-09 DIAGNOSIS — D472 Monoclonal gammopathy: Secondary | ICD-10-CM | POA: Diagnosis not present

## 2019-11-09 DIAGNOSIS — D508 Other iron deficiency anemias: Secondary | ICD-10-CM | POA: Diagnosis not present

## 2019-11-10 DIAGNOSIS — D472 Monoclonal gammopathy: Secondary | ICD-10-CM | POA: Diagnosis not present

## 2019-11-10 DIAGNOSIS — D508 Other iron deficiency anemias: Secondary | ICD-10-CM | POA: Diagnosis not present

## 2019-11-10 DIAGNOSIS — D649 Anemia, unspecified: Secondary | ICD-10-CM | POA: Diagnosis not present

## 2019-11-13 DIAGNOSIS — I10 Essential (primary) hypertension: Secondary | ICD-10-CM | POA: Diagnosis not present

## 2019-11-13 DIAGNOSIS — J9611 Chronic respiratory failure with hypoxia: Secondary | ICD-10-CM | POA: Diagnosis not present

## 2019-11-13 DIAGNOSIS — G4733 Obstructive sleep apnea (adult) (pediatric): Secondary | ICD-10-CM | POA: Diagnosis not present

## 2019-11-13 DIAGNOSIS — J449 Chronic obstructive pulmonary disease, unspecified: Secondary | ICD-10-CM | POA: Diagnosis not present

## 2019-11-18 DIAGNOSIS — J449 Chronic obstructive pulmonary disease, unspecified: Secondary | ICD-10-CM | POA: Diagnosis not present

## 2019-11-18 DIAGNOSIS — I10 Essential (primary) hypertension: Secondary | ICD-10-CM | POA: Diagnosis not present

## 2019-11-24 DIAGNOSIS — R6889 Other general symptoms and signs: Secondary | ICD-10-CM | POA: Diagnosis not present

## 2019-11-24 DIAGNOSIS — D472 Monoclonal gammopathy: Secondary | ICD-10-CM | POA: Diagnosis not present

## 2019-11-24 DIAGNOSIS — I10 Essential (primary) hypertension: Secondary | ICD-10-CM | POA: Diagnosis not present

## 2019-11-24 DIAGNOSIS — N1831 Chronic kidney disease, stage 3a: Secondary | ICD-10-CM | POA: Diagnosis not present

## 2019-11-24 DIAGNOSIS — D508 Other iron deficiency anemias: Secondary | ICD-10-CM | POA: Diagnosis not present

## 2019-11-24 DIAGNOSIS — J449 Chronic obstructive pulmonary disease, unspecified: Secondary | ICD-10-CM | POA: Diagnosis not present

## 2019-11-24 DIAGNOSIS — N400 Enlarged prostate without lower urinary tract symptoms: Secondary | ICD-10-CM | POA: Diagnosis not present

## 2020-01-18 DIAGNOSIS — J449 Chronic obstructive pulmonary disease, unspecified: Secondary | ICD-10-CM | POA: Diagnosis not present

## 2020-01-18 DIAGNOSIS — I10 Essential (primary) hypertension: Secondary | ICD-10-CM | POA: Diagnosis not present

## 2020-01-29 DIAGNOSIS — K219 Gastro-esophageal reflux disease without esophagitis: Secondary | ICD-10-CM | POA: Diagnosis not present

## 2020-01-29 DIAGNOSIS — Z9181 History of falling: Secondary | ICD-10-CM | POA: Diagnosis not present

## 2020-01-29 DIAGNOSIS — R778 Other specified abnormalities of plasma proteins: Secondary | ICD-10-CM | POA: Diagnosis not present

## 2020-01-29 DIAGNOSIS — J449 Chronic obstructive pulmonary disease, unspecified: Secondary | ICD-10-CM | POA: Diagnosis not present

## 2020-01-29 DIAGNOSIS — K921 Melena: Secondary | ICD-10-CM | POA: Diagnosis not present

## 2020-01-29 DIAGNOSIS — N183 Chronic kidney disease, stage 3 unspecified: Secondary | ICD-10-CM | POA: Diagnosis not present

## 2020-01-29 DIAGNOSIS — I872 Venous insufficiency (chronic) (peripheral): Secondary | ICD-10-CM | POA: Diagnosis not present

## 2020-01-29 DIAGNOSIS — I10 Essential (primary) hypertension: Secondary | ICD-10-CM | POA: Diagnosis not present

## 2020-01-29 DIAGNOSIS — Z6831 Body mass index (BMI) 31.0-31.9, adult: Secondary | ICD-10-CM | POA: Diagnosis not present

## 2020-02-01 DIAGNOSIS — J449 Chronic obstructive pulmonary disease, unspecified: Secondary | ICD-10-CM | POA: Diagnosis not present

## 2020-02-01 DIAGNOSIS — R0902 Hypoxemia: Secondary | ICD-10-CM | POA: Diagnosis not present

## 2020-02-02 DIAGNOSIS — I8311 Varicose veins of right lower extremity with inflammation: Secondary | ICD-10-CM | POA: Diagnosis not present

## 2020-02-09 DIAGNOSIS — I83009 Varicose veins of unspecified lower extremity with ulcer of unspecified site: Secondary | ICD-10-CM | POA: Diagnosis not present

## 2020-02-15 DIAGNOSIS — I10 Essential (primary) hypertension: Secondary | ICD-10-CM | POA: Diagnosis not present

## 2020-02-15 DIAGNOSIS — I82402 Acute embolism and thrombosis of unspecified deep veins of left lower extremity: Secondary | ICD-10-CM | POA: Diagnosis not present

## 2020-02-20 DIAGNOSIS — I10 Essential (primary) hypertension: Secondary | ICD-10-CM | POA: Diagnosis not present

## 2020-02-20 DIAGNOSIS — J9611 Chronic respiratory failure with hypoxia: Secondary | ICD-10-CM | POA: Diagnosis not present

## 2020-02-20 DIAGNOSIS — J449 Chronic obstructive pulmonary disease, unspecified: Secondary | ICD-10-CM | POA: Diagnosis not present

## 2020-02-20 DIAGNOSIS — G4733 Obstructive sleep apnea (adult) (pediatric): Secondary | ICD-10-CM | POA: Diagnosis not present

## 2020-03-19 DIAGNOSIS — D500 Iron deficiency anemia secondary to blood loss (chronic): Secondary | ICD-10-CM | POA: Diagnosis not present

## 2020-03-19 DIAGNOSIS — K5792 Diverticulitis of intestine, part unspecified, without perforation or abscess without bleeding: Secondary | ICD-10-CM | POA: Diagnosis not present

## 2020-03-19 DIAGNOSIS — K921 Melena: Secondary | ICD-10-CM | POA: Diagnosis not present

## 2020-03-19 DIAGNOSIS — D123 Benign neoplasm of transverse colon: Secondary | ICD-10-CM | POA: Diagnosis not present

## 2020-03-19 DIAGNOSIS — J439 Emphysema, unspecified: Secondary | ICD-10-CM | POA: Diagnosis not present

## 2020-03-19 DIAGNOSIS — Z86010 Personal history of colonic polyps: Secondary | ICD-10-CM | POA: Diagnosis not present

## 2020-03-19 DIAGNOSIS — R195 Other fecal abnormalities: Secondary | ICD-10-CM | POA: Diagnosis not present

## 2020-03-22 DIAGNOSIS — D472 Monoclonal gammopathy: Secondary | ICD-10-CM | POA: Diagnosis not present

## 2020-03-22 DIAGNOSIS — M5417 Radiculopathy, lumbosacral region: Secondary | ICD-10-CM | POA: Diagnosis not present

## 2020-03-22 DIAGNOSIS — Z86718 Personal history of other venous thrombosis and embolism: Secondary | ICD-10-CM | POA: Diagnosis not present

## 2020-03-22 DIAGNOSIS — M5137 Other intervertebral disc degeneration, lumbosacral region: Secondary | ICD-10-CM | POA: Diagnosis not present

## 2020-03-22 DIAGNOSIS — D508 Other iron deficiency anemias: Secondary | ICD-10-CM | POA: Diagnosis not present

## 2020-03-22 DIAGNOSIS — M47817 Spondylosis without myelopathy or radiculopathy, lumbosacral region: Secondary | ICD-10-CM | POA: Diagnosis not present

## 2020-04-02 DIAGNOSIS — D472 Monoclonal gammopathy: Secondary | ICD-10-CM | POA: Diagnosis not present

## 2020-04-02 DIAGNOSIS — Z86718 Personal history of other venous thrombosis and embolism: Secondary | ICD-10-CM | POA: Diagnosis not present

## 2020-04-02 DIAGNOSIS — N1831 Chronic kidney disease, stage 3a: Secondary | ICD-10-CM | POA: Diagnosis not present

## 2020-04-02 DIAGNOSIS — D508 Other iron deficiency anemias: Secondary | ICD-10-CM | POA: Diagnosis not present

## 2020-04-02 DIAGNOSIS — R6889 Other general symptoms and signs: Secondary | ICD-10-CM | POA: Diagnosis not present

## 2020-05-02 DIAGNOSIS — R609 Edema, unspecified: Secondary | ICD-10-CM | POA: Diagnosis not present

## 2020-05-02 DIAGNOSIS — I82401 Acute embolism and thrombosis of unspecified deep veins of right lower extremity: Secondary | ICD-10-CM | POA: Diagnosis not present

## 2020-05-02 DIAGNOSIS — R9431 Abnormal electrocardiogram [ECG] [EKG]: Secondary | ICD-10-CM | POA: Diagnosis not present

## 2020-05-02 DIAGNOSIS — I10 Essential (primary) hypertension: Secondary | ICD-10-CM | POA: Diagnosis not present

## 2020-05-02 DIAGNOSIS — I872 Venous insufficiency (chronic) (peripheral): Secondary | ICD-10-CM | POA: Diagnosis not present

## 2020-05-06 DIAGNOSIS — M5417 Radiculopathy, lumbosacral region: Secondary | ICD-10-CM | POA: Diagnosis not present

## 2020-05-10 DIAGNOSIS — Z6831 Body mass index (BMI) 31.0-31.9, adult: Secondary | ICD-10-CM | POA: Diagnosis not present

## 2020-05-10 DIAGNOSIS — G4730 Sleep apnea, unspecified: Secondary | ICD-10-CM | POA: Diagnosis not present

## 2020-05-10 DIAGNOSIS — N183 Chronic kidney disease, stage 3 unspecified: Secondary | ICD-10-CM | POA: Diagnosis not present

## 2020-05-10 DIAGNOSIS — Z23 Encounter for immunization: Secondary | ICD-10-CM | POA: Diagnosis not present

## 2020-05-10 DIAGNOSIS — K219 Gastro-esophageal reflux disease without esophagitis: Secondary | ICD-10-CM | POA: Diagnosis not present

## 2020-05-10 DIAGNOSIS — I10 Essential (primary) hypertension: Secondary | ICD-10-CM | POA: Diagnosis not present

## 2020-05-10 DIAGNOSIS — I872 Venous insufficiency (chronic) (peripheral): Secondary | ICD-10-CM | POA: Diagnosis not present

## 2020-05-10 DIAGNOSIS — J449 Chronic obstructive pulmonary disease, unspecified: Secondary | ICD-10-CM | POA: Diagnosis not present

## 2020-05-23 DIAGNOSIS — I82401 Acute embolism and thrombosis of unspecified deep veins of right lower extremity: Secondary | ICD-10-CM | POA: Diagnosis not present

## 2020-05-23 DIAGNOSIS — R9431 Abnormal electrocardiogram [ECG] [EKG]: Secondary | ICD-10-CM | POA: Diagnosis not present

## 2020-05-23 DIAGNOSIS — R600 Localized edema: Secondary | ICD-10-CM | POA: Diagnosis not present

## 2020-05-23 DIAGNOSIS — I10 Essential (primary) hypertension: Secondary | ICD-10-CM | POA: Diagnosis not present

## 2020-05-23 DIAGNOSIS — R609 Edema, unspecified: Secondary | ICD-10-CM | POA: Diagnosis not present

## 2020-05-23 DIAGNOSIS — I872 Venous insufficiency (chronic) (peripheral): Secondary | ICD-10-CM | POA: Diagnosis not present

## 2020-05-24 DIAGNOSIS — M5417 Radiculopathy, lumbosacral region: Secondary | ICD-10-CM | POA: Diagnosis not present

## 2020-05-24 DIAGNOSIS — M5137 Other intervertebral disc degeneration, lumbosacral region: Secondary | ICD-10-CM | POA: Diagnosis not present

## 2020-05-24 DIAGNOSIS — M47817 Spondylosis without myelopathy or radiculopathy, lumbosacral region: Secondary | ICD-10-CM | POA: Diagnosis not present
